# Patient Record
Sex: Female | Born: 1935 | Race: White | Hispanic: No | Smoking: Never smoker
Health system: Southern US, Community
[De-identification: ages and names within clinical notes are randomized; demographics above are authoritative.]

## PROBLEM LIST (undated history)

## (undated) DIAGNOSIS — I10 Essential (primary) hypertension: Secondary | ICD-10-CM

## (undated) DIAGNOSIS — K219 Gastro-esophageal reflux disease without esophagitis: Secondary | ICD-10-CM

## (undated) DIAGNOSIS — E8881 Metabolic syndrome: Secondary | ICD-10-CM

## (undated) DIAGNOSIS — H269 Unspecified cataract: Secondary | ICD-10-CM

## (undated) DIAGNOSIS — E039 Hypothyroidism, unspecified: Secondary | ICD-10-CM

## (undated) DIAGNOSIS — E785 Hyperlipidemia, unspecified: Secondary | ICD-10-CM

## (undated) DIAGNOSIS — N6019 Diffuse cystic mastopathy of unspecified breast: Secondary | ICD-10-CM

## (undated) DIAGNOSIS — K8020 Calculus of gallbladder without cholecystitis without obstruction: Secondary | ICD-10-CM

## (undated) DIAGNOSIS — M8580 Other specified disorders of bone density and structure, unspecified site: Secondary | ICD-10-CM

## (undated) DIAGNOSIS — K5790 Diverticulosis of intestine, part unspecified, without perforation or abscess without bleeding: Secondary | ICD-10-CM

## (undated) DIAGNOSIS — F411 Generalized anxiety disorder: Secondary | ICD-10-CM

## (undated) HISTORY — PX: CHOLECYSTECTOMY: SHX55

## (undated) HISTORY — PX: BREAST BIOPSY: SHX20

## (undated) HISTORY — DX: Gastro-esophageal reflux disease without esophagitis: K21.9

## (undated) HISTORY — DX: Calculus of gallbladder without cholecystitis without obstruction: K80.20

## (undated) HISTORY — DX: Metabolic syndrome: E88.810

## (undated) HISTORY — DX: Unspecified cataract: H26.9

## (undated) HISTORY — DX: Metabolic syndrome: E88.81

## (undated) HISTORY — DX: Hypothyroidism, unspecified: E03.9

## (undated) HISTORY — DX: Generalized anxiety disorder: F41.1

## (undated) HISTORY — DX: Other specified disorders of bone density and structure, unspecified site: M85.80

## (undated) HISTORY — PX: BREAST SURGERY: SHX581

## (undated) HISTORY — PX: OTHER SURGICAL HISTORY: SHX169

## (undated) HISTORY — DX: Diverticulosis of intestine, part unspecified, without perforation or abscess without bleeding: K57.90

## (undated) HISTORY — DX: Essential (primary) hypertension: I10

## (undated) HISTORY — DX: Hyperlipidemia, unspecified: E78.5

## (undated) HISTORY — DX: Diffuse cystic mastopathy of unspecified breast: N60.19

---

## 1997-11-26 ENCOUNTER — Encounter: Payer: Self-pay | Admitting: General Surgery

## 1997-11-27 ENCOUNTER — Ambulatory Visit (HOSPITAL_COMMUNITY): Admission: RE | Admit: 1997-11-27 | Discharge: 1997-11-27 | Payer: Self-pay | Admitting: General Surgery

## 1997-11-27 ENCOUNTER — Encounter: Payer: Self-pay | Admitting: General Surgery

## 1999-11-03 ENCOUNTER — Other Ambulatory Visit: Admission: RE | Admit: 1999-11-03 | Discharge: 1999-11-03 | Payer: Self-pay | Admitting: Family Medicine

## 2000-05-05 ENCOUNTER — Encounter: Admission: RE | Admit: 2000-05-05 | Discharge: 2000-05-05 | Payer: Self-pay | Admitting: Family Medicine

## 2000-05-05 ENCOUNTER — Encounter: Payer: Self-pay | Admitting: Family Medicine

## 2001-04-24 ENCOUNTER — Other Ambulatory Visit: Admission: RE | Admit: 2001-04-24 | Discharge: 2001-04-24 | Payer: Self-pay | Admitting: Family Medicine

## 2001-05-07 ENCOUNTER — Encounter: Admission: RE | Admit: 2001-05-07 | Discharge: 2001-05-07 | Payer: Self-pay | Admitting: Family Medicine

## 2001-05-07 ENCOUNTER — Encounter: Payer: Self-pay | Admitting: Family Medicine

## 2001-07-05 ENCOUNTER — Ambulatory Visit (HOSPITAL_COMMUNITY): Admission: RE | Admit: 2001-07-05 | Discharge: 2001-07-05 | Payer: Self-pay | Admitting: Family Medicine

## 2001-07-05 ENCOUNTER — Encounter: Payer: Self-pay | Admitting: Family Medicine

## 2001-08-10 ENCOUNTER — Encounter: Payer: Self-pay | Admitting: General Surgery

## 2001-08-16 ENCOUNTER — Encounter: Payer: Self-pay | Admitting: General Surgery

## 2001-08-16 ENCOUNTER — Observation Stay (HOSPITAL_COMMUNITY): Admission: RE | Admit: 2001-08-16 | Discharge: 2001-08-17 | Payer: Self-pay | Admitting: General Surgery

## 2001-08-16 ENCOUNTER — Encounter (INDEPENDENT_AMBULATORY_CARE_PROVIDER_SITE_OTHER): Payer: Self-pay | Admitting: Specialist

## 2002-06-25 ENCOUNTER — Other Ambulatory Visit: Admission: RE | Admit: 2002-06-25 | Discharge: 2002-06-25 | Payer: Self-pay | Admitting: Family Medicine

## 2002-07-22 ENCOUNTER — Encounter: Admission: RE | Admit: 2002-07-22 | Discharge: 2002-07-22 | Payer: Self-pay | Admitting: Family Medicine

## 2002-07-22 ENCOUNTER — Encounter: Payer: Self-pay | Admitting: Family Medicine

## 2002-08-23 ENCOUNTER — Encounter: Payer: Self-pay | Admitting: Internal Medicine

## 2002-08-23 ENCOUNTER — Ambulatory Visit (HOSPITAL_COMMUNITY): Admission: RE | Admit: 2002-08-23 | Discharge: 2002-08-23 | Payer: Self-pay | Admitting: Internal Medicine

## 2003-07-28 ENCOUNTER — Encounter: Admission: RE | Admit: 2003-07-28 | Discharge: 2003-07-28 | Payer: Self-pay | Admitting: Family Medicine

## 2003-08-25 ENCOUNTER — Other Ambulatory Visit: Admission: RE | Admit: 2003-08-25 | Discharge: 2003-08-25 | Payer: Self-pay | Admitting: Family Medicine

## 2004-03-19 ENCOUNTER — Ambulatory Visit (HOSPITAL_COMMUNITY): Admission: RE | Admit: 2004-03-19 | Discharge: 2004-03-19 | Payer: Self-pay | Admitting: Family Medicine

## 2004-08-30 ENCOUNTER — Other Ambulatory Visit: Admission: RE | Admit: 2004-08-30 | Discharge: 2004-08-30 | Payer: Self-pay | Admitting: Family Medicine

## 2004-10-12 ENCOUNTER — Encounter: Admission: RE | Admit: 2004-10-12 | Discharge: 2004-10-12 | Payer: Self-pay | Admitting: Family Medicine

## 2005-09-19 ENCOUNTER — Other Ambulatory Visit: Admission: RE | Admit: 2005-09-19 | Discharge: 2005-09-19 | Payer: Self-pay | Admitting: Family Medicine

## 2005-10-25 ENCOUNTER — Encounter: Admission: RE | Admit: 2005-10-25 | Discharge: 2005-10-25 | Payer: Self-pay | Admitting: Family Medicine

## 2006-11-01 ENCOUNTER — Encounter: Admission: RE | Admit: 2006-11-01 | Discharge: 2006-11-01 | Payer: Self-pay | Admitting: Family Medicine

## 2007-11-09 ENCOUNTER — Encounter: Admission: RE | Admit: 2007-11-09 | Discharge: 2007-11-09 | Payer: Self-pay | Admitting: Family Medicine

## 2007-11-14 ENCOUNTER — Encounter: Admission: RE | Admit: 2007-11-14 | Discharge: 2007-11-14 | Payer: Self-pay | Admitting: Family Medicine

## 2008-11-27 ENCOUNTER — Encounter: Admission: RE | Admit: 2008-11-27 | Discharge: 2008-11-27 | Payer: Self-pay | Admitting: Family Medicine

## 2009-11-30 ENCOUNTER — Encounter: Admission: RE | Admit: 2009-11-30 | Discharge: 2009-11-30 | Payer: Self-pay | Admitting: Family Medicine

## 2010-02-21 ENCOUNTER — Encounter: Payer: Self-pay | Admitting: Family Medicine

## 2010-06-18 NOTE — Op Note (Signed)
[facility]  Patient:    Janice Zamora, Janice Zamora Visit Number: [id number] MRN: [id number]          Service Type: SUR Location: 3W 0375 02 Attending Physician:  Arlis Porta Dictated by:   Adolph Pollack, M.D. Proc. Date: 08/16/01 Admit Date:  08/16/2001 Discharge Date: 08/17/2001   CC:         Janice Zamora, M.D.   Operative Report  PREOPERATIVE DIAGNOSIS:  Symptomatic cholelithiasis.  POSTOPERATIVE DIAGNOSIS:  Symptomatic cholelithiasis.  PROCEDURE:  Laparoscopic cholecystectomy with intraoperative cholangiogram.  SURGEON:  Adolph Pollack, M.D.  ASSISTANT:  Ollen Gross. Vernell Morgans, M.D.  ANESTHESIA:  General.  INDICATIONS FOR PROCEDURE:  Janice Zamora is a 75 year old female whose been having right upper quadrant pain with low grade fever and nausea. She was felt to have a urinary tract infection, was started on Cipro and also had an ultrasound which demonstrated multiple gallstones and a small renal and hepatic cyst. She still has intermittent pains in her right upper quadrant radiating to the back at times associated with nausea. She now presents for elective cholecystectomy. Liver function tests are normal preoperatively.  TECHNIQUE:  She was placed supine on the operating table and a general anesthetic was administered. Her abdomen was sterilely prepped and draped. Local anesthetic consisting of 0.5% plain Marcaine was infiltrated in the umbilical area and an incision made through the umbilicus and carried down through the subcutaneous tissue. The peritoneal cavity was entered sharply and under direct vision. A pursestring suture of #0 Vicryl was placed around the fascial edges. A Hasson trocar was introduced into the peritoneal cavity and pneumoperitoneum was created by insufflation of CO2 gas. Next the laparoscope was introduced. The patient was then placed in the appropriate position and under direct vision an 11 mm trocar was placed  through an epigastric incision and two 5 mm trocars were placed in the right mid lateral abdomen through small incisions. The fundus of the gallbladder was noted to be pale and it was grasped and retracted toward the right shoulder. Filmy adhesions the infundibulum were taken down. The infundibulum was then completely mobilized. I then isolated the gallbladder cystic duct junction. The cystic duct appeared to be quite short. I created a window around it. I then placed a clip up on the gallbladder proximal to the cystic duct gallbladder junction and made an incision at the cystic duct gallbladder junction. A cholangiocath was passed through the abdominal wall and a cholangiogram was performed.  Using real-time fluoroscopy, dilute contrast material was injected into the cystic duct. It promptly drained into the common bile duct which promptly drained into the duodenum. The common and left hepatic ducts were also noted. The short cystic duct was verified by way of the cholangiogram. The final report is pending the radiologists interpretation.  Next, I removed the cholangiocath, I placed two occlusive clips in the proximal cystic duct and divided it sharply. An anterior and posterior branch of the cystic artery were clipped and divided. The gallbladder was then dissected free from the liver bed using the cautery intact. The liver bed was irrigated, inspected and no bile leakage or bleeding was noted. Irrigation fluid is evacuated.  Under direct vision, the gallbladder was removed through the umbilical incision and the umbilical incision was closed by tightening up and tying down the pursestring suture under direct vision. Omental adhesions from a previous umbilical hernia repair were also noted inferior to this area.  Next, the perihepatic areas were reinspected and again  no bleeding or bile leakage was noted. The trocars were all removed and then the pneumoperitoneum was released. The  skin incisions were closed with 4-0 Monocryl subcuticular stitches. Steri-Strips and sterile dressings were applied.  The patient tolerated the procedure well without any apparent complications and was taken to the recovery room in satisfactory condition. Dictated by:   Adolph Pollack, M.D. Attending Physician:  Arlis Porta DD:  08/16/01 TD:  08/21/01 Job: [phone]

## 2010-11-03 ENCOUNTER — Other Ambulatory Visit: Payer: Self-pay | Admitting: Family Medicine

## 2010-11-03 DIAGNOSIS — Z1231 Encounter for screening mammogram for malignant neoplasm of breast: Secondary | ICD-10-CM

## 2010-12-07 ENCOUNTER — Ambulatory Visit
Admission: RE | Admit: 2010-12-07 | Discharge: 2010-12-07 | Disposition: A | Discharge status: Home / Self Care | Payer: Medicare Other | Source: Ambulatory Visit | Attending: Family Medicine | Admitting: Family Medicine

## 2010-12-07 DIAGNOSIS — Z1231 Encounter for screening mammogram for malignant neoplasm of breast: Secondary | ICD-10-CM

## 2011-11-15 ENCOUNTER — Other Ambulatory Visit: Payer: Self-pay | Admitting: Nurse Practitioner

## 2011-11-15 DIAGNOSIS — Z1231 Encounter for screening mammogram for malignant neoplasm of breast: Secondary | ICD-10-CM

## 2011-12-16 ENCOUNTER — Ambulatory Visit
Admission: RE | Admit: 2011-12-16 | Discharge: 2011-12-16 | Disposition: A | Discharge status: Home / Self Care | Payer: Medicare Other | Source: Ambulatory Visit | Attending: Nurse Practitioner | Admitting: Nurse Practitioner

## 2011-12-16 DIAGNOSIS — Z1231 Encounter for screening mammogram for malignant neoplasm of breast: Secondary | ICD-10-CM

## 2012-03-21 ENCOUNTER — Encounter: Payer: Self-pay | Admitting: Internal Medicine

## 2012-04-11 ENCOUNTER — Other Ambulatory Visit (INDEPENDENT_AMBULATORY_CARE_PROVIDER_SITE_OTHER): Payer: Medicare Other

## 2012-04-11 ENCOUNTER — Other Ambulatory Visit: Payer: Self-pay

## 2012-04-11 ENCOUNTER — Ambulatory Visit (INDEPENDENT_AMBULATORY_CARE_PROVIDER_SITE_OTHER): Payer: Medicare Other | Admitting: Internal Medicine

## 2012-04-11 ENCOUNTER — Encounter: Payer: Self-pay | Admitting: Internal Medicine

## 2012-04-11 VITALS — BP 124/60 | HR 76 | Ht 62.0 in | Wt 166.4 lb

## 2012-04-11 DIAGNOSIS — R195 Other fecal abnormalities: Secondary | ICD-10-CM

## 2012-04-11 DIAGNOSIS — K219 Gastro-esophageal reflux disease without esophagitis: Secondary | ICD-10-CM

## 2012-04-11 LAB — CBC WITH DIFFERENTIAL/PLATELET
Basophils Relative: 0.7 % (ref 0.0–3.0)
Lymphocytes Relative: 21.5 % (ref 12.0–46.0)
Lymphs Abs: 1.4 10*3/uL (ref 0.7–4.0)
MCV: 93.2 fl (ref 78.0–100.0)
Monocytes Absolute: 0.5 10*3/uL (ref 0.1–1.0)
Monocytes Relative: 8.2 % (ref 3.0–12.0)
Neutro Abs: 4.5 10*3/uL (ref 1.4–7.7)
Neutrophils Relative %: 68.7 % (ref 43.0–77.0)
RBC: 4.31 Mil/uL (ref 3.87–5.11)
WBC: 6.6 10*3/uL (ref 4.5–10.5)

## 2012-04-11 MED ORDER — MOVIPREP 100 G PO SOLR: 1.0000 | Freq: Once | ORAL | Status: DC

## 2012-04-11 MED ORDER — METOCLOPRAMIDE HCL 10 MG PO TABS: ORAL_TABLET | ORAL | Status: DC

## 2012-04-11 NOTE — Progress Notes (Signed)
HISTORY OF PRESENT ILLNESS:  Janice Zamora is a 77 y.o. female with the below listed medical history who presents today regarding Hemoccult-positive stool. She has a history of GERD for which she takes PPI. On PPI no symptoms. Off PPI GERD symptoms. She underwent routine evaluation with her primary care team. As part of that, Hemoccult study was admitted and returned positive 03/14/2012. Other laboratories were unremarkable except for lipids. No CBC obtained. The patient rarely uses NSAIDs. Occasional red blood with wiping and straining. GI review of systems is otherwise negative. She did undergo routine colonoscopy to evaluate hematochezia in July 2004. The examination was incomplete as the scope was unable to pass the sigmoid region. Internal hemorrhoids noted. Subsequent barium enema revealed sigmoid diverticulosis and tortuosity of the sigmoid and splenic flexure regions. The patient's chronic medical problems are stable. Outside studies and office notes have been reviewed. The patient is accompanied today by her daughter. Previous endoscopy and radiology reports also reviewed. She is status post cholecystectomy since her last visit  REVIEW OF SYSTEMS:  All non-GI ROS negative after extensive review  Past Medical History  Diagnosis Date  . Hypertension   . Hypothyroidism   . GERD (gastroesophageal reflux disease)   . Fibrocystic breast disease   . Hyperlipidemia   . Metabolic syndrome   . GAD (generalized anxiety disorder)   . Diverticulosis   . Cholelithiasis     Past Surgical History  Procedure Laterality Date  . Cholecystectomy      Social History Janice Zamora  reports that she has never smoked. She has never used smokeless tobacco. She reports that she does not drink alcohol or use illicit drugs.  family history includes Hypertension in her father and mother.  No Known Allergies     PHYSICAL EXAMINATION: Vital signs: BP 124/60  Pulse 76  Ht 5\' 2"  (1.575 m)  Wt 166  lb 6 oz (75.467 kg)  BMI 30.42 kg/m2  Constitutional: generally well-appearing, no acute distress Psychiatric: alert and oriented x3, cooperative Eyes: extraocular movements intact, anicteric, conjunctiva pink Mouth: oral pharynx moist, no lesions Neck: supple no lymphadenopathy Cardiovascular: heart regular rate and rhythm, no murmur Lungs: clear to auscultation bilaterally Abdomen: soft, nontender, nondistended, no obvious ascites, no peritoneal signs, normal bowel sounds, no organomegaly Rectal: Deferred until colonoscopy Extremities: no lower extremity edema bilaterally Skin: no lesions on visible extremities Neuro: No focal deficits.     ASSESSMENT:  #1. Hemoccult-positive stool #2. GERD. Symptoms controlled with PPI #3. General medical problems. Stable   PLAN:  #1. Colonoscopy. This to evaluate Hemoccult-positive stool. As well, 10 years since her last exam. Would use pediatric colonoscope given prior difficulties.The nature of the procedure, as well as the risks, benefits, and alternatives were carefully and thoroughly reviewed with the patient. Ample time for discussion and questions allowed. The patient understood, was satisfied, and agreed to proceed. #2. Movi prep prescribed. The patient instructed on its use #3. Obtain CBC today. If anemic, will likely need upper endoscopy. #4. Continue PPI and reflux precautions

## 2012-04-11 NOTE — Patient Instructions (Addendum)
You have been scheduled for a colonoscopy with propofol. Please follow written instructions given to you at your visit today.  Please pick up your prep kit at the pharmacy within the next 1-3 days. If you use inhalers (even only as needed), please bring them with you on the day of your procedure.   Your physician has requested that you go to the basement for the following lab work before leaving today:  cbc

## 2012-04-17 ENCOUNTER — Other Ambulatory Visit: Payer: Self-pay | Admitting: Internal Medicine

## 2012-04-17 ENCOUNTER — Ambulatory Visit (AMBULATORY_SURGERY_CENTER): Payer: Medicare Other | Admitting: Internal Medicine

## 2012-04-17 ENCOUNTER — Encounter: Payer: Self-pay | Admitting: Internal Medicine

## 2012-04-17 VITALS — BP 127/54 | HR 74 | Temp 97.8°F | Resp 14 | Ht 62.0 in | Wt 166.0 lb

## 2012-04-17 DIAGNOSIS — D128 Benign neoplasm of rectum: Secondary | ICD-10-CM

## 2012-04-17 DIAGNOSIS — D126 Benign neoplasm of colon, unspecified: Secondary | ICD-10-CM

## 2012-04-17 DIAGNOSIS — K5730 Diverticulosis of large intestine without perforation or abscess without bleeding: Secondary | ICD-10-CM

## 2012-04-17 DIAGNOSIS — R195 Other fecal abnormalities: Secondary | ICD-10-CM

## 2012-04-17 DIAGNOSIS — D129 Benign neoplasm of anus and anal canal: Secondary | ICD-10-CM

## 2012-04-17 DIAGNOSIS — K56609 Unspecified intestinal obstruction, unspecified as to partial versus complete obstruction: Secondary | ICD-10-CM

## 2012-04-17 MED ORDER — SODIUM CHLORIDE 0.9 % IV SOLN: 500.0000 mL | INTRAVENOUS | Status: DC

## 2012-04-17 NOTE — Op Note (Signed)
 [facility] [address] [place], [address]   COLONOSCOPY PROCEDURE REPORT  PATIENT: Janice, Zamora  MR#: [id number] BIRTHDATE: [birth year] , 77  yrs. old GENDER: Female ENDOSCOPIST: Roxy Cedar, MD REFERRED UU:VOZD Sheron Nightingale, N.P. PROCEDURE DATE:  04/17/2012 PROCEDURE:   Colonoscopy with snare polypectomy    x 1 ASA CLASS:   Class II INDICATIONS:heme-positive stool.   Incomplete exam followed by BE 2004 MEDICATIONS: MAC sedation, administered by CRNA and propofol (Diprivan) 300mg  IV  DESCRIPTION OF PROCEDURE:   After the risks benefits and alternatives of the procedure were thoroughly explained, informed consent was obtained.  A digital rectal exam revealed no abnormalities of the rectum.   The LB [id number]-[id number]  endoscope was introduced through the anus and advanced to the sigmoid colon. Limited by a stricture.   The quality of the prep was excellent, using MoviPrep  The instrument was then slowly withdrawn as the colon was fully examined.      COLON FINDINGS: A single polyp measuring 8 mm in size was found in the rectum.  A polypectomy was performed using snare cautery.  The resection was complete and the polyp tissue was completely retrieved.   Moderate diverticulosis was noted in the sigmoid colon. THERE WAS MARKED STENSOSIS OF THE RECTOSIGMOID JUNCTION THAT WOULD NOT ALLOW PASSAGE OF THE PEDIATRIC COLONOSCOPE OR THE UPPER ENDOSCOPE. Retroflexed views revealed no abnormalities. The time to cecum=  .  Withdrawal time=  .  The scope was withdrawn and the procedure completed. COMPLICATIONS: There were no complications.  ENDOSCOPIC IMPRESSION: 1.   Single polyp measuring 8 mm in size was found in the rectum; polypectomy was performed using snare cautery 2.   Moderate diverticulosis was noted in the sigmoid colon 3. INCOMPLETE EXAM SECONDARY TO RECTOSIGMOID STENOSIS  RECOMMENDATIONS: 1. Await pathology results 2. MY OFFICE WILL ARRANGE A  VIRTUAL COLONOSCOPY TO EVALUATE UNVISUALIZED COLON "HEME + STOOL, SIGMOID OBSTRUCTION"   eSigned:  Roxy Cedar, MD 04/17/2012 10:44 AM   cc: Bennie Pierini, NP and The Patient

## 2012-04-17 NOTE — Patient Instructions (Addendum)

## 2012-04-17 NOTE — Progress Notes (Signed)
Called to room to assist during endoscopic procedure.  Patient ID and intended procedure confirmed with present staff. Received instructions for my participation in the procedure from the performing physician.  

## 2012-04-17 NOTE — Progress Notes (Signed)
Patient did not experience any of the following events: a burn prior to discharge; a fall within the facility; wrong site/side/patient/procedure/implant event; or a hospital transfer or hospital admission upon discharge from the facility. (G8907) Patient did not have preoperative order for IV antibiotic SSI prophylaxis. (G8918)  

## 2012-04-18 ENCOUNTER — Telehealth: Payer: Self-pay

## 2012-04-18 NOTE — Telephone Encounter (Signed)
  Follow up Call-  Call back number 04/17/2012  Post procedure Call Back phone  # [phone]  Permission to leave phone message Yes     Patient questions:  Do you have a fever, pain , or abdominal swelling? no Pain Score  0 *  Have you tolerated food without any problems? yes  Have you been able to return to your normal activities? yes  Do you have any questions about your discharge instructions: Diet   no Medications  yes Follow up visit  no  Do you have questions or concerns about your Care? no  Actions: * If pain score is 4 or above: No action needed, pain <4.

## 2012-04-23 ENCOUNTER — Encounter: Payer: Self-pay | Admitting: Internal Medicine

## 2012-05-02 ENCOUNTER — Ambulatory Visit
Admission: RE | Admit: 2012-05-02 | Discharge: 2012-05-02 | Disposition: A | Discharge status: Home / Self Care | Payer: Medicare Other | Source: Ambulatory Visit | Attending: Internal Medicine | Admitting: Internal Medicine

## 2012-05-02 DIAGNOSIS — K56609 Unspecified intestinal obstruction, unspecified as to partial versus complete obstruction: Secondary | ICD-10-CM

## 2012-07-03 ENCOUNTER — Ambulatory Visit (INDEPENDENT_AMBULATORY_CARE_PROVIDER_SITE_OTHER): Payer: Medicare Other | Admitting: Nurse Practitioner

## 2012-07-03 ENCOUNTER — Encounter: Payer: Self-pay | Admitting: Nurse Practitioner

## 2012-07-03 VITALS — BP 134/69 | HR 59 | Temp 97.0°F | Ht 62.0 in | Wt 163.0 lb

## 2012-07-03 DIAGNOSIS — F411 Generalized anxiety disorder: Secondary | ICD-10-CM

## 2012-07-03 DIAGNOSIS — E039 Hypothyroidism, unspecified: Secondary | ICD-10-CM

## 2012-07-03 DIAGNOSIS — E785 Hyperlipidemia, unspecified: Secondary | ICD-10-CM

## 2012-07-03 DIAGNOSIS — K219 Gastro-esophageal reflux disease without esophagitis: Secondary | ICD-10-CM

## 2012-07-03 DIAGNOSIS — I10 Essential (primary) hypertension: Secondary | ICD-10-CM

## 2012-07-03 LAB — COMPLETE METABOLIC PANEL WITH GFR
ALT: 21 U/L (ref 0–35)
CO2: 27 mEq/L (ref 19–32)
Calcium: 9.9 mg/dL (ref 8.4–10.5)
Chloride: 100 mEq/L (ref 96–112)
GFR, Est African American: 67 mL/min
Potassium: 4.1 mEq/L (ref 3.5–5.3)
Sodium: 137 mEq/L (ref 135–145)
Total Protein: 6.9 g/dL (ref 6.0–8.3)

## 2012-07-03 LAB — THYROID PANEL WITH TSH
Free Thyroxine Index: 3 (ref 1.0–3.9)
T4, Total: 9.7 ug/dL (ref 5.0–12.5)
TSH: 1.701 u[IU]/mL (ref 0.350–4.500)

## 2012-07-03 MED ORDER — ALPRAZOLAM 0.5 MG PO TABS: 0.5000 mg | ORAL_TABLET | Freq: Two times a day (BID) | ORAL | Status: DC | PRN

## 2012-07-03 MED ORDER — ATENOLOL 50 MG PO TABS: 50.0000 mg | ORAL_TABLET | Freq: Every day | ORAL | Status: DC

## 2012-07-03 MED ORDER — ENALAPRIL-HYDROCHLOROTHIAZIDE 10-25 MG PO TABS: 1.0000 | ORAL_TABLET | Freq: Every day | ORAL | Status: DC

## 2012-07-03 MED ORDER — SIMVASTATIN 40 MG PO TABS: 40.0000 mg | ORAL_TABLET | Freq: Every evening | ORAL | Status: DC

## 2012-07-03 MED ORDER — OMEPRAZOLE 20 MG PO CPDR: 20.0000 mg | DELAYED_RELEASE_CAPSULE | Freq: Every day | ORAL | Status: DC

## 2012-07-03 NOTE — Patient Instructions (Signed)

## 2012-07-03 NOTE — Progress Notes (Signed)
Subjective:    Patient ID: Janice Zamora, female    DOB: [birth year], 77 y.o.   MRN: [id number]  Hypertension This is a chronic problem. The current episode started more than 1 year ago. The problem is controlled. Associated symptoms include anxiety. Pertinent negatives include no blurred vision, chest pain, headaches, malaise/fatigue, neck pain, palpitations, peripheral edema or shortness of breath. There are no associated agents to hypertension. Risk factors for coronary artery disease include dyslipidemia, obesity, post-menopausal state and sedentary lifestyle. Past treatments include ACE inhibitors, diuretics and beta blockers. The current treatment provides significant improvement. Compliance problems include diet and exercise.  Hypertensive end-organ damage includes a thyroid problem.  Hyperlipidemia This is a chronic problem. The current episode started more than 1 year ago. The problem is uncontrolled. Recent lipid tests were reviewed and are high. Exacerbating diseases include hypothyroidism and obesity. There are no known factors aggravating her hyperlipidemia. Pertinent negatives include no chest pain or shortness of breath. Current antihyperlipidemic treatment includes statins. The current treatment provides moderate improvement of lipids. Compliance problems include adherence to diet and adherence to exercise.  Risk factors for coronary artery disease include hypertension, obesity, post-menopausal and a sedentary lifestyle.  Thyroid Problem Presents for follow-up visit. Symptoms include anxiety and dry skin. Patient reports no constipation, depressed mood, fatigue, hair loss, palpitations, weight gain or weight loss. The symptoms have been stable. Her past medical history is significant for hyperlipidemia.  GAD Xanax takes prn-2-3X per week mainly at night when she can't sleep   Review of Systems  Constitutional: Negative for weight loss, weight gain, malaise/fatigue and fatigue.  HENT:  Negative for neck pain.   Eyes: Negative for blurred vision.  Respiratory: Negative for shortness of breath.   Cardiovascular: Negative for chest pain and palpitations.  Gastrointestinal: Negative for constipation.  Neurological: Negative for headaches.  All other systems reviewed and are negative.       Objective:   Physical Exam  Constitutional: She is oriented to person, place, and time. She appears well-developed and well-nourished.  HENT:  Nose: Nose normal.  Mouth/Throat: Oropharynx is clear and moist.  Eyes: EOM are normal.  Neck: Trachea normal, normal range of motion and full passive range of motion without pain. Neck supple. No JVD present. Carotid bruit is not present. No thyromegaly present.  Cardiovascular: Normal rate, regular rhythm, normal heart sounds and intact distal pulses.  Exam reveals no gallop and no friction rub.   No murmur heard. Pulmonary/Chest: Effort normal and breath sounds normal.  Abdominal: Soft. Bowel sounds are normal. She exhibits no distension and no mass. There is no tenderness.  Musculoskeletal: Normal range of motion.  Lymphadenopathy:    She has no cervical adenopathy.  Neurological: She is alert and oriented to person, place, and time. She has normal reflexes.  Skin: Skin is warm and dry.  Psychiatric: She has a normal mood and affect. Her behavior is normal. Judgment and thought content normal.    BP 134/69  Pulse 59  Temp(Src) 97 F (36.1 C) (Oral)  Ht 5\' 2"  (1.575 m)  Wt 163 lb (73.936 kg)  BMI 29.81 kg/m2       Assessment & Plan:  1. Hypertension Low NA+ diet - atenolol (TENORMIN) 50 MG tablet; Take 1 tablet (50 mg total) by mouth daily.  Dispense: 30 tablet; Refill: 5 - enalapril-hydrochlorothiazide (VASERETIC) 10-25 MG per tablet; Take 1 tablet by mouth daily.  Dispense: 30 tablet; Refill: 5 - COMPLETE METABOLIC PANEL WITH GFR  2. Hypothyroidism  -  Thyroid Panel With TSH  3. GERD (gastroesophageal reflux  disease) Avoid spicy and fatty foods - omeprazole (PRILOSEC) 20 MG capsule; Take 1 capsule (20 mg total) by mouth daily.  Dispense: 30 capsule; Refill: 5  4. Hyperlipidemia Low fat diet and exercise - simvastatin (ZOCOR) 40 MG tablet; Take 1 tablet (40 mg total) by mouth every evening.  Dispense: 30 tablet; Refill: 5 - NMR Lipoprofile with Lipids  5. GAD (generalized anxiety disorder) Stress management - ALPRAZolam (XANAX) 0.5 MG tablet; Take 1 tablet (0.5 mg total) by mouth 2 (two) times daily as needed.  Dispense: 60 tablet; Refill: 0   Mary-Margaret Daphine Deutscher, FNP

## 2012-07-04 ENCOUNTER — Other Ambulatory Visit: Payer: Self-pay | Admitting: Nurse Practitioner

## 2012-07-04 LAB — NMR LIPOPROFILE WITH LIPIDS
Cholesterol, Total: 158 mg/dL (ref <200)
HDL Particle Number: 34.6 umol/L (ref >=30.5)
LDL Size: 19.9 nm — ABNORMAL LOW (ref >20.5)
Large HDL-P: <1.3 umol/L — ABNORMAL LOW (ref >=4.8)
Small LDL Particle Number: 1060 nmol/L — ABNORMAL HIGH (ref <=527)
Triglycerides: 198 mg/dL — ABNORMAL HIGH (ref <150)
VLDL Size: 45.5 nm (ref <=46.6)

## 2012-07-04 MED ORDER — ATORVASTATIN CALCIUM 40 MG PO TABS: 40.0000 mg | ORAL_TABLET | Freq: Every day | ORAL | Status: DC

## 2012-09-26 ENCOUNTER — Encounter: Payer: Self-pay | Admitting: *Deleted

## 2012-09-28 ENCOUNTER — Telehealth: Payer: Self-pay | Admitting: Nurse Practitioner

## 2012-10-04 NOTE — Telephone Encounter (Signed)
Spoke with patient and appt has already been made

## 2012-10-10 ENCOUNTER — Ambulatory Visit: Payer: Medicare Other | Admitting: Nurse Practitioner

## 2012-10-16 ENCOUNTER — Ambulatory Visit: Payer: Medicare Other | Admitting: Nurse Practitioner

## 2012-11-02 ENCOUNTER — Ambulatory Visit (INDEPENDENT_AMBULATORY_CARE_PROVIDER_SITE_OTHER): Payer: Medicare Other | Admitting: Nurse Practitioner

## 2012-11-02 ENCOUNTER — Encounter: Payer: Self-pay | Admitting: Nurse Practitioner

## 2012-11-02 VITALS — BP 122/65 | HR 62 | Temp 97.7°F | Ht 62.0 in | Wt 166.0 lb

## 2012-11-02 DIAGNOSIS — I10 Essential (primary) hypertension: Secondary | ICD-10-CM

## 2012-11-02 DIAGNOSIS — E785 Hyperlipidemia, unspecified: Secondary | ICD-10-CM

## 2012-11-02 DIAGNOSIS — E039 Hypothyroidism, unspecified: Secondary | ICD-10-CM

## 2012-11-02 DIAGNOSIS — Z23 Encounter for immunization: Secondary | ICD-10-CM

## 2012-11-02 DIAGNOSIS — K219 Gastro-esophageal reflux disease without esophagitis: Secondary | ICD-10-CM

## 2012-11-02 DIAGNOSIS — F411 Generalized anxiety disorder: Secondary | ICD-10-CM

## 2012-11-02 MED ORDER — OMEPRAZOLE 20 MG PO CPDR: 20.0000 mg | DELAYED_RELEASE_CAPSULE | Freq: Every day | ORAL | Status: DC

## 2012-11-02 MED ORDER — LEVOTHYROXINE SODIUM 50 MCG PO TABS: 50.0000 ug | ORAL_TABLET | Freq: Every day | ORAL | Status: DC

## 2012-11-02 MED ORDER — ENALAPRIL-HYDROCHLOROTHIAZIDE 10-25 MG PO TABS: 1.0000 | ORAL_TABLET | Freq: Every day | ORAL | Status: DC

## 2012-11-02 MED ORDER — ATENOLOL 50 MG PO TABS: 50.0000 mg | ORAL_TABLET | Freq: Every day | ORAL | Status: DC

## 2012-11-02 MED ORDER — ATORVASTATIN CALCIUM 40 MG PO TABS: 40.0000 mg | ORAL_TABLET | Freq: Every day | ORAL | Status: DC

## 2012-11-02 NOTE — Patient Instructions (Addendum)

## 2012-11-02 NOTE — Progress Notes (Signed)
Subjective:    Patient ID: Janice Zamora, female    DOB: 1935-05-01, 77 y.o.   MRN: [id number]  Hypertension This is a chronic problem. The current episode started more than 1 year ago. The problem has been resolved since onset. The problem is controlled. Associated symptoms include anxiety. Pertinent negatives include no blurred vision, chest pain, headaches, malaise/fatigue, neck pain, palpitations, peripheral edema or shortness of breath. There are no associated agents to hypertension. Risk factors for coronary artery disease include dyslipidemia, obesity, post-menopausal state and sedentary lifestyle. Past treatments include ACE inhibitors, diuretics and beta blockers. The current treatment provides significant improvement. Compliance problems include diet and exercise.  Hypertensive end-organ damage includes a thyroid problem. There is no history of retinopathy. There is no history of sleep apnea.  Hyperlipidemia This is a chronic problem. The current episode started more than 1 year ago. The problem is uncontrolled. Recent lipid tests were reviewed and are high. Exacerbating diseases include hypothyroidism and obesity. There are no known factors aggravating her hyperlipidemia. Pertinent negatives include no chest pain or shortness of breath. Current antihyperlipidemic treatment includes statins (changed from zocor to lipitor at last visit.). The current treatment provides moderate improvement of lipids. Compliance problems include adherence to diet and adherence to exercise.  Risk factors for coronary artery disease include hypertension, obesity, post-menopausal, a sedentary lifestyle and dyslipidemia.  Thyroid Problem Presents for follow-up visit. Symptoms include anxiety, dry skin and fatigue. Patient reports no constipation, depressed mood, hair loss, palpitations, weight gain or weight loss. The symptoms have been stable. Past treatments include levothyroxine. The treatment provided moderate  relief. Her past medical history is significant for hyperlipidemia.  GAD Xanax takes prn-2-3X per week mainly at night when she can't sleep   Review of Systems  Constitutional: Positive for fatigue. Negative for weight loss, weight gain and malaise/fatigue.  HENT: Negative for neck pain.   Eyes: Negative for blurred vision.  Respiratory: Negative for shortness of breath.   Cardiovascular: Negative for chest pain and palpitations.  Gastrointestinal: Negative for constipation.  Neurological: Negative for headaches.  All other systems reviewed and are negative.       Objective:   Physical Exam  Vitals reviewed. Constitutional: She is oriented to person, place, and time. She appears well-developed and well-nourished.  HENT:  Nose: Nose normal.  Mouth/Throat: Oropharynx is clear and moist.  Eyes: EOM are normal.  Neck: Trachea normal, normal range of motion and full passive range of motion without pain. Neck supple. No JVD present. Carotid bruit is not present. No thyromegaly present.  Cardiovascular: Normal rate, regular rhythm, normal heart sounds and intact distal pulses.  Exam reveals no gallop and no friction rub.   No murmur heard. Pulmonary/Chest: Effort normal and breath sounds normal.  Abdominal: Soft. Bowel sounds are normal. She exhibits no distension and no mass. There is no tenderness.  Musculoskeletal: Normal range of motion.  Lymphadenopathy:    She has no cervical adenopathy.  Neurological: She is alert and oriented to person, place, and time. She has normal reflexes.  Skin: Skin is warm and dry.  Psychiatric: She has a normal mood and affect. Her behavior is normal. Judgment and thought content normal.    BP 122/65  Pulse 62  Temp(Src) 97.7 F (36.5 C) (Oral)  Ht 5\' 2"  (1.575 m)  Wt 166 lb (75.297 kg)  BMI 30.35 kg/m2       Assessment & Plan:   1. Hypertension   2. GERD (gastroesophageal reflux disease)   3.  Hypothyroidism   4. Hyperlipidemia   5.  GAD (generalized anxiety disorder)    Orders Placed This Encounter  Procedures  . CMP14+EGFR  . NMR, lipoprofile   Meds ordered this encounter  Medications  . atorvastatin (LIPITOR) 40 MG tablet    Sig: Take 1 tablet (40 mg total) by mouth daily.    Dispense:  30 tablet    Refill:  3    Order Specific Question:  Supervising Provider    Answer:  Ernestina Penna [[id number]]  . atenolol (TENORMIN) 50 MG tablet    Sig: Take 1 tablet (50 mg total) by mouth daily.    Dispense:  30 tablet    Refill:  5    Order Specific Question:  Supervising Provider    Answer:  Ernestina Penna [[id number]]  . levothyroxine (SYNTHROID) 50 MCG tablet    Sig: Take 1 tablet (50 mcg total) by mouth daily. Take 11/2 tablets daily    Dispense:  45 tablet    Refill:  5    Order Specific Question:  Supervising Provider    Answer:  Ernestina Penna [[id number]]  . omeprazole (PRILOSEC) 20 MG capsule    Sig: Take 1 capsule (20 mg total) by mouth daily.    Dispense:  30 capsule    Refill:  5    Order Specific Question:  Supervising Provider    Answer:  Ernestina Penna [[id number]]  . enalapril-hydrochlorothiazide (VASERETIC) 10-25 MG per tablet    Sig: Take 1 tablet by mouth daily.    Dispense:  30 tablet    Refill:  5    Order Specific Question:  Supervising Provider    Answer:  Deborra Medina    Continue all meds Labs pending Diet and exercise encouraged Health maintenance reviewed Follow up in 3 months Flu shot today- refuses all other immunizations  Mary-Margaret Daphine Deutscher, FNP

## 2012-11-03 LAB — CMP14+EGFR
ALT: 24 IU/L (ref 0–32)
Albumin: 4.7 g/dL (ref 3.5–4.8)
BUN: 18 mg/dL (ref 8–27)
CO2: 26 mmol/L (ref 18–29)
Calcium: 10.1 mg/dL (ref 8.6–10.2)
Chloride: 95 mmol/L — ABNORMAL LOW (ref 97–108)
GFR calc Af Amer: 61 mL/min/{1.73_m2} (ref >59)
Glucose: 127 mg/dL — ABNORMAL HIGH (ref 65–99)
Potassium: 4.3 mmol/L (ref 3.5–5.2)
Total Bilirubin: 0.5 mg/dL (ref 0.0–1.2)
Total Protein: 6.8 g/dL (ref 6.0–8.5)

## 2012-11-03 LAB — NMR, LIPOPROFILE
HDL Cholesterol by NMR: 39 mg/dL — ABNORMAL LOW (ref >=40)
LDL Particle Number: 1563 nmol/L — ABNORMAL HIGH (ref <1000)
LDL Size: 19.8 nm — ABNORMAL LOW (ref >20.5)
LDLC SERPL CALC-MCNC: 77 mg/dL (ref <100)
Small LDL Particle Number: 1185 nmol/L — ABNORMAL HIGH (ref <=527)
Triglycerides by NMR: 176 mg/dL — ABNORMAL HIGH (ref <150)

## 2012-11-05 ENCOUNTER — Other Ambulatory Visit: Payer: Self-pay | Admitting: Nurse Practitioner

## 2012-11-05 MED ORDER — EZETIMIBE 10 MG PO TABS: 10.0000 mg | ORAL_TABLET | Freq: Every day | ORAL | Status: DC

## 2012-11-07 ENCOUNTER — Other Ambulatory Visit: Payer: Self-pay | Admitting: Family Medicine

## 2012-11-14 ENCOUNTER — Telehealth: Payer: Self-pay | Admitting: Nurse Practitioner

## 2012-11-15 NOTE — Telephone Encounter (Signed)
LM on vm, atorvastatin sent in. Call if further questions.

## 2012-11-16 ENCOUNTER — Other Ambulatory Visit: Payer: Self-pay

## 2012-11-16 DIAGNOSIS — Z1231 Encounter for screening mammogram for malignant neoplasm of breast: Secondary | ICD-10-CM

## 2012-12-21 ENCOUNTER — Ambulatory Visit
Admission: RE | Admit: 2012-12-21 | Discharge: 2012-12-21 | Disposition: A | Discharge status: Home / Self Care | Payer: Medicare Other | Source: Ambulatory Visit

## 2012-12-21 DIAGNOSIS — Z1231 Encounter for screening mammogram for malignant neoplasm of breast: Secondary | ICD-10-CM

## 2013-01-04 ENCOUNTER — Other Ambulatory Visit: Payer: Self-pay | Admitting: Nurse Practitioner

## 2013-01-09 ENCOUNTER — Telehealth: Payer: Self-pay | Admitting: Nurse Practitioner

## 2013-01-09 DIAGNOSIS — F411 Generalized anxiety disorder: Secondary | ICD-10-CM

## 2013-01-09 MED ORDER — ALPRAZOLAM 0.5 MG PO TABS: 0.5000 mg | ORAL_TABLET | Freq: Two times a day (BID) | ORAL | Status: DC | PRN

## 2013-01-09 NOTE — Telephone Encounter (Signed)
Please call in xanax 0.5mg  1 po BID prn #[phone] refills

## 2013-01-09 NOTE — Telephone Encounter (Signed)
Refill request

## 2013-01-10 NOTE — Telephone Encounter (Signed)
LM,script called in.

## 2013-02-04 ENCOUNTER — Other Ambulatory Visit: Payer: Self-pay | Admitting: Nurse Practitioner

## 2013-02-19 ENCOUNTER — Encounter: Payer: Self-pay | Admitting: Nurse Practitioner

## 2013-02-19 ENCOUNTER — Ambulatory Visit (INDEPENDENT_AMBULATORY_CARE_PROVIDER_SITE_OTHER): Payer: Medicare Other | Admitting: Nurse Practitioner

## 2013-02-19 VITALS — BP 134/67 | HR 64 | Temp 96.8°F | Ht 62.0 in | Wt 168.0 lb

## 2013-02-19 DIAGNOSIS — E785 Hyperlipidemia, unspecified: Secondary | ICD-10-CM

## 2013-02-19 DIAGNOSIS — K219 Gastro-esophageal reflux disease without esophagitis: Secondary | ICD-10-CM

## 2013-02-19 DIAGNOSIS — R7309 Other abnormal glucose: Secondary | ICD-10-CM

## 2013-02-19 DIAGNOSIS — F411 Generalized anxiety disorder: Secondary | ICD-10-CM

## 2013-02-19 DIAGNOSIS — R739 Hyperglycemia, unspecified: Secondary | ICD-10-CM

## 2013-02-19 DIAGNOSIS — E039 Hypothyroidism, unspecified: Secondary | ICD-10-CM

## 2013-02-19 DIAGNOSIS — I10 Essential (primary) hypertension: Secondary | ICD-10-CM

## 2013-02-19 MED ORDER — ATORVASTATIN CALCIUM 40 MG PO TABS: 40.0000 mg | ORAL_TABLET | Freq: Every day | ORAL | Status: DC

## 2013-02-19 NOTE — Progress Notes (Addendum)
Subjective:    Patient ID: Janice Zamora, female    DOB: 01-Jan-1936, 78 y.o.   MRN: [id number]   Patient here today for follow up- she is doing well without complaints  Hypertension This is a chronic problem. The current episode started more than 1 year ago. The problem has been resolved since onset. The problem is controlled. Associated symptoms include anxiety. Pertinent negatives include no blurred vision, chest pain, headaches, malaise/fatigue, neck pain, palpitations, peripheral edema or shortness of breath. There are no associated agents to hypertension. Risk factors for coronary artery disease include dyslipidemia, obesity, post-menopausal state and sedentary lifestyle. Past treatments include ACE inhibitors, diuretics and beta blockers. The current treatment provides significant improvement. Compliance problems include diet and exercise.  Hypertensive end-organ damage includes a thyroid problem. There is no history of retinopathy. There is no history of sleep apnea.  Hyperlipidemia This is a chronic problem. The current episode started more than 1 year ago. The problem is uncontrolled. Recent lipid tests were reviewed and are high. Exacerbating diseases include hypothyroidism and obesity. There are no known factors aggravating her hyperlipidemia. Pertinent negatives include no chest pain or shortness of breath. Current antihyperlipidemic treatment includes statins (changed from zocor to lipitor at last visit.). The current treatment provides moderate improvement of lipids. Compliance problems include adherence to diet and adherence to exercise.  Risk factors for coronary artery disease include hypertension, obesity, post-menopausal, a sedentary lifestyle and dyslipidemia.  Thyroid Problem Presents for follow-up visit. Symptoms include anxiety, dry skin and fatigue. Patient reports no constipation, depressed mood, hair loss, palpitations, weight gain or weight loss. The symptoms have been stable.  Past treatments include levothyroxine. The treatment provided moderate relief. Her past medical history is significant for hyperlipidemia.  GAD Xanax takes prn-2-3X per week mainly at night when she can't sleep   Review of Systems  Constitutional: Positive for fatigue. Negative for weight loss, weight gain and malaise/fatigue.  Eyes: Negative for blurred vision.  Respiratory: Negative for shortness of breath.   Cardiovascular: Negative for chest pain and palpitations.  Gastrointestinal: Negative for constipation.  Musculoskeletal: Negative for neck pain.  Neurological: Negative for headaches.  All other systems reviewed and are negative.       Objective:   Physical Exam  Vitals reviewed. Constitutional: She is oriented to person, place, and time. She appears well-developed and well-nourished.  HENT:  Nose: Nose normal.  Mouth/Throat: Oropharynx is clear and moist.  Eyes: EOM are normal.  Neck: Trachea normal, normal range of motion and full passive range of motion without pain. Neck supple. No JVD present. Carotid bruit is not present. No thyromegaly present.  Cardiovascular: Normal rate, regular rhythm, normal heart sounds and intact distal pulses.  Exam reveals no gallop and no friction rub.   No murmur heard. Pulmonary/Chest: Effort normal and breath sounds normal.  Abdominal: Soft. Bowel sounds are normal. She exhibits no distension and no mass. There is no tenderness.  Musculoskeletal: Normal range of motion.  Lymphadenopathy:    She has no cervical adenopathy.  Neurological: She is alert and oriented to person, place, and time. She has normal reflexes.  Skin: Skin is warm and dry.  Psychiatric: She has a normal mood and affect. Her behavior is normal. Judgment and thought content normal.    BP 134/67  Pulse 64  Temp(Src) 96.8 F (36 C) (Oral)  Ht $R'5\' 2"'Ud$  (1.575 m)  Wt 168 lb (76.204 kg)  BMI 30.72 kg/m2       Assessment & Plan:  1. Hypothyroidism   2.  Hypertension   3. Hyperlipidemia   4. GERD (gastroesophageal reflux disease)   5. GAD (generalized anxiety disorder)   6. Hyperglycemia    Orders Placed This Encounter  Procedures  . CMP14+EGFR  . NMR, lipoprofile  . Thyroid Panel With TSH  . POCT glycosylated hemoglobin (Hb A1C)   Meds ordered this encounter  Medications  . atorvastatin (LIPITOR) 40 MG tablet    Sig: Take 1 tablet (40 mg total) by mouth daily.    Dispense:  30 tablet    Refill:  5    Order Specific Question:  Supervising Provider    Answer:  Chipper Herb [[id number]]    Labs pending Health maintenance reviewed Diet and exercise encouraged Continue all meds Follow up  In 3 months   [place], FNP

## 2013-02-19 NOTE — Patient Instructions (Signed)

## 2013-02-21 ENCOUNTER — Other Ambulatory Visit: Payer: Self-pay | Admitting: Nurse Practitioner

## 2013-02-21 LAB — CMP14+EGFR
ALT: 30 IU/L (ref 0–32)
AST: 21 IU/L (ref 0–40)
Albumin/Globulin Ratio: 2.1 (ref 1.1–2.5)
Albumin: 4.8 g/dL (ref 3.5–4.8)
Alkaline Phosphatase: 73 IU/L (ref 39–117)
BILIRUBIN TOTAL: 0.5 mg/dL (ref 0.0–1.2)
BUN/Creatinine Ratio: 20 (ref 11–26)
BUN: 20 mg/dL (ref 8–27)
CO2: 27 mmol/L (ref 18–29)
Calcium: 9.6 mg/dL (ref 8.7–10.3)
Chloride: 97 mmol/L (ref 97–108)
Creatinine, Ser: 0.98 mg/dL (ref 0.57–1.00)
GFR calc Af Amer: 65 mL/min/{1.73_m2} (ref >59)
GFR calc non Af Amer: 57 mL/min/{1.73_m2} — ABNORMAL LOW (ref >59)
GLUCOSE: 137 mg/dL — AB (ref 65–99)
Globulin, Total: 2.3 g/dL (ref 1.5–4.5)
POTASSIUM: 4.1 mmol/L (ref 3.5–5.2)
SODIUM: 140 mmol/L (ref 134–144)
Total Protein: 7.1 g/dL (ref 6.0–8.5)

## 2013-02-21 LAB — THYROID PANEL WITH TSH
Free Thyroxine Index: 3 (ref 1.2–4.9)
T3 UPTAKE RATIO: 26 % (ref 24–39)
T4, Total: 11.4 ug/dL (ref 4.5–12.0)
TSH: 5.35 u[IU]/mL — AB (ref 0.450–4.500)

## 2013-02-21 LAB — NMR, LIPOPROFILE
Cholesterol: 177 mg/dL (ref <200)
HDL Cholesterol by NMR: 38 mg/dL — ABNORMAL LOW (ref >=40)
HDL PARTICLE NUMBER: 30.7 umol/L (ref >=30.5)
LDL PARTICLE NUMBER: 1656 nmol/L — AB (ref <1000)
LDL Size: 20.2 nm — ABNORMAL LOW (ref >20.5)
LDLC SERPL CALC-MCNC: 107 mg/dL — AB (ref <100)
LP-IR SCORE: 70 — AB (ref <=45)
Small LDL Particle Number: 1084 nmol/L — ABNORMAL HIGH (ref <=527)
Triglycerides by NMR: 159 mg/dL — ABNORMAL HIGH (ref <150)

## 2013-02-21 MED ORDER — LEVOTHYROXINE SODIUM 75 MCG PO TABS: 75.0000 ug | ORAL_TABLET | Freq: Every day | ORAL | Status: DC

## 2013-02-21 NOTE — Addendum Note (Signed)
Addended by: Chevis Pretty on: 02/21/2013 08:04 AM   Modules accepted: Orders

## 2013-02-28 ENCOUNTER — Telehealth: Payer: Self-pay | Admitting: Nurse Practitioner

## 2013-02-28 NOTE — Telephone Encounter (Signed)
Discussed las with patient and verbalized understanding.

## 2013-06-19 ENCOUNTER — Encounter: Payer: Self-pay | Admitting: Nurse Practitioner

## 2013-06-19 ENCOUNTER — Ambulatory Visit (INDEPENDENT_AMBULATORY_CARE_PROVIDER_SITE_OTHER): Payer: Medicare Other | Admitting: Nurse Practitioner

## 2013-06-19 VITALS — BP 128/66 | HR 63 | Temp 98.7°F | Ht 62.5 in | Wt 164.0 lb

## 2013-06-19 DIAGNOSIS — Z6829 Body mass index (BMI) 29.0-29.9, adult: Secondary | ICD-10-CM

## 2013-06-19 DIAGNOSIS — I10 Essential (primary) hypertension: Secondary | ICD-10-CM

## 2013-06-19 DIAGNOSIS — K219 Gastro-esophageal reflux disease without esophagitis: Secondary | ICD-10-CM

## 2013-06-19 DIAGNOSIS — E8881 Metabolic syndrome: Secondary | ICD-10-CM

## 2013-06-19 DIAGNOSIS — E785 Hyperlipidemia, unspecified: Secondary | ICD-10-CM

## 2013-06-19 DIAGNOSIS — E039 Hypothyroidism, unspecified: Secondary | ICD-10-CM

## 2013-06-19 DIAGNOSIS — Z713 Dietary counseling and surveillance: Secondary | ICD-10-CM

## 2013-06-19 DIAGNOSIS — F411 Generalized anxiety disorder: Secondary | ICD-10-CM

## 2013-06-19 DIAGNOSIS — E663 Overweight: Secondary | ICD-10-CM | POA: Insufficient documentation

## 2013-06-19 LAB — POCT CBC
Granulocyte percent: 71.4 %G (ref 37–80)
HEMATOCRIT: 39.3 % (ref 37.7–47.9)
HEMOGLOBIN: 13.1 g/dL (ref 12.2–16.2)
Lymph, poc: 1.5 (ref 0.6–3.4)
MCH, POC: 30.7 pg (ref 27–31.2)
MCHC: 33.3 g/dL (ref 31.8–35.4)
MCV: 92.3 fL (ref 80–97)
MPV: 8.6 fL (ref 0–99.8)
POC GRANULOCYTE: 4.2 (ref 2–6.9)
POC LYMPH %: 25.3 % (ref 10–50)
Platelet Count, POC: 190 10*3/uL (ref 142–424)
RBC: 4.3 M/uL (ref 4.04–5.48)
RDW, POC: 12.6 %
WBC: 5.9 10*3/uL (ref 4.6–10.2)

## 2013-06-19 LAB — POCT GLYCOSYLATED HEMOGLOBIN (HGB A1C)

## 2013-06-19 MED ORDER — ATENOLOL 50 MG PO TABS: 50.0000 mg | ORAL_TABLET | Freq: Every day | ORAL | Status: DC

## 2013-06-19 MED ORDER — ENALAPRIL-HYDROCHLOROTHIAZIDE 10-25 MG PO TABS: 1.0000 | ORAL_TABLET | Freq: Every day | ORAL | Status: DC

## 2013-06-19 MED ORDER — OMEPRAZOLE 20 MG PO CPDR: 20.0000 mg | DELAYED_RELEASE_CAPSULE | Freq: Every day | ORAL | Status: DC

## 2013-06-19 MED ORDER — SIMVASTATIN 40 MG PO TABS: 40.0000 mg | ORAL_TABLET | Freq: Every day | ORAL | Status: DC

## 2013-06-19 MED ORDER — LORAZEPAM 0.5 MG PO TABS: 0.5000 mg | ORAL_TABLET | Freq: Two times a day (BID) | ORAL | Status: DC | PRN

## 2013-06-19 NOTE — Progress Notes (Signed)
Subjective:    Patient ID: Janice Zamora, female    DOB: 02/13/1935, 78 y.o.   MRN: [id number]   Patient here today for follow up- she is doing well without complaints  Hypertension This is a chronic problem. The current episode started more than 1 year ago. The problem has been resolved since onset. The problem is controlled. Associated symptoms include anxiety. Pertinent negatives include no blurred vision, chest pain, headaches, malaise/fatigue, neck pain, palpitations, peripheral edema or shortness of breath. There are no associated agents to hypertension. Risk factors for coronary artery disease include dyslipidemia, obesity, post-menopausal state and sedentary lifestyle. Past treatments include ACE inhibitors, diuretics and beta blockers. The current treatment provides significant improvement. Compliance problems include diet and exercise.  Hypertensive end-organ damage includes a thyroid problem. There is no history of retinopathy. There is no history of sleep apnea.  Hyperlipidemia This is a chronic problem. The current episode started more than 1 year ago. The problem is uncontrolled. Recent lipid tests were reviewed and are high. Exacerbating diseases include hypothyroidism and obesity. There are no known factors aggravating her hyperlipidemia. Pertinent negatives include no chest pain or shortness of breath. Current antihyperlipidemic treatment includes statins (changed from zocor to lipitor at last visit.). The current treatment provides moderate improvement of lipids. Compliance problems include adherence to diet and adherence to exercise.  Risk factors for coronary artery disease include hypertension, obesity, post-menopausal, a sedentary lifestyle and dyslipidemia.  Thyroid Problem Presents for follow-up visit. Symptoms include anxiety, dry skin and fatigue. Patient reports no constipation, depressed mood, hair loss, palpitations, weight gain or weight loss. The symptoms have been stable.  Past treatments include levothyroxine. The treatment provided moderate relief. Her past medical history is significant for hyperlipidemia.  GAD Xanax takes prn-2-3X per week mainly at night when she can't sleep. Insurance will no longer cover meds- needs to change.   Review of Systems  Constitutional: Positive for fatigue. Negative for weight loss, weight gain and malaise/fatigue.  Eyes: Negative for blurred vision.  Respiratory: Negative for shortness of breath.   Cardiovascular: Negative for chest pain and palpitations.  Gastrointestinal: Negative for constipation.  Musculoskeletal: Negative for neck pain.  Neurological: Negative for headaches.  All other systems reviewed and are negative.      Objective:   Physical Exam  Vitals reviewed. Constitutional: She is oriented to person, place, and time. She appears well-developed and well-nourished.  HENT:  Nose: Nose normal.  Mouth/Throat: Oropharynx is clear and moist.  Eyes: EOM are normal.  Neck: Trachea normal, normal range of motion and full passive range of motion without pain. Neck supple. No JVD present. Carotid bruit is not present. No thyromegaly present.  Cardiovascular: Normal rate, regular rhythm, normal heart sounds and intact distal pulses.  Exam reveals no gallop and no friction rub.   No murmur heard. Pulmonary/Chest: Effort normal and breath sounds normal.  Abdominal: Soft. Bowel sounds are normal. She exhibits no distension and no mass. There is no tenderness.  Musculoskeletal: Normal range of motion.  Lymphadenopathy:    She has no cervical adenopathy.  Neurological: She is alert and oriented to person, place, and time. She has normal reflexes.  Skin: Skin is warm and dry.  Psychiatric: She has a normal mood and affect. Her behavior is normal. Judgment and thought content normal.    BP 128/66  Pulse 63  Temp(Src) 98.7 F (37.1 C) (Oral)  Ht 5' 2.5" (1.588 m)  Wt 164 lb (74.39 kg)  BMI 29.50 kg/m2           Assessment & Plan:   1. GERD (gastroesophageal reflux disease)   2. Hyperlipidemia   3. Hypertension   4. Hypothyroidism   5. GAD (generalized anxiety disorder)   6. Metabolic syndrome   7. BMI 29.0-29.9,adult   8. Weight loss counseling, encounter for    Orders Placed This Encounter  Procedures  . CMP14+EGFR  . NMR, lipoprofile  . Thyroid Panel With TSH  . POCT CBC  . POCT glycosylated hemoglobin (Hb A1C)   Meds ordered this encounter  Medications  . omeprazole (PRILOSEC) 20 MG capsule    Sig: Take 1 capsule (20 mg total) by mouth daily.    Dispense:  30 capsule    Refill:  5    Order Specific Question:  Supervising Provider    Answer:  Chipper Herb [[id number]]  . enalapril-hydrochlorothiazide (VASERETIC) 10-25 MG per tablet    Sig: Take 1 tablet by mouth daily.    Dispense:  30 tablet    Refill:  5    Order Specific Question:  Supervising Provider    Answer:  Chipper Herb [[id number]]  . atenolol (TENORMIN) 50 MG tablet    Sig: Take 1 tablet (50 mg total) by mouth daily.    Dispense:  30 tablet    Refill:  5    Order Specific Question:  Supervising Provider    Answer:  Chipper Herb [[id number]]  . LORazepam (ATIVAN) 0.5 MG tablet    Sig: Take 1 tablet (0.5 mg total) by mouth 2 (two) times daily as needed for anxiety.    Dispense:  30 tablet    Refill:  1    Order Specific Question:  Supervising Provider    Answer:  Chipper Herb [[id number]]  . simvastatin (ZOCOR) 40 MG tablet    Sig: Take 1 tablet (40 mg total) by mouth at bedtime.    Dispense:  30 tablet    Refill:  3    Order Specific Question:  Supervising Provider    Answer:  Chipper Herb [[id number]]   Changed xanax to lorazepam Changed lipitor to zocor Labs pending Health maintenance reviewed Diet and exercise encouraged Continue all meds Follow up  In 3 months    [place], FNP

## 2013-06-19 NOTE — Patient Instructions (Signed)

## 2013-06-20 LAB — CMP14+EGFR
ALT: 17 IU/L (ref 0–32)
AST: 21 IU/L (ref 0–40)
Albumin/Globulin Ratio: 1.9 (ref 1.1–2.5)
Albumin: 4.4 g/dL (ref 3.5–4.8)
Alkaline Phosphatase: 82 IU/L (ref 39–117)
BUN/Creatinine Ratio: 21 (ref 11–26)
BUN: 21 mg/dL (ref 8–27)
CALCIUM: 9.8 mg/dL (ref 8.7–10.3)
CO2: 26 mmol/L (ref 18–29)
CREATININE: 0.99 mg/dL (ref 0.57–1.00)
Chloride: 98 mmol/L (ref 97–108)
GFR calc Af Amer: 64 mL/min/{1.73_m2} (ref >59)
GFR calc non Af Amer: 56 mL/min/{1.73_m2} — ABNORMAL LOW (ref >59)
GLOBULIN, TOTAL: 2.3 g/dL (ref 1.5–4.5)
Glucose: 133 mg/dL — ABNORMAL HIGH (ref 65–99)
Potassium: 3.8 mmol/L (ref 3.5–5.2)
Sodium: 141 mmol/L (ref 134–144)
TOTAL PROTEIN: 6.7 g/dL (ref 6.0–8.5)
Total Bilirubin: 0.5 mg/dL (ref 0.0–1.2)

## 2013-06-20 LAB — NMR, LIPOPROFILE
CHOLESTEROL: 178 mg/dL (ref 100–199)
HDL CHOLESTEROL BY NMR: 38 mg/dL — AB (ref >39)
HDL PARTICLE NUMBER: 32.4 umol/L (ref >=30.5)
LDL Particle Number: 1538 nmol/L — ABNORMAL HIGH (ref <1000)
LDL Size: 19.7 nm (ref >20.5)
LDLC SERPL CALC-MCNC: 104 mg/dL — AB (ref 0–99)
LP-IR Score: 76 — ABNORMAL HIGH (ref <=45)
Small LDL Particle Number: 1003 nmol/L — ABNORMAL HIGH (ref <=527)
TRIGLYCERIDES BY NMR: 179 mg/dL — AB (ref 0–149)

## 2013-06-20 LAB — THYROID PANEL WITH TSH
Free Thyroxine Index: 2.7 (ref 1.2–4.9)
T3 UPTAKE RATIO: 25 % (ref 24–39)
T4, Total: 10.9 ug/dL (ref 4.5–12.0)
TSH: 1.23 u[IU]/mL (ref 0.450–4.500)

## 2013-10-21 ENCOUNTER — Ambulatory Visit (INDEPENDENT_AMBULATORY_CARE_PROVIDER_SITE_OTHER): Payer: Medicare Other | Admitting: Nurse Practitioner

## 2013-10-21 ENCOUNTER — Encounter: Payer: Self-pay | Admitting: Nurse Practitioner

## 2013-10-21 VITALS — BP 133/66 | HR 65 | Temp 97.7°F | Ht 62.5 in | Wt 168.2 lb

## 2013-10-21 DIAGNOSIS — Z6829 Body mass index (BMI) 29.0-29.9, adult: Secondary | ICD-10-CM

## 2013-10-21 DIAGNOSIS — K219 Gastro-esophageal reflux disease without esophagitis: Secondary | ICD-10-CM

## 2013-10-21 DIAGNOSIS — F411 Generalized anxiety disorder: Secondary | ICD-10-CM

## 2013-10-21 DIAGNOSIS — E785 Hyperlipidemia, unspecified: Secondary | ICD-10-CM

## 2013-10-21 DIAGNOSIS — R739 Hyperglycemia, unspecified: Secondary | ICD-10-CM

## 2013-10-21 DIAGNOSIS — Z13820 Encounter for screening for osteoporosis: Secondary | ICD-10-CM

## 2013-10-21 DIAGNOSIS — I10 Essential (primary) hypertension: Secondary | ICD-10-CM

## 2013-10-21 DIAGNOSIS — E038 Other specified hypothyroidism: Secondary | ICD-10-CM

## 2013-10-21 DIAGNOSIS — E034 Atrophy of thyroid (acquired): Secondary | ICD-10-CM

## 2013-10-21 DIAGNOSIS — R7309 Other abnormal glucose: Secondary | ICD-10-CM

## 2013-10-21 DIAGNOSIS — E0789 Other specified disorders of thyroid: Secondary | ICD-10-CM

## 2013-10-21 MED ORDER — LEVOTHYROXINE SODIUM 75 MCG PO TABS: 75.0000 ug | ORAL_TABLET | Freq: Every day | ORAL | Status: DC

## 2013-10-21 MED ORDER — SIMVASTATIN 40 MG PO TABS: 40.0000 mg | ORAL_TABLET | Freq: Every day | ORAL | Status: DC

## 2013-10-21 NOTE — Patient Instructions (Addendum)
Exercise to Lose Weight Exercise and a healthy diet may help you lose weight. Your doctor may suggest specific exercises. EXERCISE IDEAS AND TIPS  Choose low-cost things you enjoy doing, such as walking, bicycling, or exercising to workout videos.  Take stairs instead of the elevator.  Walk during your lunch break.  Park your car further away from work or school.  Go to a gym or an exercise class.  Start with 5 to 10 minutes of exercise each day. Build up to 30 minutes of exercise 4 to 6 days a week.  Wear shoes with good support and comfortable clothes.  Stretch before and after working out.  Work out until you breathe harder and your heart beats faster.  Drink extra water when you exercise.  Do not do so much that you hurt yourself, feel dizzy, or get very short of breath. Exercises that burn about 150 calories:  Running 1  miles in 15 minutes.  Playing volleyball for 45 to 60 minutes.  Washing and waxing a car for 45 to 60 minutes.  Playing touch football for 45 minutes.  Walking 1  miles in 35 minutes.  Pushing a stroller 1  miles in 30 minutes.  Playing basketball for 30 minutes.  Raking leaves for 30 minutes.  Bicycling 5 miles in 30 minutes.  Walking 2 miles in 30 minutes.  Dancing for 30 minutes.  Shoveling snow for 15 minutes.  Swimming laps for 20 minutes.  Walking up stairs for 15 minutes.  Bicycling 4 miles in 15 minutes.  Gardening for 30 to 45 minutes.  Jumping rope for 15 minutes.  Washing windows or floors for 45 to 60 minutes. Document Released: 02/19/2010 Document Revised: 04/11/2011 Document Reviewed: 02/19/2010 [employer] Patient Information 2015 [employer]. This information is not intended to replace advice given to you by your health care provider. Make sure you discuss any questions you have with your health care provider.  

## 2013-10-21 NOTE — Progress Notes (Signed)
Subjective:    Patient ID: Janice Zamora, female    DOB: Nov 07, 1935, 78 y.o.   MRN: [id number]   Patient here today for follow up- she is doing well without complaints.  Hypertension This is a chronic problem. The current episode started more than 1 year ago. The problem has been resolved since onset. The problem is controlled. Associated symptoms include anxiety. Pertinent negatives include no blurred vision, chest pain, headaches, malaise/fatigue, neck pain, palpitations, peripheral edema or shortness of breath. There are no associated agents to hypertension. Risk factors for coronary artery disease include dyslipidemia, obesity, post-menopausal state and sedentary lifestyle. Past treatments include ACE inhibitors, diuretics and beta blockers. The current treatment provides significant improvement. Compliance problems include diet and exercise.  Hypertensive end-organ damage includes a thyroid problem. There is no history of retinopathy. There is no history of sleep apnea.  Hyperlipidemia This is a chronic problem. The current episode started more than 1 year ago. The problem is uncontrolled. Recent lipid tests were reviewed and are high. Exacerbating diseases include hypothyroidism and obesity. There are no known factors aggravating her hyperlipidemia. Pertinent negatives include no chest pain or shortness of breath. Current antihyperlipidemic treatment includes statins (changed from zocor to lipitor at last visit.). The current treatment provides moderate improvement of lipids. Compliance problems include adherence to diet and adherence to exercise.  Risk factors for coronary artery disease include hypertension, obesity, post-menopausal, a sedentary lifestyle and dyslipidemia.  Thyroid Problem Presents for follow-up visit. Symptoms include anxiety, dry skin and fatigue. Patient reports no constipation, depressed mood, hair loss, palpitations, weight gain or weight loss. The symptoms have been  stable. Past treatments include levothyroxine. The treatment provided moderate relief. Her past medical history is significant for hyperlipidemia.  GAD Xanax takes prn-2-3X per week mainly at night when she can't sleep. Insurance will no longer cover meds- needs to change.   Review of Systems  Constitutional: Positive for fatigue. Negative for weight loss, weight gain and malaise/fatigue.  Eyes: Negative for blurred vision.  Respiratory: Negative for shortness of breath.   Cardiovascular: Negative for chest pain and palpitations.  Gastrointestinal: Negative for constipation.  Musculoskeletal: Negative for neck pain.  Neurological: Negative for headaches.  All other systems reviewed and are negative.      Objective:   Physical Exam  Vitals reviewed. Constitutional: She is oriented to person, place, and time. She appears well-developed and well-nourished.  HENT:  Nose: Nose normal.  Mouth/Throat: Oropharynx is clear and moist.  Eyes: EOM are normal.  Neck: Trachea normal, normal range of motion and full passive range of motion without pain. Neck supple. No JVD present. Carotid bruit is not present. No thyromegaly present.  Cardiovascular: Normal rate, regular rhythm, normal heart sounds and intact distal pulses.  Exam reveals no gallop and no friction rub.   No murmur heard. Pulmonary/Chest: Effort normal and breath sounds normal.  Abdominal: Soft. Bowel sounds are normal. She exhibits no distension and no mass. There is no tenderness.  Musculoskeletal: Normal range of motion.  Lymphadenopathy:    She has no cervical adenopathy.  Neurological: She is alert and oriented to person, place, and time. She has normal reflexes.  Skin: Skin is warm and dry.  Psychiatric: She has a normal mood and affect. Her behavior is normal. Judgment and thought content normal.    BP 133/66  Pulse 65  Temp(Src) 97.7 F (36.5 C) (Oral)  Ht 5' 2.5" (1.588 m)  Wt 168 lb 3.2 oz (76.295 kg)  BMI 30.25  kg/m2        Assessment & Plan:   1. Hypothyroidism due to acquired atrophy of thyroid - Thyroid Panel With TSH - levothyroxine (SYNTHROID) 75 MCG tablet; Take 1 tablet (75 mcg total) by mouth daily before breakfast.  Dispense: 30 tablet; Refill: 5  2. Essential hypertension Low sodium diet  3. Hyperlipidemia Low fat diet - CMP14+EGFR - NMR, lipoprofile - simvastatin (ZOCOR) 40 MG tablet; Take 1 tablet (40 mg total) by mouth at bedtime.  Dispense: 30 tablet; Refill: 5  4. Gastroesophageal reflux disease without esophagitis Do not eat 2 hours prior to bedtime  5. GAD (generalized anxiety disorder) Stress management  6. BMI 29.0-29.9,adult Discussed diet and exercise for person with BMI >25 Will recheck weight in 3-6 months   7. Screening for osteoporosis  - DG Bone Density; Future    Labs pending Health maintenance reviewed Diet and exercise encouraged Continue all meds Follow up  In 3 months   [place], FNP

## 2013-10-22 LAB — THYROID PANEL WITH TSH
Free Thyroxine Index: 2.4 (ref 1.2–4.9)
T3 Uptake Ratio: 27 % (ref 24–39)
T4, Total: 9 ug/dL (ref 4.5–12.0)
TSH: 1.52 u[IU]/mL (ref 0.450–4.500)

## 2013-10-22 LAB — NMR, LIPOPROFILE
CHOLESTEROL: 153 mg/dL (ref 100–199)
HDL CHOLESTEROL BY NMR: 41 mg/dL (ref >39)
HDL PARTICLE NUMBER: 34.8 umol/L (ref >=30.5)
LDL Particle Number: 1067 nmol/L — ABNORMAL HIGH (ref <1000)
LDL Size: 20.4 nm (ref >20.5)
LDLC SERPL CALC-MCNC: 84 mg/dL (ref 0–99)
LP-IR Score: 66 — ABNORMAL HIGH (ref <=45)
SMALL LDL PARTICLE NUMBER: 494 nmol/L (ref <=527)
Triglycerides by NMR: 142 mg/dL (ref 0–149)

## 2013-10-22 LAB — CMP14+EGFR
A/G RATIO: 2 (ref 1.1–2.5)
ALBUMIN: 4.3 g/dL (ref 3.5–4.8)
ALT: 19 IU/L (ref 0–32)
AST: 16 IU/L (ref 0–40)
Alkaline Phosphatase: 71 IU/L (ref 39–117)
BUN/Creatinine Ratio: 20 (ref 11–26)
BUN: 18 mg/dL (ref 8–27)
CALCIUM: 9.5 mg/dL (ref 8.7–10.3)
CO2: 25 mmol/L (ref 18–29)
Chloride: 97 mmol/L (ref 97–108)
Creatinine, Ser: 0.89 mg/dL (ref 0.57–1.00)
GFR calc Af Amer: 73 mL/min/{1.73_m2} (ref >59)
GFR, EST NON AFRICAN AMERICAN: 63 mL/min/{1.73_m2} (ref >59)
GLOBULIN, TOTAL: 2.2 g/dL (ref 1.5–4.5)
Glucose: 125 mg/dL — ABNORMAL HIGH (ref 65–99)
Potassium: 4 mmol/L (ref 3.5–5.2)
Sodium: 138 mmol/L (ref 134–144)
Total Bilirubin: 0.4 mg/dL (ref 0.0–1.2)
Total Protein: 6.5 g/dL (ref 6.0–8.5)

## 2013-10-29 ENCOUNTER — Ambulatory Visit (INDEPENDENT_AMBULATORY_CARE_PROVIDER_SITE_OTHER): Payer: Medicare Other

## 2013-10-29 DIAGNOSIS — Z23 Encounter for immunization: Secondary | ICD-10-CM

## 2013-10-29 LAB — POCT GLYCOSYLATED HEMOGLOBIN (HGB A1C): HEMOGLOBIN A1C: 6

## 2013-10-29 NOTE — Addendum Note (Signed)
Addended by: Earlene Plater on: 10/29/2013 10:22 AM   Modules accepted: Orders

## 2013-11-13 ENCOUNTER — Other Ambulatory Visit: Payer: Self-pay | Admitting: Nurse Practitioner

## 2013-11-13 DIAGNOSIS — Z1231 Encounter for screening mammogram for malignant neoplasm of breast: Secondary | ICD-10-CM

## 2013-12-25 ENCOUNTER — Ambulatory Visit
Admission: RE | Admit: 2013-12-25 | Discharge: 2013-12-25 | Disposition: A | Discharge status: Home / Self Care | Payer: Medicare Other | Source: Ambulatory Visit | Attending: Nurse Practitioner | Admitting: Nurse Practitioner

## 2013-12-25 DIAGNOSIS — Z1231 Encounter for screening mammogram for malignant neoplasm of breast: Secondary | ICD-10-CM

## 2014-01-02 ENCOUNTER — Other Ambulatory Visit: Payer: Self-pay | Admitting: Family Medicine

## 2014-01-08 ENCOUNTER — Ambulatory Visit (INDEPENDENT_AMBULATORY_CARE_PROVIDER_SITE_OTHER): Payer: Medicare Other | Admitting: Pharmacist

## 2014-01-08 ENCOUNTER — Encounter: Payer: Self-pay | Admitting: Pharmacist

## 2014-01-08 ENCOUNTER — Ambulatory Visit (INDEPENDENT_AMBULATORY_CARE_PROVIDER_SITE_OTHER): Payer: Medicare Other

## 2014-01-08 VITALS — Ht 63.0 in | Wt 170.0 lb

## 2014-01-08 DIAGNOSIS — Z13820 Encounter for screening for osteoporosis: Secondary | ICD-10-CM

## 2014-01-08 DIAGNOSIS — M8580 Other specified disorders of bone density and structure, unspecified site: Secondary | ICD-10-CM

## 2014-01-08 LAB — HM DEXA SCAN

## 2014-01-08 MED ORDER — ADULT MULTIVITAMIN W/MINERALS CH: 1.0000 | ORAL_TABLET | Freq: Every day | ORAL | Status: AC

## 2014-01-08 NOTE — Progress Notes (Signed)
Patient ID: Janice Zamora, female   DOB: 13-Mar-1935, 78 y.o.   MRN: [id number]  [facility] Current Height: Height: 5\' 3"  (160 cm)      Max Lifetime Height:  5\' 3"  Current Weight: Weight: 170 lb (77.111 kg)       Ethnicity:Caucasian    HPI: Does pt already have a diagnosis of:  Osteopenia?  Yes Osteoporosis?  No Patient's DEXA of L Spine showed osteopenia in 2004 and 2006 but was normal when checked in 2009 and  2012  Back Pain?  No       Kyphosis?  No Prior fracture?  No Med(s) for Osteoporosis/Osteopenia:  none Med(s) previously tried for Osteoporosis/Osteopenia:  none                                                             PMH: Age at menopause:  78 yo Hysterectomy?  No Oophorectomy?  No HRT? Yes - Former.  Type/duration: patient unsure Steroid Use?  No Thyroid med?  Yes History of cancer?  No History of digestive disorders (ie Crohn's)?  Yes - on chronic PPI for GERD Current or previous eating disorders?  No Last Vitamin D Result:  43 (2013) Last GFR Result:  63 (10/21/2013)   FH/SH: Family history of osteoporosis?  No Parent with history of hip fracture?  No Family history of breast cancer?  No Exercise?  Yes yard work and walks 3 days per week Smoking?  No Alcohol?  No    Calcium Assessment Calcium Intake  # of servings/day  Calcium mg  Milk (8 oz) 0.5  x  300  = 150mg   Yogurt (4 oz) 0 x  200 = 0  Cheese (1 oz) 1 x  200 = 200mg   Other Calcium sources   250mg   Ca supplement 0 = 0   Estimated calcium intake per day 600mg     DEXA Results Date of Test T-Score for AP Spine L1-L4 T-Score for Total Left Hip T-Score for Total Right Hip  01/08/2014 -0.3 0.3 0.3  12/15/2010 -0.6 0.1 0.0  03/28/2007 -0.9 -0.1 -0.1  12/14/2004 -1.2 -0.2 -0.3  07/08/2002 -1.2 -0.4 --   Assessment: Osteopenia with improved results which are now in normal BMD range  Recommendations: 1.  Discussed BMD results and fracture risk 2.  recommend calcium 1200mg  daily  through supplementation or diet. Handout given with sources for calcium from food. 3.  continue weight bearing exercise - 30 minutes at least 4 days per week.   4.  Counseled and educated about fall risk and prevention.  Recheck DEXA:  3 years  Time spent counseling patient:  15 minutes   Cherre Robins, PharmD, CPP

## 2014-01-08 NOTE — Patient Instructions (Signed)
Fall Prevention and Home Safety Falls cause injuries and can affect all age groups. It is possible to use preventive measures to significantly decrease the likelihood of falls. There are many simple measures which can make your home safer and prevent falls. OUTDOORS  Repair cracks and edges of walkways and driveways.  Remove high doorway thresholds.  Trim shrubbery on the main path into your home.  Have good outside lighting.  Clear walkways of tools, rocks, debris, and clutter.  Check that handrails are not broken and are securely fastened. Both sides of steps should have handrails.  Have leaves, snow, and ice cleared regularly.  Be careful when parking in gravel lots or under trees that have acorns - try to avoid if possible  Use sand or salt on walkways during winter months.  In the garage, clean up grease or oil spills.  BATHROOM  Install night lights.  Install grab bars by the toilet and in the tub and shower.  Use non-skid mats or decals in the tub or shower.  Place a plastic non-slip stool in the shower to sit on, if needed.  Keep floors dry and clean up all water on the floor immediately.  Remove soap buildup in the tub or shower on a regular basis.  Secure bath mats with non-slip, double-sided rug tape.  Remove throw rugs and tripping hazards from the floors.  BEDROOMS  Install night lights.  Make sure a bedside light is easy to reach.  Do not use oversized bedding.  Keep a telephone by your bedside.  Have a firm chair with side arms to use for getting dressed.  Remove throw rugs and tripping hazards from the floor.  KITCHEN  Keep handles on pots and pans turned toward the center of the stove. Use back burners when possible.  Clean up spills quickly and allow time for drying.  Avoid walking on wet floors.  Avoid hot utensils and knives.  Position shelves so they are not too high or low.  Place commonly used objects within easy reach.  If  necessary, use a sturdy step stool with a grab bar when reaching.  Keep electrical cables out of the way.  Do not use floor polish or wax that makes floors slippery. If you must use wax, use non-skid floor wax.  Remove throw rugs and tripping hazards from the floor.  STAIRWAYS  Never leave objects on stairs.  Place handrails on both sides of stairways and use them. Fix any loose handrails. Make sure handrails on both sides of the stairways are as long as the stairs.  Check carpeting to make sure it is firmly attached along stairs. Make repairs to worn or loose carpet promptly.  Avoid placing throw rugs at the top or bottom of stairways, or properly secure the rug with carpet tape to prevent slippage. Get rid of throw rugs, if possible.  Have an electrician put in a light switch at the top and bottom of the stairs  OTHER FALL PREVENTION TIPS  Wear low-heel or rubber-soled shoes that are supportive and fit well. Wear closed toe shoes.  When using a stepladder, make sure it is fully opened and both spreaders are firmly locked. Do not climb a closed stepladder.  Add color or contrast paint or tape to grab bars and handrails in your home. Place contrasting color strips on first and last steps.  Learn and use mobility aids as needed. Install an electrical emergency response system.  Turn on lights to avoid dark areas.  Replace light bulbs that burn out immediately. Get light switches that glow.  Arrange furniture to create clear pathways. Keep furniture in the same place.  Firmly attach carpet with non-skid or double-sided tape.  Eliminate uneven floor surfaces.  Select a carpet pattern that does not visually hide the edge of steps.  Be aware of all pets. OTHER HOME SAFETY TIPS  Set the water temperature for 120 F (48.8 C).  Keep emergency numbers on or near the telephone.  Keep smoke detectors on every level of the home and near sleeping areas. Document Released:  01/07/2002 Document Revised: 07/19/2011 Document Reviewed: 04/08/2011 [facility] Patient Information 2015 [place], [place]. This information is not intended to replace advice given to you by your health care provider. Make sure you discuss any questions you have with your health care provider.                Exercise for Strong Bones  Exercise is important to build and maintain strong bones / bone density.  There are 2 types of exercises that are important to building and maintaining strong bones:  Weight- bearing and muscle-stregthening.  Weight-bearing Exercises  These exercises include activities that make you move against gravity while staying upright. Weight-bearing exercises can be high-impact or low-impact.  High-impact weight-bearing exercises help build bones and keep them strong. If you have broken a bone due to osteoporosis or are at risk of breaking a bone, you may need to avoid high-impact exercises. If you're not sure, you should check with your healthcare provider.  Examples of high-impact weight-bearing exercises are: Dancing  Doing high-impact aerobics  Hiking  Jogging/running  Jumping Rope  Stair climbing  Tennis  Low-impact weight-bearing exercises can also help keep bones strong and are a safe alternative if you cannot do high-impact exercises.   Examples of low-impact weight-bearing exercises are: Using elliptical training machines  Doing low-impact aerobics  Using stair-step machines  Fast walking on a treadmill or outside   Muscle-Strengthening Exercises These exercises include activities where you move your body, a weight or some other resistance against gravity. They are also known as resistance exercises and include: Lifting weights  Using elastic exercise bands  Using weight machines  Lifting your own body weight  Functional movements, such as standing and rising up on your toes  Yoga and Pilates can also improve strength, balance and flexibility.  However, certain positions may not be safe for people with osteoporosis or those at increased risk of broken bones. For example, exercises that have you bend forward may increase the chance of breaking a bone in the spine.   Non-Impact Exercises There are other types of exercises that can help prevent falls.  Non-impact exercises can help you to improve balance, posture and how well you move in everyday activities. Some of these exercises include: Balance exercises that strengthen your legs and test your balance, such as Tai Chi, can decrease your risk of falls.  Posture exercises that improve your posture and reduce rounded or "sloping" shoulders can help you decrease the chance of breaking a bone, especially in the spine.  Functional exercises that improve how well you move can help you with everyday activities and decrease your chance of falling and breaking a bone. For example, if you have trouble getting up from a chair or climbing stairs, you should do these activities as exercises.   **A physical therapist can teach you balance, posture and functional exercises. He/she can also help you learn which exercises are safe  and appropriate for you.  [facility] has a physical therapy office in [place] in front of our office and referrals can be made for assessments and treatment as needed and strength and balance training.  If you would like to have an assessment with [facility] and our physical therapy team please let a nurse or provider know.

## 2014-02-24 ENCOUNTER — Ambulatory Visit: Payer: Medicare Other | Admitting: Nurse Practitioner

## 2014-02-25 ENCOUNTER — Ambulatory Visit (INDEPENDENT_AMBULATORY_CARE_PROVIDER_SITE_OTHER): Payer: Medicare Other

## 2014-02-25 ENCOUNTER — Ambulatory Visit (INDEPENDENT_AMBULATORY_CARE_PROVIDER_SITE_OTHER): Payer: Medicare Other | Admitting: Nurse Practitioner

## 2014-02-25 ENCOUNTER — Encounter: Payer: Self-pay | Admitting: Nurse Practitioner

## 2014-02-25 VITALS — BP 139/66 | HR 73 | Temp 97.2°F | Ht 63.0 in | Wt 167.0 lb

## 2014-02-25 DIAGNOSIS — I10 Essential (primary) hypertension: Secondary | ICD-10-CM

## 2014-02-25 DIAGNOSIS — E038 Other specified hypothyroidism: Secondary | ICD-10-CM

## 2014-02-25 DIAGNOSIS — Z6829 Body mass index (BMI) 29.0-29.9, adult: Secondary | ICD-10-CM

## 2014-02-25 DIAGNOSIS — F411 Generalized anxiety disorder: Secondary | ICD-10-CM

## 2014-02-25 DIAGNOSIS — E034 Atrophy of thyroid (acquired): Secondary | ICD-10-CM

## 2014-02-25 DIAGNOSIS — K219 Gastro-esophageal reflux disease without esophagitis: Secondary | ICD-10-CM

## 2014-02-25 DIAGNOSIS — E785 Hyperlipidemia, unspecified: Secondary | ICD-10-CM

## 2014-02-25 MED ORDER — LORAZEPAM 0.5 MG PO TABS
0.5000 mg | ORAL_TABLET | Freq: Two times a day (BID) | ORAL | Status: DC | PRN
Start: 2014-02-25 — End: 2014-09-03

## 2014-02-25 NOTE — Progress Notes (Addendum)
Subjective:    Patient ID: Janice Zamora, female    DOB: 12/23/1935, 79 y.o.   MRN: [id number]   Patient here today for follow up- she is doing well without complaints.  Hypertension This is a chronic problem. The current episode started more than 1 year ago. The problem is unchanged. The problem is controlled. Pertinent negatives include no chest pain, headaches, neck pain, palpitations or shortness of breath. Risk factors for coronary artery disease include dyslipidemia and post-menopausal state. Past treatments include ACE inhibitors, diuretics and beta blockers. The current treatment provides moderate improvement. Compliance problems include exercise.  Hypertensive end-organ damage includes a thyroid problem.  Hyperlipidemia This is a chronic problem. The current episode started more than 1 year ago. The problem is controlled. Recent lipid tests were reviewed and are normal. Exacerbating diseases include hypothyroidism. She has no history of diabetes or obesity. Factors aggravating her hyperlipidemia include thiazides. Pertinent negatives include no chest pain or shortness of breath. Current antihyperlipidemic treatment includes statins. The current treatment provides moderate improvement of lipids. Compliance problems include adherence to diet and adherence to exercise.  Risk factors for coronary artery disease include dyslipidemia, hypertension and post-menopausal.  Thyroid Problem Presents for follow-up (hypothyroidism) visit. Symptoms include fatigue. Patient reports no constipation or palpitations. Her past medical history is significant for hyperlipidemia. There is no history of diabetes.  GAD Xanax takes prn-2-3X per week mainly at night when she can't sleep. Insurance will no longer cover meds- needs to change. GERD On omeprazole 4m daily- keeps symptoms under control     Review of Systems  Constitutional: Positive for fatigue.  Respiratory: Negative for shortness of breath.    Cardiovascular: Negative for chest pain and palpitations.  Gastrointestinal: Negative for constipation.  Genitourinary: Negative.   Musculoskeletal: Negative for neck pain.  Neurological: Negative for headaches.  All other systems reviewed and are negative.      Objective:   Physical Exam  Constitutional: She is oriented to person, place, and time. She appears well-developed and well-nourished.  HENT:  Nose: Nose normal.  Mouth/Throat: Oropharynx is clear and moist.  Eyes: EOM are normal.  Neck: Trachea normal, normal range of motion and full passive range of motion without pain. Neck supple. No JVD present. Carotid bruit is not present. No thyromegaly present.  Cardiovascular: Normal rate, regular rhythm, normal heart sounds and intact distal pulses.  Exam reveals no gallop and no friction rub.   No murmur heard. Pulmonary/Chest: Effort normal and breath sounds normal.  Abdominal: Soft. Bowel sounds are normal. She exhibits no distension and no mass. There is no tenderness.  Musculoskeletal: Normal range of motion.  Lymphadenopathy:    She has no cervical adenopathy.  Neurological: She is alert and oriented to person, place, and time. She has normal reflexes.  Skin: Skin is warm and dry.  Psychiatric: She has a normal mood and affect. Her behavior is normal. Judgment and thought content normal.  Vitals reviewed.   BP 139/66 mmHg  Pulse 73  Temp(Src) 97.2 F (36.2 C) (Oral)  Ht _0  (1.6 m)  Wt 167 lb (75.751 kg)  BMI 29.59 kg/m2  Chest x ray- unchanged from previous- no acute findings-Preliminary reading by MRonnald Collum FNP  [facility]- NKerry Hough FNP       Assessment & Plan:  1. Essential hypertension Do not add salt to diet - CMP14+EGFR - DG Chest 2 View; Future - EKG 12-Lead  2. Gastroesophageal reflux disease without esophagitis Watch spicy foods in diet  Do not eat 2 hours prior to bedtime  3. Hypothyroidism due to acquired atrophy of  thyroid - Thyroid Panel With TSH  4. Hyperlipidemia Watch fats in diet - NMR, lipoprofile  5. GAD (generalized anxiety disorder) Stress management - LORazepam (ATIVAN) 0.5 MG tablet; Take 1 tablet (0.5 mg total) by mouth 2 (two) times daily as needed for anxiety.  Dispense: 30 tablet; Refill: 1  6. BMI 29.0-29.9,adult Discussed diet and exercise for person with BMI >25 Will recheck weight in 3-6 months    hemoccult cards given to patient with directions Labs pending Health maintenance reviewed Diet and exercise encouraged Continue all meds Follow up  In 3 months   [place], FNP

## 2014-02-26 LAB — NMR, LIPOPROFILE
Cholesterol: 174 mg/dL (ref 100–199)
HDL Cholesterol by NMR: 43 mg/dL (ref >39)
HDL Particle Number: 34.8 umol/L (ref >=30.5)
LDL Particle Number: 1424 nmol/L — ABNORMAL HIGH (ref <1000)
LDL Size: 20 nm (ref >20.5)
LDL-C: 98 mg/dL (ref 0–99)
LP-IR Score: 67 — ABNORMAL HIGH (ref <=45)
Small LDL Particle Number: 882 nmol/L — ABNORMAL HIGH (ref <=527)
Triglycerides by NMR: 166 mg/dL — ABNORMAL HIGH (ref 0–149)

## 2014-02-26 LAB — CMP14+EGFR
A/G RATIO: 2 (ref 1.1–2.5)
ALT: 23 IU/L (ref 0–32)
AST: 21 IU/L (ref 0–40)
Albumin: 4.5 g/dL (ref 3.5–4.8)
Alkaline Phosphatase: 80 IU/L (ref 39–117)
BILIRUBIN TOTAL: 0.5 mg/dL (ref 0.0–1.2)
BUN / CREAT RATIO: 14 (ref 11–26)
BUN: 13 mg/dL (ref 8–27)
CHLORIDE: 96 mmol/L — AB (ref 97–108)
CO2: 25 mmol/L (ref 18–29)
Calcium: 10 mg/dL (ref 8.7–10.3)
Creatinine, Ser: 0.92 mg/dL (ref 0.57–1.00)
GFR calc non Af Amer: 61 mL/min/{1.73_m2} (ref >59)
GFR, EST AFRICAN AMERICAN: 70 mL/min/{1.73_m2} (ref >59)
GLUCOSE: 134 mg/dL — AB (ref 65–99)
Globulin, Total: 2.3 g/dL (ref 1.5–4.5)
Potassium: 4.1 mmol/L (ref 3.5–5.2)
Sodium: 140 mmol/L (ref 134–144)
Total Protein: 6.8 g/dL (ref 6.0–8.5)

## 2014-02-26 LAB — THYROID PANEL WITH TSH
Free Thyroxine Index: 3.3 (ref 1.2–4.9)
T3 Uptake Ratio: 26 % (ref 24–39)
T4 TOTAL: 12.8 ug/dL — AB (ref 4.5–12.0)
TSH: 1.63 u[IU]/mL (ref 0.450–4.500)

## 2014-04-07 ENCOUNTER — Other Ambulatory Visit: Payer: Self-pay | Admitting: Nurse Practitioner

## 2014-05-07 ENCOUNTER — Encounter: Payer: Self-pay | Admitting: Nurse Practitioner

## 2014-05-07 ENCOUNTER — Ambulatory Visit (INDEPENDENT_AMBULATORY_CARE_PROVIDER_SITE_OTHER): Payer: Medicare Other

## 2014-05-07 ENCOUNTER — Ambulatory Visit (INDEPENDENT_AMBULATORY_CARE_PROVIDER_SITE_OTHER): Payer: Medicare Other | Admitting: Nurse Practitioner

## 2014-05-07 VITALS — BP 122/64 | HR 84 | Temp 96.8°F | Ht 63.0 in | Wt 163.0 lb

## 2014-05-07 DIAGNOSIS — E034 Atrophy of thyroid (acquired): Secondary | ICD-10-CM

## 2014-05-07 DIAGNOSIS — K5732 Diverticulitis of large intestine without perforation or abscess without bleeding: Secondary | ICD-10-CM

## 2014-05-07 DIAGNOSIS — E785 Hyperlipidemia, unspecified: Secondary | ICD-10-CM | POA: Diagnosis not present

## 2014-05-07 DIAGNOSIS — E038 Other specified hypothyroidism: Secondary | ICD-10-CM

## 2014-05-07 DIAGNOSIS — R1030 Lower abdominal pain, unspecified: Secondary | ICD-10-CM

## 2014-05-07 LAB — POCT URINALYSIS DIPSTICK
BILIRUBIN UA: NEGATIVE
GLUCOSE UA: NEGATIVE
KETONES UA: NEGATIVE
Nitrite, UA: POSITIVE
SPEC GRAV UA: 1.02
UROBILINOGEN UA: NEGATIVE
pH, UA: 6

## 2014-05-07 LAB — POCT CBC
GRANULOCYTE PERCENT: 85.7 % — AB (ref 37–80)
HCT, POC: 38.6 % (ref 37.7–47.9)
Hemoglobin: 12.1 g/dL — AB (ref 12.2–16.2)
Lymph, poc: 1.7 (ref 0.6–3.4)
MCH, POC: 28.9 pg (ref 27–31.2)
MCHC: 31.2 g/dL — AB (ref 31.8–35.4)
MCV: 92.3 fL (ref 80–97)
MPV: 7.7 fL (ref 0–99.8)
PLATELET COUNT, POC: 221 10*3/uL (ref 142–424)
POC Granulocyte: 12.6 — AB (ref 2–6.9)
POC LYMPH PERCENT: 11.6 %L (ref 10–50)
RBC: 4.18 M/uL (ref 4.04–5.48)
RDW, POC: 13.3 %
WBC: 14.7 10*3/uL — AB (ref 4.6–10.2)

## 2014-05-07 LAB — POCT UA - MICROSCOPIC ONLY
CASTS, UR, LPF, POC: NEGATIVE
CRYSTALS, UR, HPF, POC: NEGATIVE
YEAST UA: NEGATIVE

## 2014-05-07 MED ORDER — LEVOTHYROXINE SODIUM 75 MCG PO TABS: 75.0000 ug | ORAL_TABLET | Freq: Every day | ORAL | Status: DC

## 2014-05-07 MED ORDER — METRONIDAZOLE 500 MG PO TABS: 500.0000 mg | ORAL_TABLET | Freq: Two times a day (BID) | ORAL | Status: DC

## 2014-05-07 MED ORDER — CIPROFLOXACIN HCL 500 MG PO TABS: 500.0000 mg | ORAL_TABLET | Freq: Two times a day (BID) | ORAL | Status: DC

## 2014-05-07 MED ORDER — SIMVASTATIN 40 MG PO TABS: 40.0000 mg | ORAL_TABLET | Freq: Every day | ORAL | Status: DC

## 2014-05-07 NOTE — Patient Instructions (Signed)

## 2014-05-07 NOTE — Progress Notes (Signed)
Subjective:    Patient ID: Janice Zamora, female    DOB: 14-Feb-1935, 79 y.o.   MRN: [id number]  HPI The patient presents today c/o lower abdominal pain-started 4 days ago which awoke her from her sleep. She reports a sharp pain 8/10 across the lower abdominal quadrants that comes and goes approximately every 30 minutes. Does not radiate. She reports she had nausea and had one episode of emesis after taking a medication on an empty stomach. Denies changes in bowel habits or quality of stool. She reports she had frequency over this past weekend, denies burning, urgency, changes in urine appearance or odor. She states she has been running temp of 99.5-99.8 over the past two days. She has been taking aleve to help with the pain without any relief. She reports a history of diverticulitis and states that this is similar.     Review of Systems  Constitutional: Positive for fever, chills and appetite change.  Respiratory: Negative for shortness of breath.   Cardiovascular: Negative for chest pain and palpitations.  Gastrointestinal: Positive for abdominal pain. Negative for nausea, vomiting, diarrhea, constipation, blood in stool and abdominal distention.  Genitourinary: Negative for urgency, flank pain and difficulty urinating. Frequency: Not since this past weekend.   All other systems reviewed and are negative.      Objective:   Physical Exam  Constitutional: She is oriented to person, place, and time. She appears well-developed and well-nourished. No distress.  Cardiovascular: Normal rate, regular rhythm and normal heart sounds.   Pulmonary/Chest: Effort normal and breath sounds normal. No respiratory distress.  Abdominal: Soft. Bowel sounds are normal. She exhibits no mass. There is tenderness (Mild diffuse tenderness to lower quadrants. ). There is no rebound and no guarding.  Lymphadenopathy:    She has no cervical adenopathy.  Neurological: She is alert and oriented to person, place,  and time.  Skin: Skin is warm and dry. She is not diaphoretic.  Psychiatric: She has a normal mood and affect. Her behavior is normal. Judgment and thought content normal.  Vitals reviewed.   BP 122/64 mmHg  Pulse 84  Temp(Src) 96.8 F (36 C) (Oral)  Ht 5\' 3"  (1.6 m)  Wt 163 lb (73.936 kg)  BMI 28.88 kg/m2  KUB- normal findings-Preliminary reading by Ronnald Collum, FNP  [facility]  Results for orders placed or performed in visit on 05/07/14  POCT CBC  Result Value Ref Range   WBC 14.7 (A) 4.6 - 10.2 K/uL   Lymph, poc 1.7 0.6 - 3.4   POC LYMPH PERCENT 11.6 10 - 50 %L   MID (cbc)  0 - 0.9   POC MID %  0 - 12 %M   POC Granulocyte 12.6 (A) 2 - 6.9   Granulocyte percent 85.7 (A) 37 - 80 %G   RBC 4.18 4.04 - 5.48 M/uL   Hemoglobin 12.1 (A) 12.2 - 16.2 g/dL   HCT, POC 38.6 37.7 - 47.9 %   MCV 92.3 80 - 97 fL   MCH, POC 28.9 27 - 31.2 pg   MCHC 31.2 (A) 31.8 - 35.4 g/dL   RDW, POC 13.3 %   Platelet Count, POC 221 142 - 424 K/uL   MPV 7.7 0 - 99.8 fL  POCT UA - Microscopic Only  Result Value Ref Range   WBC, Ur, HPF, POC 5-10    RBC, urine, microscopic 10-15    Bacteria, U Microscopic occ    Mucus, UA occ    Epithelial cells, urine per  micros rare    Crystals, Ur, HPF, POC neg    Casts, Ur, LPF, POC neg    Yeast, UA neg   POCT urinalysis dipstick  Result Value Ref Range   Color, UA straw    Clarity, UA cloudy    Glucose, UA neg    Bilirubin, UA neg    Ketones, UA neg    Spec Grav, UA 1.020    Blood, UA large    pH, UA 6.0    Protein, UA 3+++    Urobilinogen, UA negative    Nitrite, UA pos    Leukocytes, UA moderate (2+)          Assessment & Plan:  1. Hypothyroidism due to acquired atrophy of thyroid - levothyroxine (SYNTHROID) 75 MCG tablet; Take 1 tablet (75 mcg total) by mouth daily before breakfast.  Dispense: 30 tablet; Refill: 5  2. Hyperlipidemia - simvastatin (ZOCOR) 40 MG tablet; Take 1 tablet (40 mg total) by mouth at bedtime.  Dispense: 30 tablet;  Refill: 5  3. Lower abdominal pain - DG Abd 1 View; Future - POCT CBC - POCT UA - Microscopic Only - POCT urinalysis dipstick  4. Diverticulitis of colon without hemorrhage Watch diet- nothing with skin or seeds - metroNIDAZOLE (FLAGYL) 500 MG tablet; Take 1 tablet (500 mg total) by mouth 2 (two) times daily.  Dispense: 14 tablet; Refill: 0 - ciprofloxacin (CIPRO) 500 MG tablet; Take 1 tablet (500 mg total) by mouth 2 (two) times daily.  Dispense: 20 tablet; Refill: 0  5. UTI cipro should cover   Mary-Margaret Hassell Done, FNP

## 2014-05-21 ENCOUNTER — Telehealth: Payer: Self-pay | Admitting: Nurse Practitioner

## 2014-05-21 DIAGNOSIS — K5732 Diverticulitis of large intestine without perforation or abscess without bleeding: Secondary | ICD-10-CM

## 2014-05-21 MED ORDER — CIPROFLOXACIN HCL 500 MG PO TABS: 500.0000 mg | ORAL_TABLET | Freq: Two times a day (BID) | ORAL | Status: DC

## 2014-05-21 MED ORDER — METRONIDAZOLE 500 MG PO TABS: 500.0000 mg | ORAL_TABLET | Freq: Two times a day (BID) | ORAL | Status: DC

## 2014-05-21 NOTE — Telephone Encounter (Signed)
Flagyl and cipro sent to pharmacy- do  Not take if does not need

## 2014-05-21 NOTE — Telephone Encounter (Signed)
lmtcb

## 2014-05-23 NOTE — Telephone Encounter (Signed)
Left message on voicemail that Rx's were called to pharmacy and to not take unless need to

## 2014-05-28 ENCOUNTER — Ambulatory Visit (INDEPENDENT_AMBULATORY_CARE_PROVIDER_SITE_OTHER): Payer: Medicare Other | Admitting: Nurse Practitioner

## 2014-05-28 ENCOUNTER — Encounter: Payer: Self-pay | Admitting: Nurse Practitioner

## 2014-05-28 VITALS — BP 132/70 | HR 75 | Temp 96.8°F | Ht 63.0 in | Wt 158.0 lb

## 2014-05-28 DIAGNOSIS — E038 Other specified hypothyroidism: Secondary | ICD-10-CM | POA: Diagnosis not present

## 2014-05-28 DIAGNOSIS — E034 Atrophy of thyroid (acquired): Secondary | ICD-10-CM

## 2014-05-28 DIAGNOSIS — I10 Essential (primary) hypertension: Secondary | ICD-10-CM

## 2014-05-28 DIAGNOSIS — K219 Gastro-esophageal reflux disease without esophagitis: Secondary | ICD-10-CM

## 2014-05-28 DIAGNOSIS — Z6829 Body mass index (BMI) 29.0-29.9, adult: Secondary | ICD-10-CM | POA: Diagnosis not present

## 2014-05-28 DIAGNOSIS — F411 Generalized anxiety disorder: Secondary | ICD-10-CM

## 2014-05-28 DIAGNOSIS — E785 Hyperlipidemia, unspecified: Secondary | ICD-10-CM | POA: Diagnosis not present

## 2014-05-28 MED ORDER — ATENOLOL 50 MG PO TABS: 50.0000 mg | ORAL_TABLET | Freq: Every day | ORAL | Status: DC

## 2014-05-28 MED ORDER — OMEPRAZOLE 20 MG PO CPDR: 20.0000 mg | DELAYED_RELEASE_CAPSULE | Freq: Every day | ORAL | Status: DC

## 2014-05-28 MED ORDER — ENALAPRIL-HYDROCHLOROTHIAZIDE 10-25 MG PO TABS: 1.0000 | ORAL_TABLET | Freq: Every day | ORAL | Status: DC

## 2014-05-28 NOTE — Patient Instructions (Signed)
Bone Health Our bones do many things. They provide structure, protect organs, anchor muscles, and store calcium. Adequate calcium in your diet and weight-bearing physical activity help build strong bones, improve bone amounts, and may reduce the risk of weakening of bones (osteoporosis) later in life. PEAK BONE MASS By age 77, the average woman has acquired most of her skeletal bone mass. A large decline occurs in older adults which increases the risk of osteoporosis. In women this occurs around the time of menopause. It is important for young girls to reach their peak bone mass in order to maintain bone health throughout life. A person with high bone mass as a young adult will be more likely to have a higher bone mass later in life. Not enough calcium consumption and physical activity early on could result in a failure to achieve optimum bone mass in adulthood. OSTEOPOROSIS Osteoporosis is a disease of the bones. It is defined as low bone mass with deterioration of bone structure. Osteoporosis leads to an increase risk of fractures with falls. These fractures commonly happen in the wrist, hip, and spine. While men and women of all ages and background can develop osteoporosis, some of the risk factors for osteoporosis are:  Female.  White.  Postmenopausal.  Older adults.  Small in body size.  Eating a diet low in calcium.  Physically inactive.  Smoking.  Use of some medications.  Family history. CALCIUM Calcium is a mineral needed by the body for healthy bones, teeth, and proper function of the heart, muscles, and nerves. The body cannot produce calcium so it must be absorbed through food. Good sources of calcium include:  Dairy products (low fat or nonfat milk, cheese, and yogurt).  Dark green leafy vegetables (bok choy and broccoli).  Calcium fortified foods (orange juice, cereal, bread, soy beverages, and tofu products).  Nuts (almonds). Recommended amounts of calcium vary  for individuals. RECOMMENDED CALCIUM INTAKES Age and Amount in mg per day  Children 1 to 3 years / 700 mg  Children 4 to 8 years / 1,000 mg  Children 9 to 13 years / 1,300 mg  Teens 14 to 18 years / 1,300 mg  Adults 19 to 50 years / 1,000 mg  Adult women 51 to 70 years / 1,200 mg  Adults 71 years and older / 1,200 mg  Pregnant and breastfeeding teens / 1,300 mg  Pregnant and breastfeeding adults / 1,000 mg Vitamin D also plays an important role in healthy bone development. Vitamin D helps in the absorption of calcium. WEIGHT-BEARING PHYSICAL ACTIVITY Regular physical activity has many positive health benefits. Benefits include strong bones. Weight-bearing physical activity early in life is important in reaching peak bone mass. Weight-bearing physical activities cause muscles and bones to work against gravity. Some examples of weight bearing physical activities include:  Walking, jogging, or running.  Field Hockey.  Jumping rope.  Dancing.  Soccer.  Tennis or Racquetball.  Stair climbing.  Basketball.  Hiking.  Weight lifting.  Aerobic fitness classes. Including weight-bearing physical activity into an exercise plan is a great way to keep bones healthy. Adults: Engage in at least 30 minutes of moderate physical activity on most, preferably all, days of the week. Children: Engage in at least 60 minutes of moderate physical activity on most, preferably all, days of the week. FOR MORE INFORMATION [place] Department of Agriculture, Center for Nutrition Policy and Promotion: www.cnpp.usda.gov National Osteoporosis Foundation: www.nof.org Document Released: 04/09/2003 Document Revised: 05/14/2012 Document Reviewed: 07/09/2008 [employer] Patient Information   2015 [employer]. This information is not intended to replace advice given to you by your health care provider. Make sure you discuss any questions you have with your health care provider.  

## 2014-05-28 NOTE — Progress Notes (Signed)
Subjective:    Patient ID: Janice Zamora, female    DOB: 1935-09-06, 79 y.o.   MRN: [id number]   Patient here today for follow up- she is doing well without complaints. She is feeling much better since her last visit when she had diverticulitis, but had a decreased appetite while taking antibiotics and is still feeling mildly fatigued.   She states she takes her blood pressure at home every 2-3 days -automated brachial device-110/60s. She states she took her blood pressure medication late this morning about 30 minutes prior to arriving.   Hypertension This is a chronic problem. The current episode started more than 1 year ago. The problem is unchanged. The problem is controlled. Pertinent negatives include no chest pain, headaches, neck pain, palpitations or shortness of breath. Risk factors for coronary artery disease include dyslipidemia and post-menopausal state. Past treatments include ACE inhibitors, diuretics and beta blockers. The current treatment provides moderate improvement. Compliance problems include exercise.  Hypertensive end-organ damage includes a thyroid problem.  Hyperlipidemia This is a chronic problem. The current episode started more than 1 year ago. The problem is controlled. Recent lipid tests were reviewed and are normal. Exacerbating diseases include hypothyroidism. She has no history of diabetes or obesity. Factors aggravating her hyperlipidemia include thiazides. Pertinent negatives include no chest pain, myalgias or shortness of breath. Current antihyperlipidemic treatment includes statins. The current treatment provides moderate improvement of lipids. Compliance problems include adherence to diet and adherence to exercise.  Risk factors for coronary artery disease include dyslipidemia, hypertension and post-menopausal.  Thyroid Problem Presents for follow-up (hypothyroidism) visit. Symptoms include fatigue (Mild fatigue after having diverticulitis and a viral stomach  illness with diarrhea. ). Patient reports no constipation, diarrhea or palpitations. Her past medical history is significant for hyperlipidemia. There is no history of diabetes.  GAD She takes 1-2 0.5 mg lorazepam as needed per week. She states she becomes nervous and her mind starts racing unpredictably. The lorazepam works well.  GERD On omeprazole 4m daily- keeps symptoms under control. States her symptoms come back if she forgets to take this for a day or two.      Review of Systems  Constitutional: Positive for appetite change (Decreased appetite. ) and fatigue (Mild fatigue after having diverticulitis and a viral stomach illness with diarrhea. ). Negative for activity change.  HENT: Negative.   Respiratory: Negative for shortness of breath.   Cardiovascular: Negative for chest pain, palpitations and leg swelling.  Gastrointestinal: Negative for abdominal pain, diarrhea, constipation, blood in stool and abdominal distention.  Genitourinary: Negative.   Musculoskeletal: Negative for myalgias, arthralgias and neck pain.  Neurological: Negative for dizziness, light-headedness and headaches.  All other systems reviewed and are negative.      Objective:   Physical Exam  Constitutional: She is oriented to person, place, and time. She appears well-developed and well-nourished.  HENT:  Nose: Nose normal.  Mouth/Throat: Oropharynx is clear and moist.  Eyes: EOM are normal.  Neck: Trachea normal, normal range of motion and full passive range of motion without pain. Neck supple. No JVD present. Carotid bruit is not present. No thyromegaly present.  Cardiovascular: Normal rate, regular rhythm, normal heart sounds and intact distal pulses.  Exam reveals no gallop and no friction rub.   No murmur heard. Pulmonary/Chest: Effort normal and breath sounds normal.  Abdominal: Soft. Bowel sounds are normal. She exhibits no distension and no mass. There is no tenderness.  Musculoskeletal: Normal  range of motion.  Lymphadenopathy:  She has no cervical adenopathy.  Neurological: She is alert and oriented to person, place, and time. She has normal reflexes.  Skin: Skin is warm and dry.  Psychiatric: She has a normal mood and affect. Her behavior is normal. Judgment and thought content normal.  Vitals reviewed.  BP 132/70 mmHg  Pulse 75  Temp(Src) 96.8 F (36 C) (Oral)  Ht 5' 3" (1.6 m)  Wt 158 lb (71.668 kg)  BMI 28.00 kg/m2     Assessment & Plan:   1. Essential hypertension Do not add salt to diet - CMP14+EGFR - enalapril-hydrochlorothiazide (VASERETIC) 10-25 MG per tablet; Take 1 tablet by mouth daily.  Dispense: 30 tablet; Refill: 5 - atenolol (TENORMIN) 50 MG tablet; Take 1 tablet (50 mg total) by mouth daily.  Dispense: 30 tablet; Refill: 5  2. Gastroesophageal reflux disease without esophagitis Avoid spicy foods Do not eat 2 hours prior to bedtime - omeprazole (PRILOSEC) 20 MG capsule; Take 1 capsule (20 mg total) by mouth daily.  Dispense: 30 capsule; Refill: 5  3. Hypothyroidism due to acquired atrophy of thyroid  4. Hyperlipidemia Low fat diet - NMR, lipoprofile  5. GAD (generalized anxiety disorder) Stress management  6. BMI 29.0-29.9,adult Discussed diet and exercise for person with BMI >25 Will recheck weight in 3-6 months    Will think about prevnar Labs pending Health maintenance reviewed Diet and exercise encouraged Continue all meds Follow up  In 3 month   [place], FNP

## 2014-05-29 LAB — NMR, LIPOPROFILE
CHOLESTEROL: 172 mg/dL (ref 100–199)
HDL Cholesterol by NMR: 41 mg/dL (ref >39)
HDL Particle Number: 30.1 umol/L — ABNORMAL LOW (ref >=30.5)
LDL Particle Number: 1429 nmol/L — ABNORMAL HIGH (ref <1000)
LDL Size: 20.4 nm (ref >20.5)
LDL-C: 103 mg/dL — ABNORMAL HIGH (ref 0–99)
LP-IR Score: 63 — ABNORMAL HIGH (ref <=45)
SMALL LDL PARTICLE NUMBER: 847 nmol/L — AB (ref <=527)
TRIGLYCERIDES BY NMR: 138 mg/dL (ref 0–149)

## 2014-05-29 LAB — CMP14+EGFR
ALT: 17 IU/L (ref 0–32)
AST: 20 IU/L (ref 0–40)
Albumin/Globulin Ratio: 1.7 (ref 1.1–2.5)
Albumin: 4.3 g/dL (ref 3.5–4.8)
Alkaline Phosphatase: 73 IU/L (ref 39–117)
BUN/Creatinine Ratio: 21 (ref 11–26)
BUN: 20 mg/dL (ref 8–27)
Bilirubin Total: 0.3 mg/dL (ref 0.0–1.2)
CALCIUM: 10.1 mg/dL (ref 8.7–10.3)
CHLORIDE: 97 mmol/L (ref 97–108)
CO2: 26 mmol/L (ref 18–29)
Creatinine, Ser: 0.94 mg/dL (ref 0.57–1.00)
GFR calc Af Amer: 68 mL/min/{1.73_m2} (ref >59)
GFR calc non Af Amer: 59 mL/min/{1.73_m2} — ABNORMAL LOW (ref >59)
Globulin, Total: 2.6 g/dL (ref 1.5–4.5)
Glucose: 124 mg/dL — ABNORMAL HIGH (ref 65–99)
POTASSIUM: 4 mmol/L (ref 3.5–5.2)
SODIUM: 140 mmol/L (ref 134–144)
Total Protein: 6.9 g/dL (ref 6.0–8.5)

## 2014-09-03 ENCOUNTER — Other Ambulatory Visit: Payer: Self-pay | Admitting: Nurse Practitioner

## 2014-09-03 NOTE — Telephone Encounter (Signed)
Last seen 05/28/14  MMM If approved route to nurse to call into [facility]

## 2014-09-04 NOTE — Telephone Encounter (Signed)
Please call in ativan with 0 refills no more refills without being seen

## 2014-09-08 ENCOUNTER — Ambulatory Visit (INDEPENDENT_AMBULATORY_CARE_PROVIDER_SITE_OTHER): Payer: Medicare Other | Admitting: Nurse Practitioner

## 2014-09-08 ENCOUNTER — Encounter: Payer: Self-pay | Admitting: Nurse Practitioner

## 2014-09-08 VITALS — BP 124/57 | HR 64 | Temp 96.5°F | Ht 63.0 in | Wt 158.0 lb

## 2014-09-08 DIAGNOSIS — E034 Atrophy of thyroid (acquired): Secondary | ICD-10-CM | POA: Diagnosis not present

## 2014-09-08 DIAGNOSIS — Z23 Encounter for immunization: Secondary | ICD-10-CM

## 2014-09-08 DIAGNOSIS — E785 Hyperlipidemia, unspecified: Secondary | ICD-10-CM | POA: Diagnosis not present

## 2014-09-08 DIAGNOSIS — Z6827 Body mass index (BMI) 27.0-27.9, adult: Secondary | ICD-10-CM

## 2014-09-08 DIAGNOSIS — E038 Other specified hypothyroidism: Secondary | ICD-10-CM

## 2014-09-08 DIAGNOSIS — K219 Gastro-esophageal reflux disease without esophagitis: Secondary | ICD-10-CM | POA: Diagnosis not present

## 2014-09-08 DIAGNOSIS — F411 Generalized anxiety disorder: Secondary | ICD-10-CM | POA: Diagnosis not present

## 2014-09-08 DIAGNOSIS — M8580 Other specified disorders of bone density and structure, unspecified site: Secondary | ICD-10-CM | POA: Diagnosis not present

## 2014-09-08 DIAGNOSIS — I10 Essential (primary) hypertension: Secondary | ICD-10-CM | POA: Diagnosis not present

## 2014-09-08 MED ORDER — ATENOLOL 50 MG PO TABS: 50.0000 mg | ORAL_TABLET | Freq: Every day | ORAL | Status: DC

## 2014-09-08 MED ORDER — OMEPRAZOLE 20 MG PO CPDR: 20.0000 mg | DELAYED_RELEASE_CAPSULE | Freq: Every day | ORAL | Status: DC

## 2014-09-08 MED ORDER — SIMVASTATIN 40 MG PO TABS: 40.0000 mg | ORAL_TABLET | Freq: Every day | ORAL | Status: DC

## 2014-09-08 MED ORDER — LEVOTHYROXINE SODIUM 75 MCG PO TABS: 75.0000 ug | ORAL_TABLET | Freq: Every day | ORAL | Status: DC

## 2014-09-08 NOTE — Patient Instructions (Signed)
Fat and Cholesterol Control Diet Fat and cholesterol levels in your blood and organs are influenced by your diet. High levels of fat and cholesterol may lead to diseases of the heart, small and large blood vessels, gallbladder, liver, and pancreas. CONTROLLING FAT AND CHOLESTEROL WITH DIET Although exercise and lifestyle factors are important, your diet is key. That is because certain foods are known to raise cholesterol and others to lower it. The goal is to balance foods for their effect on cholesterol and more importantly, to replace saturated and trans fat with other types of fat, such as monounsaturated fat, polyunsaturated fat, and omega-3 fatty acids. On average, a person should consume no more than 15 to 17 g of saturated fat daily. Saturated and trans fats are considered "bad" fats, and they will raise LDL cholesterol. Saturated fats are primarily found in animal products such as meats, butter, and cream. However, that does not mean you need to give up all your favorite foods. Today, there are good tasting, low-fat, low-cholesterol substitutes for most of the things you like to eat. Choose low-fat or nonfat alternatives. Choose round or loin cuts of red meat. These types of cuts are lowest in fat and cholesterol. Chicken (without the skin), fish, veal, and ground turkey breast are great choices. Eliminate fatty meats, such as hot dogs and salami. Even shellfish have little or no saturated fat. Have a 3 oz (85 g) portion when you eat lean meat, poultry, or fish. Trans fats are also called "partially hydrogenated oils." They are oils that have been scientifically manipulated so that they are solid at room temperature resulting in a longer shelf life and improved taste and texture of foods in which they are added. Trans fats are found in stick margarine, some tub margarines, cookies, crackers, and baked goods.  When baking and cooking, oils are a great substitute for butter. The monounsaturated oils are  especially beneficial since it is believed they lower LDL and raise HDL. The oils you should avoid entirely are saturated tropical oils, such as coconut and palm.  Remember to eat a lot from food groups that are naturally free of saturated and trans fat, including fish, fruit, vegetables, beans, grains (barley, rice, couscous, bulgur wheat), and pasta (without cream sauces).  IDENTIFYING FOODS THAT LOWER FAT AND CHOLESTEROL  Soluble fiber may lower your cholesterol. This type of fiber is found in fruits such as apples, vegetables such as broccoli, potatoes, and carrots, legumes such as beans, peas, and lentils, and grains such as barley. Foods fortified with plant sterols (phytosterol) may also lower cholesterol. You should eat at least 2 g per day of these foods for a cholesterol lowering effect.  Read package labels to identify low-saturated fats, trans fat free, and low-fat foods at the supermarket. Select cheeses that have only 2 to 3 g saturated fat per ounce. Use a heart-healthy tub margarine that is free of trans fats or partially hydrogenated oil. When buying baked goods (cookies, crackers), avoid partially hydrogenated oils. Breads and muffins should be made from whole grains (whole-wheat or whole oat flour, instead of "flour" or "enriched flour"). Buy non-creamy canned soups with reduced salt and no added fats.  FOOD PREPARATION TECHNIQUES  Never deep-fry. If you must fry, either stir-fry, which uses very little fat, or use non-stick cooking sprays. When possible, broil, bake, or roast meats, and steam vegetables. Instead of putting butter or margarine on vegetables, use lemon and herbs, applesauce, and cinnamon (for squash and sweet potatoes). Use nonfat   yogurt, salsa, and low-fat dressings for salads.  LOW-SATURATED FAT / LOW-FAT FOOD SUBSTITUTES Meats / Saturated Fat (g)  Avoid: Steak, marbled (3 oz/85 g) / 11 g  Choose: Steak, lean (3 oz/85 g) / 4 g  Avoid: Hamburger (3 oz/85 g) / 7  g  Choose: Hamburger, lean (3 oz/85 g) / 5 g  Avoid: Ham (3 oz/85 g) / 6 g  Choose: Ham, lean cut (3 oz/85 g) / 2.4 g  Avoid: Chicken, with skin, dark meat (3 oz/85 g) / 4 g  Choose: Chicken, skin removed, dark meat (3 oz/85 g) / 2 g  Avoid: Chicken, with skin, light meat (3 oz/85 g) / 2.5 g  Choose: Chicken, skin removed, light meat (3 oz/85 g) / 1 g Dairy / Saturated Fat (g)  Avoid: Whole milk (1 cup) / 5 g  Choose: Low-fat milk, 2% (1 cup) / 3 g  Choose: Low-fat milk, 1% (1 cup) / 1.5 g  Choose: Skim milk (1 cup) / 0.3 g  Avoid: Hard cheese (1 oz/28 g) / 6 g  Choose: Skim milk cheese (1 oz/28 g) / 2 to 3 g  Avoid: Cottage cheese, 4% fat (1 cup) / 6.5 g  Choose: Low-fat cottage cheese, 1% fat (1 cup) / 1.5 g  Avoid: Ice cream (1 cup) / 9 g  Choose: Sherbet (1 cup) / 2.5 g  Choose: Nonfat frozen yogurt (1 cup) / 0.3 g  Choose: Frozen fruit bar / trace  Avoid: Whipped cream (1 tbs) / 3.5 g  Choose: Nondairy whipped topping (1 tbs) / 1 g Condiments / Saturated Fat (g)  Avoid: Mayonnaise (1 tbs) / 2 g  Choose: Low-fat mayonnaise (1 tbs) / 1 g  Avoid: Butter (1 tbs) / 7 g  Choose: Extra light margarine (1 tbs) / 1 g  Avoid: Coconut oil (1 tbs) / 11.8 g  Choose: Olive oil (1 tbs) / 1.8 g  Choose: Corn oil (1 tbs) / 1.7 g  Choose: Safflower oil (1 tbs) / 1.2 g  Choose: Sunflower oil (1 tbs) / 1.4 g  Choose: Soybean oil (1 tbs) / 2.4 g  Choose: Canola oil (1 tbs) / 1 g Document Released: 01/17/2005 Document Revised: 05/14/2012 Document Reviewed: 04/17/2013 [employer] Patient Information 2015 [employer]. This information is not intended to replace advice given to you by your health care provider. Make sure you discuss any questions you have with your health care provider.  

## 2014-09-08 NOTE — Addendum Note (Signed)
Addended by: Chevis Pretty on: 09/08/2014 10:37 AM   Modules accepted: Level of Service

## 2014-09-08 NOTE — Addendum Note (Signed)
Addended by: Rolena Infante on: 09/08/2014 02:47 PM   Modules accepted: Orders

## 2014-09-08 NOTE — Progress Notes (Signed)
Subjective:    Patient ID: Janice Zamora, female    DOB: 1935-08-17, 79 y.o.   MRN: [id number]   Patient here today for follow up- she is doing well without complaints.   Hypertension This is a chronic problem. The current episode started more than 1 year ago. The problem is unchanged. The problem is controlled. Pertinent negatives include no chest pain, headaches, neck pain, palpitations or shortness of breath. Risk factors for coronary artery disease include dyslipidemia and post-menopausal state. Past treatments include ACE inhibitors, diuretics and beta blockers. The current treatment provides moderate improvement. Compliance problems include exercise.  Hypertensive end-organ damage includes a thyroid problem.  Hyperlipidemia This is a chronic problem. The current episode started more than 1 year ago. The problem is controlled. Recent lipid tests were reviewed and are normal. Exacerbating diseases include hypothyroidism. She has no history of diabetes or obesity. Factors aggravating her hyperlipidemia include thiazides. Pertinent negatives include no chest pain, myalgias or shortness of breath. Current antihyperlipidemic treatment includes statins. The current treatment provides moderate improvement of lipids. Compliance problems include adherence to diet and adherence to exercise.  Risk factors for coronary artery disease include dyslipidemia, hypertension and post-menopausal.  Thyroid Problem Presents for follow-up (hypothyroidism) visit. Symptoms include fatigue (Mild fatigue after having diverticulitis and a viral stomach illness with diarrhea. ). Patient reports no constipation, diarrhea or palpitations. Her past medical history is significant for hyperlipidemia. There is no history of diabetes.  GAD She takes 1-2 0.5 mg lorazepam as needed per week. She states she becomes nervous and her mind starts racing unpredictably. The lorazepam works well.  GERD On omeprazole $RemoveBefor'20mg'NeNApbLZmgHj$  daily- keeps  symptoms under control. States her symptoms come back if she forgets to take this for a day or two.      Review of Systems  Constitutional: Positive for appetite change (Decreased appetite. ) and fatigue (Mild fatigue after having diverticulitis and a viral stomach illness with diarrhea. ). Negative for activity change.  HENT: Negative.   Respiratory: Negative for shortness of breath.   Cardiovascular: Negative for chest pain, palpitations and leg swelling.  Gastrointestinal: Negative for abdominal pain, diarrhea, constipation, blood in stool and abdominal distention.  Genitourinary: Negative.   Musculoskeletal: Negative for myalgias, arthralgias and neck pain.  Neurological: Negative for dizziness, light-headedness and headaches.  All other systems reviewed and are negative.      Objective:   Physical Exam  Constitutional: She is oriented to person, place, and time. She appears well-developed and well-nourished.  HENT:  Nose: Nose normal.  Mouth/Throat: Oropharynx is clear and moist.  Eyes: EOM are normal.  Neck: Trachea normal, normal range of motion and full passive range of motion without pain. Neck supple. No JVD present. Carotid bruit is not present. No thyromegaly present.  Cardiovascular: Normal rate, regular rhythm, normal heart sounds and intact distal pulses.  Exam reveals no gallop and no friction rub.   No murmur heard. Pulmonary/Chest: Effort normal and breath sounds normal.  Abdominal: Soft. Bowel sounds are normal. She exhibits no distension and no mass. There is no tenderness.  Musculoskeletal: Normal range of motion.  Lymphadenopathy:    She has no cervical adenopathy.  Neurological: She is alert and oriented to person, place, and time. She has normal reflexes.  Skin: Skin is warm and dry.  Psychiatric: She has a normal mood and affect. Her behavior is normal. Judgment and thought content normal.  Vitals reviewed.  BP 124/57 mmHg  Pulse 64  Temp(Src) 96.5 F  (35.8  C) (Oral)  Ht $R'5\' 3"'Jc$  (1.6 m)  Wt 158 lb (71.668 kg)  BMI 28.00 kg/m2     Assessment & Plan:   1. Essential hypertension Do ont add salt to diet - atenolol (TENORMIN) 50 MG tablet; Take 1 tablet (50 mg total) by mouth daily.  Dispense: 30 tablet; Refill: 5 - CMP14+EGFR  2. Gastroesophageal reflux disease without esophagitis Avoid spicy foods Do not eat 2 hours prior to bedtime - omeprazole (PRILOSEC) 20 MG capsule; Take 1 capsule (20 mg total) by mouth daily.  Dispense: 30 capsule; Refill: 5  3. Hypothyroidism due to acquired atrophy of thyroid - levothyroxine (SYNTHROID) 75 MCG tablet; Take 1 tablet (75 mcg total) by mouth daily before breakfast.  Dispense: 30 tablet; Refill: 5  4. Osteopenia Weight bearing exercises  5. Hyperlipidemia Low fat diet - simvastatin (ZOCOR) 40 MG tablet; Take 1 tablet (40 mg total) by mouth at bedtime.  Dispense: 30 tablet; Refill: 5 - Lipid panel  6. GAD (generalized anxiety disorder) Stress management   7. BMI 27.0-27.9,adult Discussed diet and exercise for person with BMI >25 Will recheck weight in 3-6 months     Labs pending Health maintenance reviewed Diet and exercise encouraged Continue all meds Follow up  In 3 months   [place], FNP

## 2014-09-09 LAB — LIPID PANEL
Chol/HDL Ratio: 3.4 ratio units (ref 0.0–4.4)
Cholesterol, Total: 148 mg/dL (ref 100–199)
HDL: 44 mg/dL (ref >39)
LDL Calculated: 76 mg/dL (ref 0–99)
Triglycerides: 141 mg/dL (ref 0–149)
VLDL Cholesterol Cal: 28 mg/dL (ref 5–40)

## 2014-09-09 LAB — CMP14+EGFR
ALT: 13 IU/L (ref 0–32)
AST: 15 IU/L (ref 0–40)
Albumin/Globulin Ratio: 1.5 (ref 1.1–2.5)
Albumin: 4.1 g/dL (ref 3.5–4.8)
Alkaline Phosphatase: 70 IU/L (ref 39–117)
BUN/Creatinine Ratio: 18 (ref 11–26)
BUN: 17 mg/dL (ref 8–27)
Bilirubin Total: 0.4 mg/dL (ref 0.0–1.2)
CO2: 24 mmol/L (ref 18–29)
CREATININE: 0.92 mg/dL (ref 0.57–1.00)
Calcium: 9.9 mg/dL (ref 8.7–10.3)
Chloride: 96 mmol/L — ABNORMAL LOW (ref 97–108)
GFR calc Af Amer: 69 mL/min/{1.73_m2} (ref >59)
GFR calc non Af Amer: 60 mL/min/{1.73_m2} (ref >59)
GLUCOSE: 130 mg/dL — AB (ref 65–99)
Globulin, Total: 2.7 g/dL (ref 1.5–4.5)
POTASSIUM: 3.9 mmol/L (ref 3.5–5.2)
Sodium: 138 mmol/L (ref 134–144)
TOTAL PROTEIN: 6.8 g/dL (ref 6.0–8.5)

## 2014-10-20 ENCOUNTER — Ambulatory Visit (INDEPENDENT_AMBULATORY_CARE_PROVIDER_SITE_OTHER): Payer: Medicare Other | Admitting: Pharmacist

## 2014-10-20 ENCOUNTER — Encounter: Payer: Self-pay | Admitting: Pharmacist

## 2014-10-20 VITALS — BP 122/60 | HR 68 | Ht 62.0 in | Wt 156.8 lb

## 2014-10-20 DIAGNOSIS — Z0000 Encounter for general adult medical examination without abnormal findings: Secondary | ICD-10-CM

## 2014-10-20 DIAGNOSIS — E8881 Metabolic syndrome: Secondary | ICD-10-CM | POA: Diagnosis not present

## 2014-10-20 DIAGNOSIS — R7303 Prediabetes: Secondary | ICD-10-CM

## 2014-10-20 DIAGNOSIS — R7301 Impaired fasting glucose: Secondary | ICD-10-CM | POA: Diagnosis not present

## 2014-10-20 DIAGNOSIS — R7309 Other abnormal glucose: Secondary | ICD-10-CM | POA: Diagnosis not present

## 2014-10-20 LAB — POCT GLYCOSYLATED HEMOGLOBIN (HGB A1C): HEMOGLOBIN A1C: 6.3

## 2014-10-20 MED ORDER — LORAZEPAM 0.5 MG PO TABS: 0.5000 mg | ORAL_TABLET | Freq: Two times a day (BID) | ORAL | Status: DC | PRN

## 2014-10-20 NOTE — Patient Instructions (Signed)
Janice Zamora , Thank you for taking time to come for your Medicare Wellness Visit. I appreciate your ongoing commitment to your health goals. Please review the following plan we discussed and let me know if I can assist you in the future.   These are the goals we discussed:  Continue to stay active and to limit calories  Recommend exercise - try 10 to 20 minutes daily to start and work up to 30 minutes daily if you can.  Increase non-starchy vegetables - carrots, green bean, squash, zucchini, tomatoes, onions, peppers, spinach and other green leafy vegetables, cabbage, lettuce, cucumbers, asparagus, okra (not fried), eggplant Limit potatoes and bread limit sugar and processed foods (cakes, cookies, ice cream, crackers and chips) Increase fresh fruit but limit serving sizes 1/2 cup or about the size of tennis or baseball limit red meat to no more than 1-2 times per week (serving size about the size of your palm) Choose whole grains / lean proteins - whole wheat bread, quinoa, whole grain rice (1/2 cup), fish, chicken, [place]    This is a list of the screening recommended for you and due dates:  Health Maintenance  Topic Date Due  . Flu Shot  Appt made today for October 11th 2016 at 10am  . Shingles Vaccine  Now - Checked cost and is $95   . Tetanus Vaccine  Checked cost and is $59  . Mammogram  12/26/2014  . Pneumonia vaccines (2 of 2 - PPSV23) 09/08/2015  . Colon Cancer Screening  05/03/2022   Eye Exam Due now  . DEXA scan (bone density measurement)  Completed  *Topic was postponed. The date shown is not the original due date.    Prediabetes Many people have heard about type 2 diabetes, but its common precursor, prediabetes, doesn't get as much attention. Prediabetes is estimated by CDC to affect 86 million Americans (this includes 51% of people 65 years and older), and an estimated 90% of people with prediabetes don't even know it. According to the CDC, 15-30% of these individuals  will develop type 2 diabetes within five years. In other words, as many as 26 million people that currently have prediabetes could develop type 2 diabetes by 2020, effectively doubling the number of people with type 2 diabetes in the [place].  What is prediabetes? Prediabetes is a condition where blood sugar levels are higher than normal, but not high enough to be diagnosed as type 2 diabetes. This occurs when the body has problems in processing glucose properly, and sugar starts to build up in the bloodstream instead of fueling cells in muscles and tissues. Insulin is the hormone that tells cells to take up glucose, and in prediabetes, people typically initially develop insulin resistance (where the body's cells can't respond to insulin as well), and over time (if no actions are taken to reverse the situation) the ability to produce sufficient insulin is reduced. People with prediabetes also commonly have high blood pressure as well as abnormal blood lipids (e.g. cholesterol). These often occur prior to the rise of blood glucose levels.  What are the symptoms of prediabetes? People typically do not have symptoms of prediabetes, which is partially why up to 90% of people don't know they have it. The ADA reports that some people with prediabetes may develop symptoms of type 2 diabetes, though even many people diagnosed with type 2 diabetes show little or no symptoms initially at diagnosis.  How is prediabetes diagnosed? According to the American Diabetes Association, prediabetes can  be diagnosed through one of the following tests: 1. A glycated hemoglobin test, also known as HbA1c or simply A1c, gives an idea of the body's average blood sugar levels from the past two or three months. It is usually done with a small drop of blood from a fingerstick or as part of having blood taken in a doctor's office, hospital, or laboratory.  Your A1c was 6.3% today   A1c Level Diagnosis  Less than 5.7% Normal  5.7% to 6.4%  Prediabetes  6.5% and higher Diabetes    2. A fasting plasma glucose (FPG) test measures a person's blood glucose level after fasting (not eating) for eight hours - this is typically done in the morning. If a test shows positive for prediabetes, a second test should be taken on a different day to confirm the diagnosis. FPG Level Diagnosis  Less than 100 mg/dl Normal  100 mg/dl to 125 mg/dl Prediabetes  126 mg/dl and higher Diabetes   Who is at risk of developing prediabetes? A well-known paper published in the Lancet in 2010 recommends screening for type 2 diabetes (which would also screen for prediabetes) every 3-5 years in all adults over the age of 79, regardless of other risk factors. Overweight and obese adults (a BMI >25 kg/m2) are also at significantly greater risk for developing prediabetes, as well as people with a family history of type 2 diabetes. According to the CDC, several other factors can have moderate influences on prediabetes risk in addition to age, weight, and family history: People with an [place] American, Hispanic/Latino, American [place] American, or [place] racial or ethnic background. The 2015 ADA Standards of Medical Care recommendations suggest Asian Americans with a BMI of 23 or above be screened for type 2 diabetes.  Women with a history of diabetes during pregnancy ("gestational diabetes") or have given birth to a baby weighing nine pounds or more. People who are physically active fewer than three times a week. The CDC offers a fast, online screening test for evaluating the risk for prediabetes. The ADA also offers a screening test to assess type 2 diabetes risk. Of course, these tests do not themselves confirm a prediabetes diagnosis, but just if someone may be at higher risk of developing it.  Why do people develop prediabetes? Prediabetes develops through a combination of factors that are still being investigated. For sure, lifestyle factors (food,  exercise, stress, sleep) play a role, but family history and genetics certainly do as well. It is easy to assume that prediabetes is the result of being overweight, but the relationship is not that simple. While obesity is one underlying cause of insulin resistance, many overweight individuals may never develop prediabetes or type 2 diabetes, and a minority of people with prediabetes have never been overweight. To make matters worse, it can be increasingly difficult to make healthy choices in today's toxic food environment that steers all of [place] to make the wrong food choices, and there are many factors that can contribute to weight gain in addition to diet.  Is a prediabetes diagnosis serious? There has been significant debate around the term 'prediabetes,' and whether it should be considered cause for alarm. On the one hand, it serves as a risk factor for type 2 diabetes and a host of other complications, including heart disease, and ultimately prediabetes implies that a degree of metabolic problems have started to occur in the body. On the other hand, it places a diagnosis on many people who may never develop  type 2 diabetes. Again, according to the CDC, 15-30% of those with prediabetes will develop type 2 diabetes within five years. However, a 2012 Lancet article cites 5-10% of those with prediabetes each year will also revert back to healthy blood sugars. What's critical is not necessarily the cutoff itself, but where someone falls within the ranges listed above. The level of risk of developing type 2 diabetes is closely related to A1c or FPG at diagnosis. Those in the higher ranges (A1c closer to 6.4%, FPG closer to 125 mg/dl) are much more likely to progress to type 2 diabetes, whereas those at lower ranges (A1c closer to 5.7%, FPG closer to 100 mg/dl) are relatively more likely to revert back to normal glucose levels or stay within the prediabetes range. Age of diagnosis and the level of insulin production  still occurring at diagnosis also impact the chances of reverting to normoglycemia (normal blood sugar levels).  What can people with prediabetes do to avoid the progression from prediabetes to type 2 diabetes? The most important action people diagnosed with prediabetes can take is to focus on living a healthy lifestyle. This includes making healthy food choices, controlling portions, and increasing physical activity. Regarding weight control, research shows losing 5-7% (often about 10-20 lbs.) from your initial body weight and keeping off as much of that weight over time as possible is critical to lowering the risk of type 2 diabetes. This task is of course easier said than done, but sustained weight loss over time can be key to improving health and delaying or preventing the onset of type 2 diabetes. Several prediabetes interventions exist based on evidence from the landmark Diabetes Prevention Program (DPP) study. The DPP study reported that moderate weight loss (5-7% of body weight, or ~10-15 lbs. for someone weighing 200 lbs.), counseling, and education on healthy eating and behavior reduced the risk of developing type 2 diabetes by 58%. Data presented at the [place] 2014 conference showed that after 15 years of follow-up of the DPP study groups, the results were still encouraging: 27% of those in the original lifestyle group had a significant reduction in type 2 diabetes progression compared to the control group. If you or someone you know has been told they have prediabetes, here are a few helpful resources: In-person diabetes prevention programs: The CDC offers a one year long lifestyle change program through its National Diabetes Prevention Program (NDPP) at various locations throughout the [place] to help participants adopt healthy habits and prevent or delay progression to type 2 diabetes. This program is a major undertaking by the CDC to translate the findings from the DPP study into a real world setting, a  significant effort indeed! Online diabetes prevention programs: The CDC has now given pending recognition status to three digital prevention programs: [place], and [facility]. These offer the same one year long educational curriculum as the DPP study, but in an online format. Some insurance companies and employers cover these programs, and you can find more information at the links above. These digital versions are excellent options for those who live far away from NDPP locations or who prefer the anonymity and convenience of doing the program online. Metformin: The DPP study found that metformin, the safest first-line therapy for type 2 diabetes, may help delay the onset of type 2 diabetes in people with prediabetes. Participants who took the low-cost generic drug had a 31% reduced risk of developing type 2 diabetes compared to the control group (those not on  metformin or intensive lifestyle intervention). Again, 15-year follow up data showed that 17% of those on metformin continued to have a significant reduction in type 2 progression. At this time, metformin (or any other medication, for that matter) is not currently FDA approved for prediabetes, and it is sometimes prescribed "off-label" by a healthcare provider. Your healthcare provider can give you more information and determine whether metformin is a good option for you.  Can prediabetes be "cured"? In the early stages of prediabetes (and type 2 diabetes), diligent attention to food choices and activity, and most importantly weight loss, can improve blood sugar numbers, effectively "reversing" the disease and reducing the odds of developing type 2 diabetes. However, some people may have underlying factors (such as family history and genetics) that put them at a greater risk of type 2 diabetes, meaning they will always require careful attention to blood sugar levels and lifestyle choices. Returning to old habits will likely put someone  back on the road to prediabetes, and eventually, type 2 diabetes    Health Maintenance Adopting a healthy lifestyle and getting preventive care can go a long way to promote health and wellness. Talk with your health care provider about what schedule of regular examinations is right for you. This is a good chance for you to check in with your provider about disease prevention and staying healthy. In between checkups, there are plenty of things you can do on your own. Experts have done a lot of research about which lifestyle changes and preventive measures are most likely to keep you healthy. Ask your health care provider for more information. WEIGHT AND DIET  Eat a healthy diet  Be sure to include plenty of vegetables, fruits, low-fat dairy products, and lean protein.  Do not eat a lot of foods high in solid fats, added sugars, or salt.  Get regular exercise. This is one of the most important things you can do for your health.  Most adults should exercise for at least 150 minutes each week. The exercise should increase your heart rate and make you sweat (moderate-intensity exercise).  Most adults should also do strengthening exercises at least twice a week. This is in addition to the moderate-intensity exercise.  Maintain a healthy weight  Body mass index (BMI) is a measurement that can be used to identify possible weight problems. It estimates body fat based on height and weight. Your health care provider can help determine your BMI and help you achieve or maintain a healthy weight.  For females 59 years of age and older:   A BMI below 18.5 is considered underweight.  A BMI of 18.5 to 24.9 is normal.  A BMI of 25 to 29.9 is considered overweight.  A BMI of 30 and above is considered obese.  Watch levels of cholesterol and blood lipids  You should start having your blood tested for lipids and cholesterol at 77 years of age, then have this test every 5 years.  You may need to  have your cholesterol levels checked more often if:  Your lipid or cholesterol levels are high.  You are older than 77 years of age.  You are at high risk for heart disease.  CANCER SCREENING   Lung Cancer  Lung cancer screening is recommended for adults 70-65 years old who are at high risk for lung cancer because of a history of smoking.  A yearly low-dose CT scan of the lungs is recommended for people who:  Currently smoke.  Have quit  within the past 15 years.  Have at least a 30-pack-year history of smoking. A pack year is smoking an average of one pack of cigarettes a day for 1 year.  Yearly screening should continue until it has been 15 years since you quit.  Yearly screening should stop if you develop a health problem that would prevent you from having lung cancer treatment.  Breast Cancer  Practice breast self-awareness. This means understanding how your breasts normally appear and feel.  It also means doing regular breast self-exams. Let your health care provider know about any changes, no matter how small.  If you are in your 20s or 30s, you should have a clinical breast exam (CBE) by a health care provider every 1-3 years as part of a regular health exam.  If you are 52 or older, have a CBE every year. Also consider having a breast X-ray (mammogram) every year.  If you have a family history of breast cancer, talk to your health care provider about genetic screening.  If you are at high risk for breast cancer, talk to your health care provider about having an MRI and a mammogram every year.  Breast cancer gene (BRCA) assessment is recommended for women who have family members with BRCA-related cancers. BRCA-related cancers include:  Breast.  Ovarian.  Tubal.  Peritoneal cancers.  Results of the assessment will determine the need for genetic counseling and BRCA1 and BRCA2 testing. Cervical Cancer Routine pelvic examinations to screen for cervical cancer  are no longer recommended for nonpregnant women who are considered low risk for cancer of the pelvic organs (ovaries, uterus, and vagina) and who do not have symptoms. A pelvic examination may be necessary if you have symptoms including those associated with pelvic infections. Ask your health care provider if a screening pelvic exam is right for you.   The Pap test is the screening test for cervical cancer for women who are considered at risk.  If you had a hysterectomy for a problem that was not cancer or a condition that could lead to cancer, then you no longer need Pap tests.  If you are older than 65 years, and you have had normal Pap tests for the past 10 years, you no longer need to have Pap tests.  If you have had past treatment for cervical cancer or a condition that could lead to cancer, you need Pap tests and screening for cancer for at least 20 years after your treatment.  If you no longer get a Pap test, assess your risk factors if they change (such as having a new sexual partner). This can affect whether you should start being screened again.  Some women have medical problems that increase their chance of getting cervical cancer. If this is the case for you, your health care provider may recommend more frequent screening and Pap tests.  The human papillomavirus (HPV) test is another test that may be used for cervical cancer screening. The HPV test looks for the virus that can cause cell changes in the cervix. The cells collected during the Pap test can be tested for HPV.  The HPV test can be used to screen women 60 years of age and older. Getting tested for HPV can extend the interval between normal Pap tests from three to five years.  An HPV test also should be used to screen women of any age who have unclear Pap test results.  After 77 years of age, women should have HPV testing as often  as Pap tests.  Colorectal Cancer  This type of cancer can be detected and often  prevented.  Routine colorectal cancer screening usually begins at 77 years of age and continues through 77 years of age.  Your health care provider may recommend screening at an earlier age if you have risk factors for colon cancer.  Your health care provider may also recommend using home test kits to check for hidden blood in the stool.  A small camera at the end of a tube can be used to examine your colon directly (sigmoidoscopy or colonoscopy). This is done to check for the earliest forms of colorectal cancer.  Routine screening usually begins at age 62.  Direct examination of the colon should be repeated every 5-10 years through 77 years of age. However, you may need to be screened more often if early forms of precancerous polyps or small growths are found. Skin Cancer  Check your skin from head to toe regularly.  Tell your health care provider about any new moles or changes in moles, especially if there is a change in a mole's shape or color.  Also tell your health care provider if you have a mole that is larger than the size of a pencil eraser.  Always use sunscreen. Apply sunscreen liberally and repeatedly throughout the day.  Protect yourself by wearing long sleeves, pants, a wide-brimmed hat, and sunglasses whenever you are outside. HEART DISEASE, DIABETES, AND HIGH BLOOD PRESSURE   Have your blood pressure checked at least every 1-2 years. High blood pressure causes heart disease and increases the risk of stroke.  If you are between 70 years and 54 years old, ask your health care provider if you should take aspirin to prevent strokes.  Have regular diabetes screenings. This involves taking a blood sample to check your fasting blood sugar level.  If you are at a normal weight and have a low risk for diabetes, have this test once every three years after 77 years of age.  If you are overweight and have a high risk for diabetes, consider being tested at a younger age or more  often. PREVENTING INFECTION  Hepatitis B  If you have a higher risk for hepatitis B, you should be screened for this virus. You are considered at high risk for hepatitis B if:  You were born in a country where hepatitis B is common. Ask your health care provider which countries are considered high risk.  Your parents were born in a high-risk country, and you have not been immunized against hepatitis B (hepatitis B vaccine).  You have HIV or AIDS.  You use needles to inject street drugs.  You live with someone who has hepatitis B.  You have had sex with someone who has hepatitis B.  You get hemodialysis treatment.  You take certain medicines for conditions, including cancer, organ transplantation, and autoimmune conditions. Hepatitis C  Blood testing is recommended for:  Everyone born from [birth year] through 1963.  Anyone with known risk factors for hepatitis C. Sexually transmitted infections (STIs)  You should be screened for sexually transmitted infections (STIs) including gonorrhea and chlamydia if:  You are sexually active and are younger than 77 years of age.  You are older than 77 years of age and your health care provider tells you that you are at risk for this type of infection.  Your sexual activity has changed since you were last screened and you are at an increased risk for chlamydia or gonorrhea. Ask  your health care provider if you are at risk.  If you do not have HIV, but are at risk, it may be recommended that you take a prescription medicine daily to prevent HIV infection. This is called pre-exposure prophylaxis (PrEP). You are considered at risk if:  You are sexually active and do not regularly use condoms or know the HIV status of your partner(s).  You take drugs by injection.  You are sexually active with a partner who has HIV. Talk with your health care provider about whether you are at high risk of being infected with HIV. If you choose to begin PrEP, you  should first be tested for HIV. You should then be tested every 3 months for as long as you are taking PrEP.  PREGNANCY   If you are premenopausal and you may become pregnant, ask your health care provider about preconception counseling.  If you may become pregnant, take 400 to 800 micrograms (mcg) of folic acid every day.  If you want to prevent pregnancy, talk to your health care provider about birth control (contraception). OSTEOPOROSIS AND MENOPAUSE   Osteoporosis is a disease in which the bones lose minerals and strength with aging. This can result in serious bone fractures. Your risk for osteoporosis can be identified using a bone density scan.  If you are 79 years of age or older, or if you are at risk for osteoporosis and fractures, ask your health care provider if you should be screened.  Ask your health care provider whether you should take a calcium or vitamin D supplement to lower your risk for osteoporosis.  Menopause may have certain physical symptoms and risks.  Hormone replacement therapy may reduce some of these symptoms and risks. Talk to your health care provider about whether hormone replacement therapy is right for you.  HOME CARE INSTRUCTIONS   Schedule regular health, dental, and eye exams.  Stay current with your immunizations.   Do not use any tobacco products including cigarettes, chewing tobacco, or electronic cigarettes.  If you are pregnant, do not drink alcohol.  If you are breastfeeding, limit how much and how often you drink alcohol.  Limit alcohol intake to no more than 1 drink per day for nonpregnant women. One drink equals 12 ounces of beer, 5 ounces of wine, or 1 ounces of hard liquor.  Do not use street drugs.  Do not share needles.  Ask your health care provider for help if you need support or information about quitting drugs.  Tell your health care provider if you often feel depressed.  Tell your health care provider if you have ever  been abused or do not feel safe at home. Document Released: 08/02/2010 Document Revised: 06/03/2013 Document Reviewed: 12/19/2012 [facility] Patient Information 2015 [place], [place]. This information is not intended to replace advice given to you by your health care provider. Make sure you discuss any questions you have with your health care provider.

## 2014-10-20 NOTE — Patient Instructions (Signed)
Janice Zamora , Thank you for taking time to come for your Medicare Wellness Visit. I appreciate your ongoing commitment to your health goals. Please review the following plan we discussed and let me know if I can assist you in the future.   These are the goals we discussed:  Continue to stay active and to limit calories  Recommend exercise - try 10 to 20 minutes daily to start and work up to 30 minutes daily if you can.  Increase non-starchy vegetables - carrots, green bean, squash, zucchini, tomatoes, onions, peppers, spinach and other green leafy vegetables, cabbage, lettuce, cucumbers, asparagus, okra (not fried), eggplant Limit potatoes and bread limit sugar and processed foods (cakes, cookies, ice cream, crackers and chips) Increase fresh fruit but limit serving sizes 1/2 cup or about the size of tennis or baseball limit red meat to no more than 1-2 times per week (serving size about the size of your palm) Choose whole grains / lean proteins - whole wheat bread, quinoa, whole grain rice (1/2 cup), fish, chicken, turkey    This is a list of the screening recommended for you and due dates:  Health Maintenance  Topic Date Due  . Flu Shot  Appt made today for October 11th 2016 at 10am  . Shingles Vaccine  Now - Checked cost and is $95   . Tetanus Vaccine  Checked cost and is $59  . Mammogram  12/26/2014  . Pneumonia vaccines (2 of 2 - PPSV23) 09/08/2015  . Colon Cancer Screening  05/03/2022   Eye Exam Due now  . DEXA scan (bone density measurement)  Completed  *Topic was postponed. The date shown is not the original due date.    Prediabetes Many people have heard about type 2 diabetes, but its common precursor, prediabetes, doesn't get as much attention. Prediabetes is estimated by CDC to affect 86 million Americans (this includes 51% of people 65 years and older), and an estimated 90% of people with prediabetes don't even know it. According to the CDC, 15-30% of these individuals  will develop type 2 diabetes within five years. In other words, as many as 26 million people that currently have prediabetes could develop type 2 diabetes by 2020, effectively doubling the number of people with type 2 diabetes in the [place].  What is prediabetes? Prediabetes is a condition where blood sugar levels are higher than normal, but not high enough to be diagnosed as type 2 diabetes. This occurs when the body has problems in processing glucose properly, and sugar starts to build up in the bloodstream instead of fueling cells in muscles and tissues. Insulin is the hormone that tells cells to take up glucose, and in prediabetes, people typically initially develop insulin resistance (where the body's cells can't respond to insulin as well), and over time (if no actions are taken to reverse the situation) the ability to produce sufficient insulin is reduced. People with prediabetes also commonly have high blood pressure as well as abnormal blood lipids (e.g. cholesterol). These often occur prior to the rise of blood glucose levels.  What are the symptoms of prediabetes? People typically do not have symptoms of prediabetes, which is partially why up to 90% of people don't know they have it. The ADA reports that some people with prediabetes may develop symptoms of type 2 diabetes, though even many people diagnosed with type 2 diabetes show little or no symptoms initially at diagnosis.  How is prediabetes diagnosed? According to the American Diabetes Association, prediabetes can   be diagnosed through one of the following tests: 1. A glycated hemoglobin test, also known as HbA1c or simply A1c, gives an idea of the body's average blood sugar levels from the past two or three months. It is usually done with a small drop of blood from a fingerstick or as part of having blood taken in a doctor's office, hospital, or laboratory.  Your A1c was 6.3% today   A1c Level Diagnosis  Less than 5.7% Normal  5.7% to 6.4%  Prediabetes  6.5% and higher Diabetes    2. A fasting plasma glucose (FPG) test measures a person's blood glucose level after fasting (not eating) for eight hours - this is typically done in the morning. If a test shows positive for prediabetes, a second test should be taken on a different day to confirm the diagnosis. FPG Level Diagnosis  Less than 100 mg/dl Normal  100 mg/dl to 125 mg/dl Prediabetes  126 mg/dl and higher Diabetes   Who is at risk of developing prediabetes? A well-known paper published in the Lancet in 2010 recommends screening for type 2 diabetes (which would also screen for prediabetes) every 3-5 years in all adults over the age of 45, regardless of other risk factors. Overweight and obese adults (a BMI >25 kg/m2) are also at significantly greater risk for developing prediabetes, as well as people with a family history of type 2 diabetes. According to the CDC, several other factors can have moderate influences on prediabetes risk in addition to age, weight, and family history: People with an African American, Hispanic/Latino, American Indian, Asian American, or Pacific Islander racial or ethnic background. The 2015 ADA Standards of Medical Care recommendations suggest Asian Americans with a BMI of 23 or above be screened for type 2 diabetes.  Women with a history of diabetes during pregnancy ("gestational diabetes") or have given birth to a baby weighing nine pounds or more. People who are physically active fewer than three times a week. The CDC offers a fast, online screening test for evaluating the risk for prediabetes. The ADA also offers a screening test to assess type 2 diabetes risk. Of course, these tests do not themselves confirm a prediabetes diagnosis, but just if someone may be at higher risk of developing it.  Why do people develop prediabetes? Prediabetes develops through a combination of factors that are still being investigated. For sure, lifestyle factors (food,  exercise, stress, sleep) play a role, but family history and genetics certainly do as well. It is easy to assume that prediabetes is the result of being overweight, but the relationship is not that simple. While obesity is one underlying cause of insulin resistance, many overweight individuals may never develop prediabetes or type 2 diabetes, and a minority of people with prediabetes have never been overweight. To make matters worse, it can be increasingly difficult to make healthy choices in today's toxic food environment that steers all of us to make the wrong food choices, and there are many factors that can contribute to weight gain in addition to diet.  Is a prediabetes diagnosis serious? There has been significant debate around the term 'prediabetes,' and whether it should be considered cause for alarm. On the one hand, it serves as a risk factor for type 2 diabetes and a host of other complications, including heart disease, and ultimately prediabetes implies that a degree of metabolic problems have started to occur in the body. On the other hand, it places a diagnosis on many people who may never develop   type 2 diabetes. Again, according to the CDC, 15-30% of those with prediabetes will develop type 2 diabetes within five years. However, a 2012 Lancet article cites 5-10% of those with prediabetes each year will also revert back to healthy blood sugars. What's critical is not necessarily the cutoff itself, but where someone falls within the ranges listed above. The level of risk of developing type 2 diabetes is closely related to A1c or FPG at diagnosis. Those in the higher ranges (A1c closer to 6.4%, FPG closer to 125 mg/dl) are much more likely to progress to type 2 diabetes, whereas those at lower ranges (A1c closer to 5.7%, FPG closer to 100 mg/dl) are relatively more likely to revert back to normal glucose levels or stay within the prediabetes range. Age of diagnosis and the level of insulin production  still occurring at diagnosis also impact the chances of reverting to normoglycemia (normal blood sugar levels).  What can people with prediabetes do to avoid the progression from prediabetes to type 2 diabetes? The most important action people diagnosed with prediabetes can take is to focus on living a healthy lifestyle. This includes making healthy food choices, controlling portions, and increasing physical activity. Regarding weight control, research shows losing 5-7% (often about 10-20 lbs.) from your initial body weight and keeping off as much of that weight over time as possible is critical to lowering the risk of type 2 diabetes. This task is of course easier said than done, but sustained weight loss over time can be key to improving health and delaying or preventing the onset of type 2 diabetes. Several prediabetes interventions exist based on evidence from the landmark Diabetes Prevention Program (DPP) study. The DPP study reported that moderate weight loss (5-7% of body weight, or ~10-15 lbs. for someone weighing 200 lbs.), counseling, and education on healthy eating and behavior reduced the risk of developing type 2 diabetes by 58%. Data presented at the [place] 2014 conference showed that after 15 years of follow-up of the DPP study groups, the results were still encouraging: 27% of those in the original lifestyle group had a significant reduction in type 2 diabetes progression compared to the control group. If you or someone you know has been told they have prediabetes, here are a few helpful resources: In-person diabetes prevention programs: The CDC offers a one year long lifestyle change program through its National Diabetes Prevention Program (NDPP) at various locations throughout the [place] to help participants adopt healthy habits and prevent or delay progression to type 2 diabetes. This program is a major undertaking by the CDC to translate the findings from the DPP study into a real world setting, a  significant effort indeed! Online diabetes prevention programs: The CDC has now given pending recognition status to three digital prevention programs: [employer], and [facility]. These offer the same one year long educational curriculum as the DPP study, but in an online format. Some insurance companies and employers cover these programs, and you can find more information at the links above. These digital versions are excellent options for those who live far away from NDPP locations or who prefer the anonymity and convenience of doing the program online. Metformin: The DPP study found that metformin, the safest first-line therapy for type 2 diabetes, may help delay the onset of type 2 diabetes in people with prediabetes. Participants who took the low-cost generic drug had a 31% reduced risk of developing type 2 diabetes compared to the control group (those not on  metformin or intensive lifestyle intervention). Again, 15-year follow up data showed that 17% of those on metformin continued to have a significant reduction in type 2 progression. At this time, metformin (or any other medication, for that matter) is not currently FDA approved for prediabetes, and it is sometimes prescribed "off-label" by a healthcare provider. Your healthcare provider can give you more information and determine whether metformin is a good option for you.  Can prediabetes be "cured"? In the early stages of prediabetes (and type 2 diabetes), diligent attention to food choices and activity, and most importantly weight loss, can improve blood sugar numbers, effectively "reversing" the disease and reducing the odds of developing type 2 diabetes. However, some people may have underlying factors (such as family history and genetics) that put them at a greater risk of type 2 diabetes, meaning they will always require careful attention to blood sugar levels and lifestyle choices. Returning to old habits will likely put someone  back on the road to prediabetes, and eventually, type 2 diabetes    Health Maintenance Adopting a healthy lifestyle and getting preventive care can go a long way to promote health and wellness. Talk with your health care provider about what schedule of regular examinations is right for you. This is a good chance for you to check in with your provider about disease prevention and staying healthy. In between checkups, there are plenty of things you can do on your own. Experts have done a lot of research about which lifestyle changes and preventive measures are most likely to keep you healthy. Ask your health care provider for more information. WEIGHT AND DIET  Eat a healthy diet  Be sure to include plenty of vegetables, fruits, low-fat dairy products, and lean protein.  Do not eat a lot of foods high in solid fats, added sugars, or salt.  Get regular exercise. This is one of the most important things you can do for your health.  Most adults should exercise for at least 150 minutes each week. The exercise should increase your heart rate and make you sweat (moderate-intensity exercise).  Most adults should also do strengthening exercises at least twice a week. This is in addition to the moderate-intensity exercise.  Maintain a healthy weight  Body mass index (BMI) is a measurement that can be used to identify possible weight problems. It estimates body fat based on height and weight. Your health care provider can help determine your BMI and help you achieve or maintain a healthy weight.  For females 20 years of age and older:   A BMI below 18.5 is considered underweight.  A BMI of 18.5 to 24.9 is normal.  A BMI of 25 to 29.9 is considered overweight.  A BMI of 30 and above is considered obese.  Watch levels of cholesterol and blood lipids  You should start having your blood tested for lipids and cholesterol at 77 years of age, then have this test every 5 years.  You may need to  have your cholesterol levels checked more often if:  Your lipid or cholesterol levels are high.  You are older than 77 years of age.  You are at high risk for heart disease.  CANCER SCREENING   Lung Cancer  Lung cancer screening is recommended for adults 55-80 years old who are at high risk for lung cancer because of a history of smoking.  A yearly low-dose CT scan of the lungs is recommended for people who:  Currently smoke.  Have quit   within the past 15 years.  Have at least a 30-pack-year history of smoking. A pack year is smoking an average of one pack of cigarettes a day for 1 year.  Yearly screening should continue until it has been 15 years since you quit.  Yearly screening should stop if you develop a health problem that would prevent you from having lung cancer treatment.  Breast Cancer  Practice breast self-awareness. This means understanding how your breasts normally appear and feel.  It also means doing regular breast self-exams. Let your health care provider know about any changes, no matter how small.  If you are in your 20s or 30s, you should have a clinical breast exam (CBE) by a health care provider every 1-3 years as part of a regular health exam.  If you are 70 or older, have a CBE every year. Also consider having a breast X-ray (mammogram) every year.  If you have a family history of breast cancer, talk to your health care provider about genetic screening.  If you are at high risk for breast cancer, talk to your health care provider about having an MRI and a mammogram every year.  Breast cancer gene (BRCA) assessment is recommended for women who have family members with BRCA-related cancers. BRCA-related cancers include:  Breast.  Ovarian.  Tubal.  Peritoneal cancers.  Results of the assessment will determine the need for genetic counseling and BRCA1 and BRCA2 testing. Cervical Cancer Routine pelvic examinations to screen for cervical cancer  are no longer recommended for nonpregnant women who are considered low risk for cancer of the pelvic organs (ovaries, uterus, and vagina) and who do not have symptoms. A pelvic examination may be necessary if you have symptoms including those associated with pelvic infections. Ask your health care provider if a screening pelvic exam is right for you.   The Pap test is the screening test for cervical cancer for women who are considered at risk.  If you had a hysterectomy for a problem that was not cancer or a condition that could lead to cancer, then you no longer need Pap tests.  If you are older than 65 years, and you have had normal Pap tests for the past 10 years, you no longer need to have Pap tests.  If you have had past treatment for cervical cancer or a condition that could lead to cancer, you need Pap tests and screening for cancer for at least 20 years after your treatment.  If you no longer get a Pap test, assess your risk factors if they change (such as having a new sexual partner). This can affect whether you should start being screened again.  Some women have medical problems that increase their chance of getting cervical cancer. If this is the case for you, your health care provider may recommend more frequent screening and Pap tests.  The human papillomavirus (HPV) test is another test that may be used for cervical cancer screening. The HPV test looks for the virus that can cause cell changes in the cervix. The cells collected during the Pap test can be tested for HPV.  The HPV test can be used to screen women 31 years of age and older. Getting tested for HPV can extend the interval between normal Pap tests from three to five years.  An HPV test also should be used to screen women of any age who have unclear Pap test results.  After 77 years of age, women should have HPV testing as often  as Pap tests.  Colorectal Cancer  This type of cancer can be detected and often  prevented.  Routine colorectal cancer screening usually begins at 77 years of age and continues through 77 years of age.  Your health care provider may recommend screening at an earlier age if you have risk factors for colon cancer.  Your health care provider may also recommend using home test kits to check for hidden blood in the stool.  A small camera at the end of a tube can be used to examine your colon directly (sigmoidoscopy or colonoscopy). This is done to check for the earliest forms of colorectal cancer.  Routine screening usually begins at age 50.  Direct examination of the colon should be repeated every 5-10 years through 77 years of age. However, you may need to be screened more often if early forms of precancerous polyps or small growths are found. Skin Cancer  Check your skin from head to toe regularly.  Tell your health care provider about any new moles or changes in moles, especially if there is a change in a mole's shape or color.  Also tell your health care provider if you have a mole that is larger than the size of a pencil eraser.  Always use sunscreen. Apply sunscreen liberally and repeatedly throughout the day.  Protect yourself by wearing long sleeves, pants, a wide-brimmed hat, and sunglasses whenever you are outside. HEART DISEASE, DIABETES, AND HIGH BLOOD PRESSURE   Have your blood pressure checked at least every 1-2 years. High blood pressure causes heart disease and increases the risk of stroke.  If you are between 55 years and 79 years old, ask your health care provider if you should take aspirin to prevent strokes.  Have regular diabetes screenings. This involves taking a blood sample to check your fasting blood sugar level.  If you are at a normal weight and have a low risk for diabetes, have this test once every three years after 77 years of age.  If you are overweight and have a high risk for diabetes, consider being tested at a younger age or more  often. PREVENTING INFECTION  Hepatitis B  If you have a higher risk for hepatitis B, you should be screened for this virus. You are considered at high risk for hepatitis B if:  You were born in a country where hepatitis B is common. Ask your health care provider which countries are considered high risk.  Your parents were born in a high-risk country, and you have not been immunized against hepatitis B (hepatitis B vaccine).  You have HIV or AIDS.  You use needles to inject street drugs.  You live with someone who has hepatitis B.  You have had sex with someone who has hepatitis B.  You get hemodialysis treatment.  You take certain medicines for conditions, including cancer, organ transplantation, and autoimmune conditions. Hepatitis C  Blood testing is recommended for:  Everyone born from 1945 through 1965.  Anyone with known risk factors for hepatitis C. Sexually transmitted infections (STIs)  You should be screened for sexually transmitted infections (STIs) including gonorrhea and chlamydia if:  You are sexually active and are younger than 77 years of age.  You are older than 77 years of age and your health care provider tells you that you are at risk for this type of infection.  Your sexual activity has changed since you were last screened and you are at an increased risk for chlamydia or gonorrhea. Ask   your health care provider if you are at risk.  If you do not have HIV, but are at risk, it may be recommended that you take a prescription medicine daily to prevent HIV infection. This is called pre-exposure prophylaxis (PrEP). You are considered at risk if:  You are sexually active and do not regularly use condoms or know the HIV status of your partner(s).  You take drugs by injection.  You are sexually active with a partner who has HIV. Talk with your health care provider about whether you are at high risk of being infected with HIV. If you choose to begin PrEP, you  should first be tested for HIV. You should then be tested every 3 months for as long as you are taking PrEP.  PREGNANCY   If you are premenopausal and you may become pregnant, ask your health care provider about preconception counseling.  If you may become pregnant, take 400 to 800 micrograms (mcg) of folic acid every day.  If you want to prevent pregnancy, talk to your health care provider about birth control (contraception). OSTEOPOROSIS AND MENOPAUSE   Osteoporosis is a disease in which the bones lose minerals and strength with aging. This can result in serious bone fractures. Your risk for osteoporosis can be identified using a bone density scan.  If you are 65 years of age or older, or if you are at risk for osteoporosis and fractures, ask your health care provider if you should be screened.  Ask your health care provider whether you should take a calcium or vitamin D supplement to lower your risk for osteoporosis.  Menopause may have certain physical symptoms and risks.  Hormone replacement therapy may reduce some of these symptoms and risks. Talk to your health care provider about whether hormone replacement therapy is right for you.  HOME CARE INSTRUCTIONS   Schedule regular health, dental, and eye exams.  Stay current with your immunizations.   Do not use any tobacco products including cigarettes, chewing tobacco, or electronic cigarettes.  If you are pregnant, do not drink alcohol.  If you are breastfeeding, limit how much and how often you drink alcohol.  Limit alcohol intake to no more than 1 drink per day for nonpregnant women. One drink equals 12 ounces of beer, 5 ounces of wine, or 1 ounces of hard liquor.  Do not use street drugs.  Do not share needles.  Ask your health care provider for help if you need support or information about quitting drugs.  Tell your health care provider if you often feel depressed.  Tell your health care provider if you have ever  been abused or do not feel safe at home. Document Released: 08/02/2010 Document Revised: 06/03/2013 Document Reviewed: 12/19/2012 [employer] Patient Information 2015 [employer]. This information is not intended to replace advice given to you by your health care provider. Make sure you discuss any questions you have with your health care provider.   

## 2014-10-20 NOTE — Progress Notes (Signed)
Patient ID: Janice Zamora, female   DOB: Feb 24, 1935, 79 y.o.   MRN: [id number]    Subjective:   Janice Zamora is a 79 y.o. female who presents for an Initial Medicare Annual Wellness Visit.  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  She is widowed for 9 years.  She lives alone.    Current Medications (verified) Outpatient Encounter Prescriptions as of 10/20/2014  Medication Sig  . atenolol (TENORMIN) 50 MG tablet Take 1 tablet (50 mg total) by mouth daily.  . enalapril-hydrochlorothiazide (VASERETIC) 10-25 MG per tablet Take 1 tablet by mouth daily.  Marland Kitchen levothyroxine (SYNTHROID) 75 MCG tablet Take 1 tablet (75 mcg total) by mouth daily before breakfast.  . LORazepam (ATIVAN) 0.5 MG tablet TAKE ONE TABLET BY MOUTH TWICE DAILY AS NEEDED FOR ANXIETY  . omeprazole (PRILOSEC) 20 MG capsule Take 1 capsule (20 mg total) by mouth daily.  . simvastatin (ZOCOR) 40 MG tablet Take 1 tablet (40 mg total) by mouth at bedtime.  . Multiple Vitamin (MULTIVITAMIN WITH MINERALS) TABS tablet Take 1 tablet by mouth daily. (Patient not taking: Reported on 10/20/2014)   No facility-administered encounter medications on file as of 10/20/2014.    Allergies (verified) Review of patient's allergies indicates no known allergies.   History: Past Medical History  Diagnosis Date  . Hypertension   . Hypothyroidism   . GERD (gastroesophageal reflux disease)   . Fibrocystic breast disease   . Hyperlipidemia   . Metabolic syndrome   . GAD (generalized anxiety disorder)   . Diverticulosis   . Cholelithiasis   . Cataract   . Osteopenia    Past Surgical History  Procedure Laterality Date  . Cholecystectomy    . Breast surgery      fibriotic cyst removed   Family History  Problem Relation Age of Onset  . Hypertension Mother   . Hypertension Father   . Hyperlipidemia Sister   . Hyperlipidemia Brother   . Hyperlipidemia Sister   . Hyperlipidemia Sister    Social History   Occupational History  . homemaker    Social  History Main Topics  . Smoking status: Never Smoker   . Smokeless tobacco: Never Used  . Alcohol Use: No  . Drug Use: No  . Sexual Activity: No    Do you feel safe at home?  Yes  Dietary issues and exercise activities: Current Exercise Habits:: The patient has a physically strenous job, but has no regular exercise apart from work.  Current Dietary habits:  Patient is not following any specific dietary recommendations   Objective:    Today's Vitals   10/20/14 1012  BP: 122/60  Pulse: 68  Height: 5\' 2"  (1.575 m)  Weight: 156 lb 12 oz (71.101 kg)  PainSc: 0-No pain   Body mass index is 28.66 kg/(m^2).  Activities of Daily Living In your present state of health, do you have any difficulty performing the following activities: 10/20/2014 10/21/2013  Hearing? N N  Vision? N N  Difficulty concentrating or making decisions? N N  Walking or climbing stairs? N N  Dressing or bathing? N N  Doing errands, shopping? N N  Preparing Food and eating ? N -  Using the Toilet? N -  In the past six months, have you accidently leaked urine? N -  Do you have problems with loss of bowel control? N -  Managing your Medications? N -  Managing your Finances? N -  Housekeeping or managing your Housekeeping? N -    Are  there smokers in your home (other than you)? No   Cardiac Risk Factors include: advanced age (>75men, >67 women);dyslipidemia;hypertension;sedentary lifestyle  Depression Screen PHQ 2/9 Scores 10/20/2014 09/08/2014 05/28/2014 05/07/2014  PHQ - 2 Score 0 0 0 0    Fall Risk Fall Risk  10/20/2014 09/08/2014 05/28/2014 05/07/2014 02/25/2014  Falls in the past year? No No No No No    Cognitive Function: MMSE - Mini Mental State Exam 10/20/2014  Orientation to time 5  Orientation to Place 5  Registration 3  Recall 3  Language- name 2 objects 2  Language- repeat 1  Language- follow 3 step command 3  Language- read & follow direction 1  Write a sentence 1  Copy design 1     Immunizations and Health Maintenance Immunization History  Administered Date(s) Administered  . Influenza,inj,Quad PF,36+ Mos 11/02/2012, 10/29/2013  . Pneumococcal Conjugate-13 09/08/2014   Health Maintenance Due  Topic Date Due  . INFLUENZA VACCINE  09/01/2014    Patient Care Team: Chevis Pretty, [place] as PCP - General (Nurse Practitioner) Irene Shipper, MD as Consulting Physician (Gastroenterology) Truc Manus Gunning, OD as Consulting Physician (Optometry)  Indicate any recent Medical Services you may have received from other than [facility] providers in the past year (date may be approximate).    Assessment:    Annual Wellness Visit  Pre Diabetes   Screening Tests Health Maintenance  Topic Date Due  . INFLUENZA VACCINE  09/01/2014  . ZOSTAVAX  11/27/2014 (Originally 02/27/1997)  . TETANUS/TDAP  12/09/2014 (Originally 05/31/2012)  . MAMMOGRAM  12/26/2014  . PNA vac Low Risk Adult (2 of 2 - PPSV23) 09/08/2015  . DEXA SCAN  01/09/2016  . COLONOSCOPY  05/03/2022        Plan:   During the course of the visit Alexsa was educated and counseled about the following appropriate screening and preventive services:   Vaccines to include Pneumoccal, Influenza, Hepatitis B, Td, Zostavax - patient is UTD on vaccines except Zostavax and Tdap - checked price and patient declined due to cost.  Appt for influenza vaccines made.  Colorectal cancer screening - UTD  Cardiovascular disease screening - BP at goal today;  Lipids at goal.  Diabetes screening - lat FBG was elevated - checked A1c today which indicated pre diabetes.   Discussed limiting serving sizes of high CHO foods.   Encouraged increase physcial activity. Increase by 10 minutes until goal of 30 minutes at least 4 days per week.   Bone Denisty / Osteoporosis Screening - UTD  Mammogram - reminded that this is due at end of November   Glaucoma screening / Diabetic Eye Exam - patient has appt  Advanced Directives -  Information given in office today  Recommended trial of omeprazole 20mg  QOD instaead of QD.   Patient Instructions (the written plan) were given to the patient.   Cherre Robins, PHARMD, CPP< CDE  10/20/2014

## 2014-11-11 ENCOUNTER — Ambulatory Visit (INDEPENDENT_AMBULATORY_CARE_PROVIDER_SITE_OTHER): Payer: Medicare Other

## 2014-11-11 DIAGNOSIS — Z23 Encounter for immunization: Secondary | ICD-10-CM

## 2014-11-12 ENCOUNTER — Encounter: Payer: Self-pay | Admitting: *Deleted

## 2014-12-10 ENCOUNTER — Ambulatory Visit (INDEPENDENT_AMBULATORY_CARE_PROVIDER_SITE_OTHER): Payer: Medicare Other | Admitting: Nurse Practitioner

## 2014-12-10 ENCOUNTER — Encounter: Payer: Self-pay | Admitting: Nurse Practitioner

## 2014-12-10 VITALS — BP 124/58 | HR 62 | Temp 96.5°F | Ht 62.0 in | Wt 155.0 lb

## 2014-12-10 DIAGNOSIS — E038 Other specified hypothyroidism: Secondary | ICD-10-CM

## 2014-12-10 DIAGNOSIS — I10 Essential (primary) hypertension: Secondary | ICD-10-CM | POA: Diagnosis not present

## 2014-12-10 DIAGNOSIS — K219 Gastro-esophageal reflux disease without esophagitis: Secondary | ICD-10-CM

## 2014-12-10 DIAGNOSIS — E034 Atrophy of thyroid (acquired): Secondary | ICD-10-CM | POA: Diagnosis not present

## 2014-12-10 DIAGNOSIS — M8580 Other specified disorders of bone density and structure, unspecified site: Secondary | ICD-10-CM

## 2014-12-10 DIAGNOSIS — F411 Generalized anxiety disorder: Secondary | ICD-10-CM

## 2014-12-10 DIAGNOSIS — E663 Overweight: Secondary | ICD-10-CM

## 2014-12-10 DIAGNOSIS — E785 Hyperlipidemia, unspecified: Secondary | ICD-10-CM | POA: Diagnosis not present

## 2014-12-10 MED ORDER — LORAZEPAM 0.5 MG PO TABS: 0.5000 mg | ORAL_TABLET | Freq: Two times a day (BID) | ORAL | Status: DC | PRN

## 2014-12-10 MED ORDER — LORAZEPAM 0.5 MG PO TABS
0.5000 mg | ORAL_TABLET | Freq: Two times a day (BID) | ORAL | Status: DC | PRN
Start: 2014-12-10 — End: 2014-12-10

## 2014-12-10 MED ORDER — ENALAPRIL-HYDROCHLOROTHIAZIDE 10-25 MG PO TABS: 1.0000 | ORAL_TABLET | Freq: Every day | ORAL | Status: DC

## 2014-12-10 NOTE — Patient Instructions (Signed)

## 2014-12-10 NOTE — Progress Notes (Signed)
Subjective:    Patient ID: Janice Zamora, female    DOB: [birth year], 79 y.o.   MRN: [id number]   Patient here today for follow up- she is doing well without complaints.   Hypertension This is a chronic problem. The current episode started more than 1 year ago. The problem is unchanged. The problem is controlled. Pertinent negatives include no chest pain, headaches, neck pain, palpitations or shortness of breath. Risk factors for coronary artery disease include dyslipidemia and post-menopausal state. Past treatments include ACE inhibitors, diuretics and beta blockers. The current treatment provides moderate improvement. Compliance problems include exercise.  Hypertensive end-organ damage includes a thyroid problem.  Hyperlipidemia This is a chronic problem. The current episode started more than 1 year ago. The problem is controlled. Recent lipid tests were reviewed and are normal. Exacerbating diseases include hypothyroidism. She has no history of diabetes or obesity. Factors aggravating her hyperlipidemia include thiazides. Pertinent negatives include no chest pain, myalgias or shortness of breath. Current antihyperlipidemic treatment includes statins. The current treatment provides moderate improvement of lipids. Compliance problems include adherence to diet and adherence to exercise.  Risk factors for coronary artery disease include dyslipidemia, hypertension and post-menopausal.  Thyroid Problem Presents for follow-up (hypothyroidism) visit. Patient reports no constipation, diarrhea or palpitations. Fatigue: Mild fatigue after having diverticulitis and a viral stomach illness with diarrhea.  Her past medical history is significant for hyperlipidemia. There is no history of diabetes.  GAD She takes 1-2 0.5 mg lorazepam as needed per week. She states she becomes nervous and her mind starts racing unpredictably. The lorazepam works well.  GERD On omeprazole $RemoveBefor'20mg'[id number]$  daily- keeps symptoms under control.  States her symptoms come back if she forgets to take this for a day or two.      Review of Systems  Constitutional: Negative for activity change and appetite change (Decreased appetite. ). Fatigue: Mild fatigue after having diverticulitis and a viral stomach illness with diarrhea.   HENT: Negative.   Respiratory: Negative for shortness of breath.   Cardiovascular: Negative for chest pain, palpitations and leg swelling.  Gastrointestinal: Negative for abdominal pain, diarrhea, constipation, blood in stool and abdominal distention.  Genitourinary: Negative.   Musculoskeletal: Negative for myalgias, arthralgias and neck pain.  Neurological: Negative for dizziness, light-headedness and headaches.  All other systems reviewed and are negative.      Objective:   Physical Exam  Constitutional: She is oriented to person, place, and time. She appears well-developed and well-nourished.  HENT:  Nose: Nose normal.  Mouth/Throat: Oropharynx is clear and moist.  Eyes: EOM are normal.  Neck: Trachea normal, normal range of motion and full passive range of motion without pain. Neck supple. No JVD present. Carotid bruit is not present. No thyromegaly present.  Cardiovascular: Normal rate, regular rhythm, normal heart sounds and intact distal pulses.  Exam reveals no gallop and no friction rub.   No murmur heard. Pulmonary/Chest: Effort normal and breath sounds normal.  Abdominal: Soft. Bowel sounds are normal. She exhibits no distension and no mass. There is no tenderness.  Musculoskeletal: Normal range of motion.  Lymphadenopathy:    She has no cervical adenopathy.  Neurological: She is alert and oriented to person, place, and time. She has normal reflexes.  Skin: Skin is warm and dry.  Psychiatric: She has a normal mood and affect. Her behavior is normal. Judgment and thought content normal.  Vitals reviewed.  BP 124/58 mmHg  Pulse 62  Temp(Src) 96.5 F (35.8 C) (Oral)  Ht 5'  2" (1.575 m)   Wt 155 lb (70.308 kg)  BMI 28.34 kg/m2     Assessment & Plan:  1. Essential hypertension No added salt to diet - enalapril-hydrochlorothiazide (VASERETIC) 10-25 MG tablet; Take 1 tablet by mouth daily.  Dispense: 30 tablet; Refill: 5  2. Gastroesophageal reflux disease without esophagitis Avoid spicy foods, and eating 2 hours prior to bedtime  3. Hypothyroidism due to acquired atrophy of thyroid Continue with current medications - Thyroid Panel With TSH  4. Osteopenia Weight Bearing exercise  5. Hyperlipidemia Low fat diet - Lipid panel - CMP14+EGFR  6. GAD (generalized anxiety disorder) Stress management - LORazepam (ATIVAN) 0.5 MG tablet; Take 1 tablet (0.5 mg total) by mouth 2 (two) times daily as needed. for anxiety  Dispense: 30 tablet; Refill: 0  7. bmi 25- 29  Discussed diet and exercise for person with BMI >25 Will recheck weight in 3-6 months  Continue all meds Labs pending Health Maintenance reviewed Diet and exercise encouraged RTO 3 months  Mary-Margaret Hassell Done, FNP

## 2014-12-11 ENCOUNTER — Ambulatory Visit: Payer: Medicare Other | Admitting: Nurse Practitioner

## 2014-12-11 LAB — CMP14+EGFR
A/G RATIO: 1.6 (ref 1.1–2.5)
ALT: 17 IU/L (ref 0–32)
AST: 17 IU/L (ref 0–40)
Albumin: 4.2 g/dL (ref 3.5–4.8)
Alkaline Phosphatase: 67 IU/L (ref 39–117)
BUN/Creatinine Ratio: 16 (ref 11–26)
BUN: 14 mg/dL (ref 8–27)
Bilirubin Total: 0.4 mg/dL (ref 0.0–1.2)
CALCIUM: 9.8 mg/dL (ref 8.7–10.3)
CO2: 26 mmol/L (ref 18–29)
Chloride: 99 mmol/L (ref 97–106)
Creatinine, Ser: 0.88 mg/dL (ref 0.57–1.00)
GFR calc Af Amer: 73 mL/min/{1.73_m2} (ref >59)
GFR calc non Af Amer: 64 mL/min/{1.73_m2} (ref >59)
GLOBULIN, TOTAL: 2.6 g/dL (ref 1.5–4.5)
Glucose: 119 mg/dL — ABNORMAL HIGH (ref 65–99)
POTASSIUM: 4 mmol/L (ref 3.5–5.2)
Sodium: 141 mmol/L (ref 136–144)
Total Protein: 6.8 g/dL (ref 6.0–8.5)

## 2014-12-11 LAB — THYROID PANEL WITH TSH
Free Thyroxine Index: 2.8 (ref 1.2–4.9)
T3 Uptake Ratio: 25 % (ref 24–39)
T4 TOTAL: 11 ug/dL (ref 4.5–12.0)
TSH: 3.88 u[IU]/mL (ref 0.450–4.500)

## 2014-12-11 LAB — LIPID PANEL
CHOLESTEROL TOTAL: 117 mg/dL (ref 100–199)
Chol/HDL Ratio: 3.8 ratio units (ref 0.0–4.4)
HDL: 31 mg/dL — ABNORMAL LOW (ref >39)
LDL CALC: 56 mg/dL (ref 0–99)
TRIGLYCERIDES: 149 mg/dL (ref 0–149)
VLDL CHOLESTEROL CAL: 30 mg/dL (ref 5–40)

## 2014-12-15 ENCOUNTER — Other Ambulatory Visit: Payer: Self-pay

## 2014-12-15 DIAGNOSIS — Z1231 Encounter for screening mammogram for malignant neoplasm of breast: Secondary | ICD-10-CM

## 2015-01-22 ENCOUNTER — Ambulatory Visit
Admission: RE | Admit: 2015-01-22 | Discharge: 2015-01-22 | Disposition: A | Discharge status: Home / Self Care | Payer: Medicare Other | Source: Ambulatory Visit

## 2015-01-22 DIAGNOSIS — Z1231 Encounter for screening mammogram for malignant neoplasm of breast: Secondary | ICD-10-CM

## 2015-03-12 ENCOUNTER — Encounter: Payer: Self-pay | Admitting: Nurse Practitioner

## 2015-03-12 ENCOUNTER — Ambulatory Visit (INDEPENDENT_AMBULATORY_CARE_PROVIDER_SITE_OTHER): Payer: Medicare Other | Admitting: Nurse Practitioner

## 2015-03-12 VITALS — BP 119/61 | HR 59 | Temp 97.0°F | Ht 62.0 in | Wt 159.0 lb

## 2015-03-12 DIAGNOSIS — K219 Gastro-esophageal reflux disease without esophagitis: Secondary | ICD-10-CM | POA: Diagnosis not present

## 2015-03-12 DIAGNOSIS — E8881 Metabolic syndrome: Secondary | ICD-10-CM | POA: Diagnosis not present

## 2015-03-12 DIAGNOSIS — E785 Hyperlipidemia, unspecified: Secondary | ICD-10-CM | POA: Diagnosis not present

## 2015-03-12 DIAGNOSIS — I10 Essential (primary) hypertension: Secondary | ICD-10-CM | POA: Diagnosis not present

## 2015-03-12 DIAGNOSIS — E038 Other specified hypothyroidism: Secondary | ICD-10-CM

## 2015-03-12 DIAGNOSIS — Z1212 Encounter for screening for malignant neoplasm of rectum: Secondary | ICD-10-CM | POA: Diagnosis not present

## 2015-03-12 DIAGNOSIS — E663 Overweight: Secondary | ICD-10-CM

## 2015-03-12 DIAGNOSIS — M8580 Other specified disorders of bone density and structure, unspecified site: Secondary | ICD-10-CM

## 2015-03-12 DIAGNOSIS — F411 Generalized anxiety disorder: Secondary | ICD-10-CM

## 2015-03-12 DIAGNOSIS — E034 Atrophy of thyroid (acquired): Secondary | ICD-10-CM | POA: Diagnosis not present

## 2015-03-12 MED ORDER — ATENOLOL 50 MG PO TABS: 50.0000 mg | ORAL_TABLET | Freq: Every day | ORAL | Status: DC

## 2015-03-12 MED ORDER — OMEPRAZOLE 20 MG PO CPDR: 20.0000 mg | DELAYED_RELEASE_CAPSULE | Freq: Every day | ORAL | Status: DC

## 2015-03-12 MED ORDER — SIMVASTATIN 40 MG PO TABS: 40.0000 mg | ORAL_TABLET | Freq: Every day | ORAL | Status: DC

## 2015-03-12 MED ORDER — LEVOTHYROXINE SODIUM 75 MCG PO TABS: 75.0000 ug | ORAL_TABLET | Freq: Every day | ORAL | Status: DC

## 2015-03-12 MED ORDER — LORAZEPAM 0.5 MG PO TABS: 0.5000 mg | ORAL_TABLET | Freq: Two times a day (BID) | ORAL | Status: DC | PRN

## 2015-03-12 NOTE — Patient Instructions (Signed)
Bone Health Bones protect organs, store calcium, and anchor muscles. Good health habits, such as eating nutritious foods and exercising regularly, are important for maintaining healthy bones. They can also help to prevent a condition that causes bones to lose density and become weak and brittle (osteoporosis). WHY IS BONE MASS IMPORTANT? Bone mass refers to the amount of bone tissue that you have. The higher your bone mass, the stronger your bones. An important step toward having healthy bones throughout life is to have strong and dense bones during childhood. A young adult who has a high bone mass is more likely to have a high bone mass later in life. Bone mass at its greatest it is called peak bone mass. A large decline in bone mass occurs in older adults. In women, it occurs about the time of menopause. During this time, it is important to practice good health habits, because if more bone is lost than what is replaced, the bones will become less healthy and more likely to break (fracture). If you find that you have a low bone mass, you may be able to prevent osteoporosis or further bone loss by changing your diet and lifestyle. HOW CAN I FIND OUT IF MY BONE MASS IS LOW? Bone mass can be measured with an X-ray test that is called a bone mineral density (BMD) test. This test is recommended for all women who are age 65 or older. It may also be recommended for men who are age 70 or older, or for people who are more likely to develop osteoporosis due to:  Having bones that break easily.  Having a long-term disease that weakens bones, such as kidney disease or rheumatoid arthritis.  Having menopause earlier than normal.  Taking medicine that weakens bones, such as steroids, thyroid hormones, or hormone treatment for breast cancer or prostate cancer.  Smoking.  Drinking three or more alcoholic drinks each day. WHAT ARE THE NUTRITIONAL RECOMMENDATIONS FOR HEALTHY BONES? To have healthy bones, you need  to get enough of the right minerals and vitamins. Most nutrition experts recommend getting these nutrients from the foods that you eat. Nutritional recommendations vary from person to person. Ask your health care provider what is healthy for you. Here are some general guidelines. Calcium Recommendations Calcium is the most important (essential) mineral for bone health. Most people can get enough calcium from their diet, but supplements may be recommended for people who are at risk for osteoporosis. Good sources of calcium include:  Dairy products, such as low-fat or nonfat milk, cheese, and yogurt.  Dark green leafy vegetables, such as bok choy and broccoli.  Calcium-fortified foods, such as orange juice, cereal, bread, soy beverages, and tofu products.  Nuts, such as almonds. Follow these recommended amounts for daily calcium intake:  Children, age 1-3: 700 mg.  Children, age 4-8: 1,000 mg.  Children, age 9-13: 1,300 mg.  Teens, age 14-18: 1,300 mg.  Adults, age 19-50: 1,000 mg.  Adults, age 51-70:  Men: 1,000 mg.  Women: 1,200 mg.  Adults, age 71 or older: 1,200 mg.  Pregnant and breastfeeding females:  Teens: 1,300 mg.  Adults: 1,000 mg. Vitamin D Recommendations Vitamin D is the most essential vitamin for bone health. It helps the body to absorb calcium. Sunlight stimulates the skin to make vitamin D, so be sure to get enough sunlight. If you live in a cold climate or you do not get outside often, your health care provider may recommend that you take vitamin D supplements. Good   sources of vitamin D in your diet include:  Egg yolks.  Saltwater fish.  Milk and cereal fortified with vitamin D. Follow these recommended amounts for daily vitamin D intake:  Children and teens, age 1-18: 600 international units.  Adults, age 50 or younger: 400-800 international units.  Adults, age 51 or older: 800-1,000 international units. Other Nutrients Other nutrients for bone  health include:  Phosphorus. This mineral is found in meat, poultry, dairy foods, nuts, and legumes. The recommended daily intake for adult men and adult women is 700 mg.  Magnesium. This mineral is found in seeds, nuts, dark green vegetables, and legumes. The recommended daily intake for adult men is 400-420 mg. For adult women, it is 310-320 mg.  Vitamin K. This vitamin is found in green leafy vegetables. The recommended daily intake is 120 mg for adult men and 90 mg for adult women. WHAT TYPE OF PHYSICAL ACTIVITY IS BEST FOR BUILDING AND MAINTAINING HEALTHY BONES? Weight-bearing and strength-building activities are important for building and maintaining peak bone mass. Weight-bearing activities cause muscles and bones to work against gravity. Strength-building activities increases muscle strength that supports bones. Weight-bearing and muscle-building activities include:  Walking and hiking.  Jogging and running.  Dancing.  Gym exercises.  Lifting weights.  Tennis and racquetball.  Climbing stairs.  Aerobics. Adults should get at least 30 minutes of moderate physical activity on most days. Children should get at least 60 minutes of moderate physical activity on most days. Ask your health care provide what type of exercise is best for you. WHERE CAN I FIND MORE INFORMATION? For more information, check out the following websites:  National Osteoporosis Foundation: http://nof.org/learn/basics  National Institutes of Health: http://www.niams.nih.gov/Health_Info/Bone/Bone_Health/bone_health_for_life.asp   This information is not intended to replace advice given to you by your health care provider. Make sure you discuss any questions you have with your health care provider.   Document Released: 04/09/2003 Document Revised: 06/03/2014 Document Reviewed: 01/22/2014 Elsevier Interactive Patient Education 2016 [employer] Inc.  

## 2015-03-12 NOTE — Progress Notes (Signed)
Subjective:    Patient ID: Janice Zamora, female    DOB: [birth year], 80 y.o.   MRN: [id number]    Patient here today for follow up of chronic medical problems.  Outpatient Encounter Prescriptions as of 03/12/2015  Medication Sig  . atenolol (TENORMIN) 50 MG tablet Take 1 tablet (50 mg total) by mouth daily.  . enalapril-hydrochlorothiazide (VASERETIC) 10-25 MG tablet Take 1 tablet by mouth daily.  Marland Kitchen levothyroxine (SYNTHROID) 75 MCG tablet Take 1 tablet (75 mcg total) by mouth daily before breakfast.  . LORazepam (ATIVAN) 0.5 MG tablet Take 1 tablet (0.5 mg total) by mouth 2 (two) times daily as needed. for anxiety  . Multiple Vitamin (MULTIVITAMIN WITH MINERALS) TABS tablet Take 1 tablet by mouth daily.  Marland Kitchen omeprazole (PRILOSEC) 20 MG capsule Take 1 capsule (20 mg total) by mouth daily.  . simvastatin (ZOCOR) 40 MG tablet Take 1 tablet (40 mg total) by mouth at bedtime.   No facility-administered encounter medications on file as of 03/12/2015.     Hypertension This is a chronic problem. The current episode started more than 1 year ago. The problem is unchanged. The problem is controlled. Pertinent negatives include no chest pain, headaches, neck pain, palpitations or shortness of breath. Risk factors for coronary artery disease include dyslipidemia and post-menopausal state. Past treatments include ACE inhibitors, diuretics and beta blockers. The current treatment provides moderate improvement. Compliance problems include exercise.  Hypertensive end-organ damage includes a thyroid problem.  Hyperlipidemia This is a chronic problem. The current episode started more than 1 year ago. The problem is controlled. Recent lipid tests were reviewed and are normal. Exacerbating diseases include hypothyroidism. She has no history of diabetes or obesity. Factors aggravating her hyperlipidemia include thiazides. Pertinent negatives include no chest pain, myalgias or shortness of breath. Current  antihyperlipidemic treatment includes statins. The current treatment provides moderate improvement of lipids. Compliance problems include adherence to diet and adherence to exercise.  Risk factors for coronary artery disease include dyslipidemia, hypertension and post-menopausal.  Thyroid Problem Presents for follow-up (hypothyroidism) visit. Patient reports no constipation, diarrhea or palpitations. Fatigue: Mild fatigue after having diverticulitis and a viral stomach illness with diarrhea.  Her past medical history is significant for hyperlipidemia. There is no history of diabetes.  GAD She takes 1-2 0.5 mg lorazepam as needed per week. She states she becomes nervous and her mind starts racing unpredictably. The lorazepam works well.  GERD On omeprazole 105m daily- keeps symptoms under control. States her symptoms come back if she forgets to take this for a day or two.    Review of Systems  Constitutional: Negative for activity change and appetite change (Decreased appetite. ). Fatigue: Mild fatigue after having diverticulitis and a viral stomach illness with diarrhea.   HENT: Negative.   Respiratory: Negative for shortness of breath.   Cardiovascular: Negative for chest pain, palpitations and leg swelling.  Gastrointestinal: Negative for abdominal pain, diarrhea, constipation, blood in stool and abdominal distention.  Genitourinary: Negative.   Musculoskeletal: Negative for myalgias, arthralgias and neck pain.  Neurological: Negative for dizziness, light-headedness and headaches.  All other systems reviewed and are negative.      Objective:   Physical Exam  Constitutional: She is oriented to person, place, and time. She appears well-developed and well-nourished.  HENT:  Nose: Nose normal.  Mouth/Throat: Oropharynx is clear and moist.  Eyes: EOM are normal.  Neck: Trachea normal, normal range of motion and full passive range of motion without pain. Neck supple. No JVD  present. Carotid  bruit is not present. No thyromegaly present.  Cardiovascular: Normal rate, regular rhythm, normal heart sounds and intact distal pulses.  Exam reveals no gallop and no friction rub.   No murmur heard. Pulmonary/Chest: Effort normal and breath sounds normal.  Abdominal: Soft. Bowel sounds are normal. She exhibits no distension and no mass. There is no tenderness.  Musculoskeletal: Normal range of motion.  Lymphadenopathy:    She has no cervical adenopathy.  Neurological: She is alert and oriented to person, place, and time. She has normal reflexes.  Skin: Skin is warm and dry.  Psychiatric: She has a normal mood and affect. Her behavior is normal. Judgment and thought content normal.  Vitals reviewed.  BP 119/61 mmHg  Pulse 59  Temp(Src) 97 F (36.1 C) (Oral)  Ht 5' 2" (1.575 m)  Wt 159 lb (72.122 kg)  BMI 29.07 kg/m2     Assessment & Plan:  1. Essential hypertension Do not add salt to diet - CMP14+EGFR - atenolol (TENORMIN) 50 MG tablet; Take 1 tablet (50 mg total) by mouth daily.  Dispense: 30 tablet; Refill: 5  2. Gastroesophageal reflux disease without esophagitis Avoid spicy foods Do not eat 2 hours prior to bedtime - omeprazole (PRILOSEC) 20 MG capsule; Take 1 capsule (20 mg total) by mouth daily.  Dispense: 30 capsule; Refill: 5  3. Hypothyroidism due to acquired atrophy of thyroid - levothyroxine (SYNTHROID) 75 MCG tablet; Take 1 tablet (75 mcg total) by mouth daily before breakfast.  Dispense: 30 tablet; Refill: 5  4. Osteopenia Weight bearing exercises  5. Hyperlipidemia Low fta diet - Lipid panel - simvastatin (ZOCOR) 40 MG tablet; Take 1 tablet (40 mg total) by mouth at bedtime.  Dispense: 30 tablet; Refill: 5  6. GAD (generalized anxiety disorder) Stress management - LORazepam (ATIVAN) 0.5 MG tablet; Take 1 tablet (0.5 mg total) by mouth 2 (two) times daily as needed. for anxiety  Dispense: 30 tablet; Refill: 1  7. Overweight (BMI 25.0-29.9) Discussed  diet and exercise for person with BMI >25 Will recheck weight in 3-6 months  8. Metabolic syndrome Watch carbs in diet  9. Screening for malignant neoplasm of the rectum - Fecal occult blood, imunochemical; Future    Labs pending Health maintenance reviewed Diet and exercise encouraged Continue all meds Follow up  In 3 months   [place], FNP

## 2015-03-13 LAB — CMP14+EGFR
A/G RATIO: 1.7 (ref 1.1–2.5)
ALBUMIN: 4.3 g/dL (ref 3.5–4.8)
ALK PHOS: 64 IU/L (ref 39–117)
ALT: 16 IU/L (ref 0–32)
AST: 20 IU/L (ref 0–40)
BUN / CREAT RATIO: 22 (ref 11–26)
BUN: 17 mg/dL (ref 8–27)
Bilirubin Total: 0.3 mg/dL (ref 0.0–1.2)
CO2: 23 mmol/L (ref 18–29)
Calcium: 9.8 mg/dL (ref 8.7–10.3)
Chloride: 98 mmol/L (ref 96–106)
Creatinine, Ser: 0.78 mg/dL (ref 0.57–1.00)
GFR calc Af Amer: 84 mL/min/{1.73_m2} (ref >59)
GFR, EST NON AFRICAN AMERICAN: 73 mL/min/{1.73_m2} (ref >59)
GLOBULIN, TOTAL: 2.5 g/dL (ref 1.5–4.5)
Glucose: 127 mg/dL — ABNORMAL HIGH (ref 65–99)
POTASSIUM: 3.8 mmol/L (ref 3.5–5.2)
SODIUM: 142 mmol/L (ref 134–144)
Total Protein: 6.8 g/dL (ref 6.0–8.5)

## 2015-03-13 LAB — LIPID PANEL
CHOL/HDL RATIO: 3.3 ratio (ref 0.0–4.4)
CHOLESTEROL TOTAL: 148 mg/dL (ref 100–199)
HDL: 45 mg/dL (ref >39)
LDL CALC: 83 mg/dL (ref 0–99)
TRIGLYCERIDES: 100 mg/dL (ref 0–149)
VLDL Cholesterol Cal: 20 mg/dL (ref 5–40)

## 2015-03-16 ENCOUNTER — Encounter: Payer: Self-pay | Admitting: *Deleted

## 2015-05-05 ENCOUNTER — Ambulatory Visit (INDEPENDENT_AMBULATORY_CARE_PROVIDER_SITE_OTHER): Payer: Medicare Other | Admitting: Nurse Practitioner

## 2015-05-05 ENCOUNTER — Encounter: Payer: Self-pay | Admitting: Nurse Practitioner

## 2015-05-05 VITALS — BP 143/67 | HR 92 | Temp 97.2°F | Ht 62.0 in | Wt 162.0 lb

## 2015-05-05 DIAGNOSIS — J209 Acute bronchitis, unspecified: Secondary | ICD-10-CM

## 2015-05-05 MED ORDER — BENZONATATE 100 MG PO CAPS: 100.0000 mg | ORAL_CAPSULE | Freq: Three times a day (TID) | ORAL | Status: DC | PRN

## 2015-05-05 NOTE — Patient Instructions (Signed)

## 2015-05-05 NOTE — Progress Notes (Signed)
  Subjective:     Janice Zamora is a 80 y.o. female here for evaluation of a cough. Onset of symptoms was 1 week ago. Symptoms have been gradually worsening since that time. The cough is barky, dry and hoarse and is aggravated by  laying down. Associated symptoms include: nothing. Patient does not have a history of asthma. Patient does not have a history of environmental allergens. Patient has not traveled recently. Patient does not have a history of smoking. Patient has had a previous chest x-ray. Patient has not had a PPD done.  The following portions of the patient's history were reviewed and updated as appropriate: allergies, current medications, past family history, past medical history, past social history, past surgical history and problem list.  Review of Systems Pertinent items are noted in HPI.    Objective:     BP 143/67 mmHg  Pulse 92  Temp(Src) 97.2 F (36.2 C) (Oral)  Ht 5\' 2"  (1.575 m)  Wt 162 lb (73.483 kg)  BMI 29.62 kg/m2 General appearance: alert, cooperative and no distress Eyes: conjunctivae/corneas clear. PERRL, EOM's intact. Fundi benign. Ears: normal TM's and external ear canals both ears Nose: Nares normal. Septum midline. Mucosa normal. No drainage or sinus tenderness., mild congestion Throat: lips, mucosa, and tongue normal; teeth and gums normal Neck: no adenopathy, no carotid bruit, no JVD, supple, symmetrical, trachea midline and thyroid not enlarged, symmetric, no tenderness/mass/nodules Lungs: clear to auscultation bilaterally and dry cough Heart: regular rate and rhythm, S1, S2 normal, no murmur, click, rub or gallop    Assessment:    Acute Bronchitis    Plan:  1. Take meds as prescribed 2. Use a cool mist humidifier especially during the winter months and when heat has been humid. 3. Use saline nose sprays frequently 4. Saline irrigations of the nose can be very helpful if done frequently.  * 4X daily for 1 week*  * Use of a nettie pot can be  helpful with this. Follow directions with this* 5. Drink plenty of fluids 6. Keep thermostat turn down low 7.For any cough or congestion  Use plain Mucinex- regular strength or max strength is fine   * Children- consult with Pharmacist for dosing 8. For fever or aces or pains- take tylenol or ibuprofen appropriate for age and weight.  * for fevers greater than 101 orally you may alternate ibuprofen and tylenol every  3 hours.   Meds ordered this encounter  Medications  . benzonatate (TESSALON) 100 MG capsule    Sig: Take 1 capsule (100 mg total) by mouth 3 (three) times daily as needed for cough.    Dispense:  20 capsule    Refill:  0    Order Specific Question:  Supervising Provider    Answer:  Chipper Herb [place], FNP

## 2015-05-13 ENCOUNTER — Other Ambulatory Visit: Payer: Self-pay | Admitting: Nurse Practitioner

## 2015-05-14 NOTE — Telephone Encounter (Signed)
Pt notified of MMM recommendation Verbalizes understanding  

## 2015-05-14 NOTE — Telephone Encounter (Signed)
coough may last several weeks- can take delsym or mucinex OTC - if  No fever antibiotiic will not help

## 2015-06-24 ENCOUNTER — Other Ambulatory Visit: Payer: Self-pay | Admitting: Nurse Practitioner

## 2015-09-10 ENCOUNTER — Ambulatory Visit: Payer: Medicare Other | Admitting: Nurse Practitioner

## 2015-09-14 ENCOUNTER — Encounter: Payer: Self-pay | Admitting: Nurse Practitioner

## 2015-09-14 ENCOUNTER — Ambulatory Visit (INDEPENDENT_AMBULATORY_CARE_PROVIDER_SITE_OTHER): Payer: Medicare Other | Admitting: Nurse Practitioner

## 2015-09-14 VITALS — BP 138/70 | HR 68 | Temp 96.7°F | Ht 62.0 in | Wt 155.0 lb

## 2015-09-14 DIAGNOSIS — M545 Low back pain, unspecified: Secondary | ICD-10-CM

## 2015-09-14 DIAGNOSIS — E663 Overweight: Secondary | ICD-10-CM | POA: Diagnosis not present

## 2015-09-14 DIAGNOSIS — E8881 Metabolic syndrome: Secondary | ICD-10-CM

## 2015-09-14 DIAGNOSIS — I10 Essential (primary) hypertension: Secondary | ICD-10-CM | POA: Diagnosis not present

## 2015-09-14 DIAGNOSIS — K219 Gastro-esophageal reflux disease without esophagitis: Secondary | ICD-10-CM | POA: Diagnosis not present

## 2015-09-14 DIAGNOSIS — E785 Hyperlipidemia, unspecified: Secondary | ICD-10-CM

## 2015-09-14 DIAGNOSIS — E034 Atrophy of thyroid (acquired): Secondary | ICD-10-CM

## 2015-09-14 DIAGNOSIS — Z23 Encounter for immunization: Secondary | ICD-10-CM

## 2015-09-14 DIAGNOSIS — E038 Other specified hypothyroidism: Secondary | ICD-10-CM

## 2015-09-14 DIAGNOSIS — F411 Generalized anxiety disorder: Secondary | ICD-10-CM

## 2015-09-14 LAB — LIPID PANEL
CHOL/HDL RATIO: 3.4 ratio (ref 0.0–4.4)
Cholesterol, Total: 143 mg/dL (ref 100–199)
HDL: 42 mg/dL (ref >39)
LDL Calculated: 70 mg/dL (ref 0–99)
Triglycerides: 153 mg/dL — ABNORMAL HIGH (ref 0–149)
VLDL Cholesterol Cal: 31 mg/dL (ref 5–40)

## 2015-09-14 LAB — CMP14+EGFR
A/G RATIO: 1.4 (ref 1.2–2.2)
ALT: 10 IU/L (ref 0–32)
AST: 12 IU/L (ref 0–40)
Albumin: 4.3 g/dL (ref 3.5–4.8)
Alkaline Phosphatase: 81 IU/L (ref 39–117)
BUN/Creatinine Ratio: 21 (ref 12–28)
BUN: 19 mg/dL (ref 8–27)
Bilirubin Total: 0.3 mg/dL (ref 0.0–1.2)
CALCIUM: 10 mg/dL (ref 8.7–10.3)
CO2: 23 mmol/L (ref 18–29)
Chloride: 95 mmol/L — ABNORMAL LOW (ref 96–106)
Creatinine, Ser: 0.92 mg/dL (ref 0.57–1.00)
GFR calc non Af Amer: 60 mL/min/{1.73_m2} (ref >59)
GFR, EST AFRICAN AMERICAN: 69 mL/min/{1.73_m2} (ref >59)
GLOBULIN, TOTAL: 3 g/dL (ref 1.5–4.5)
Glucose: 116 mg/dL — ABNORMAL HIGH (ref 65–99)
POTASSIUM: 4.4 mmol/L (ref 3.5–5.2)
SODIUM: 139 mmol/L (ref 134–144)
TOTAL PROTEIN: 7.3 g/dL (ref 6.0–8.5)

## 2015-09-14 LAB — BAYER DCA HB A1C WAIVED: HB A1C: 6.5 % (ref <7.0)

## 2015-09-14 MED ORDER — ENALAPRIL-HYDROCHLOROTHIAZIDE 10-25 MG PO TABS: 1.0000 | ORAL_TABLET | Freq: Every day | ORAL | 5 refills | Status: DC

## 2015-09-14 MED ORDER — OMEPRAZOLE 20 MG PO CPDR: 20.0000 mg | DELAYED_RELEASE_CAPSULE | Freq: Every day | ORAL | 5 refills | Status: DC

## 2015-09-14 MED ORDER — MELOXICAM 15 MG PO TABS: 15.0000 mg | ORAL_TABLET | Freq: Every day | ORAL | 3 refills | Status: DC

## 2015-09-14 MED ORDER — SIMVASTATIN 40 MG PO TABS: 40.0000 mg | ORAL_TABLET | Freq: Every day | ORAL | 5 refills | Status: DC

## 2015-09-14 MED ORDER — LEVOTHYROXINE SODIUM 75 MCG PO TABS: 75.0000 ug | ORAL_TABLET | Freq: Every day | ORAL | 5 refills | Status: DC

## 2015-09-14 MED ORDER — ATENOLOL 50 MG PO TABS: 50.0000 mg | ORAL_TABLET | Freq: Every day | ORAL | 5 refills | Status: DC

## 2015-09-14 MED ORDER — LORAZEPAM 0.5 MG PO TABS: 0.5000 mg | ORAL_TABLET | Freq: Two times a day (BID) | ORAL | 1 refills | Status: DC | PRN

## 2015-09-14 NOTE — Addendum Note (Signed)
Addended by: Rolena Infante on: 09/14/2015 11:23 AM   Modules accepted: Orders

## 2015-09-14 NOTE — Progress Notes (Signed)
Subjective:    Patient ID: Janice Zamora, female    DOB: [birth year], 80 y.o.   MRN: [id number]    Patient here today for follow up of chronic medical problems.  Outpatient Encounter Prescriptions as of 09/14/2015  Medication Sig  . atenolol (TENORMIN) 50 MG tablet Take 1 tablet (50 mg total) by mouth daily.  . enalapril-hydrochlorothiazide (VASERETIC) 10-25 MG tablet TAKE ONE TABLET BY MOUTH ONCE DAILY  . levothyroxine (SYNTHROID) 75 MCG tablet Take 1 tablet (75 mcg total) by mouth daily before breakfast.  . LORazepam (ATIVAN) 0.5 MG tablet Take 1 tablet (0.5 mg total) by mouth 2 (two) times daily as needed. for anxiety  . Multiple Vitamin (MULTIVITAMIN WITH MINERALS) TABS tablet Take 1 tablet by mouth daily.  Marland Kitchen omeprazole (PRILOSEC) 20 MG capsule Take 1 capsule (20 mg total) by mouth daily.  . simvastatin (ZOCOR) 40 MG tablet Take 1 tablet (40 mg total) by mouth at bedtime.   * Patient was mopping floors a week ago and when she was done she noticed her back was stiffed. Is still bother ing her. Pain does not radiate down leg. Just stays  In midback.  Hypertension  This is a chronic problem. The current episode started more than 1 year ago. The problem is unchanged. The problem is controlled. Pertinent negatives include no chest pain, headaches, neck pain, palpitations or shortness of breath. Risk factors for coronary artery disease include dyslipidemia and post-menopausal state. Past treatments include ACE inhibitors, diuretics and beta blockers. The current treatment provides moderate improvement. Compliance problems include exercise.  Hypertensive end-organ damage includes a thyroid problem.  Hyperlipidemia  This is a chronic problem. The current episode started more than 1 year ago. The problem is controlled. Recent lipid tests were reviewed and are normal. Exacerbating diseases include hypothyroidism. She has no history of diabetes or obesity. Factors aggravating her hyperlipidemia  include thiazides. Pertinent negatives include no chest pain, myalgias or shortness of breath. Current antihyperlipidemic treatment includes statins. The current treatment provides moderate improvement of lipids. Compliance problems include adherence to diet and adherence to exercise.  Risk factors for coronary artery disease include dyslipidemia, hypertension and post-menopausal.  Thyroid Problem  Presents for follow-up (hypothyroidism) visit. Patient reports no constipation, diarrhea, fatigue or palpitations. Her past medical history is significant for hyperlipidemia. There is no history of diabetes.  GAD She takes 1-2 0.5 mg lorazepam as needed per week. She states she becomes nervous and her mind starts racing unpredictably. The lorazepam works well.  GERD On omeprazole '20mg'$  daily- keeps symptoms under control. States her symptoms come back if she forgets to take this for a day or two.  Metabolic syndrome Patient does not watch carbs in diet- last HGBA1 c was 6.3%- will recheck today     Review of Systems  Constitutional: Negative for appetite change and fatigue.  HENT: Negative.   Respiratory: Negative for shortness of breath.   Cardiovascular: Negative for chest pain, palpitations and leg swelling.  Gastrointestinal: Negative for abdominal distention, abdominal pain, blood in stool, constipation and diarrhea.  Genitourinary: Negative.   Musculoskeletal: Negative for arthralgias, myalgias and neck pain.  Neurological: Negative for dizziness, light-headedness and headaches.  All other systems reviewed and are negative.      Objective:   Physical Exam  Constitutional: She is oriented to person, place, and time. She appears well-developed and well-nourished.  HENT:  Nose: Nose normal.  Mouth/Throat: Oropharynx is clear and moist.  Eyes: EOM are normal.  Neck: Trachea normal, normal  range of motion and full passive range of motion without pain. Neck supple. No JVD present. Carotid  bruit is not present. No thyromegaly present.  Cardiovascular: Normal rate, regular rhythm, normal heart sounds and intact distal pulses.  Exam reveals no gallop and no friction rub.   No murmur heard. Pulmonary/Chest: Effort normal and breath sounds normal.  Abdominal: Soft. Bowel sounds are normal. She exhibits no distension and no mass. There is no tenderness.  Musculoskeletal: Normal range of motion.  Lymphadenopathy:    She has no cervical adenopathy.  Neurological: She is alert and oriented to person, place, and time. She has normal reflexes.  Skin: Skin is warm and dry.  Psychiatric: She has a normal mood and affect. Her behavior is normal. Judgment and thought content normal.  Vitals reviewed.  BP 138/70   Pulse 68   Temp (!) 96.7 F (35.9 C) (Oral)   Ht '5\' 2"'$  (1.575 m)   Wt 155 lb (70.3 kg)   BMI 28.35 kg/m      Assessment & Plan:  1. Essential hypertension Do not add salt to diet - CMP14+EGFR - atenolol (TENORMIN) 50 MG tablet; Take 1 tablet (50 mg total) by mouth daily.  Dispense: 30 tablet; Refill: 5  2. Gastroesophageal reflux disease without esophagitis Avoid spicy foods Do not eat 2 hours prior to bedtime - omeprazole (PRILOSEC) 20 MG capsule; Take 1 capsule (20 mg total) by mouth daily.  Dispense: 30 capsule; Refill: 5  3. Hypothyroidism due to acquired atrophy of thyroid - levothyroxine (SYNTHROID) 75 MCG tablet; Take 1 tablet (75 mcg total) by mouth daily before breakfast.  Dispense: 30 tablet; Refill: 5  4. Hyperlipidemia Low fta diet - Lipid panel - simvastatin (ZOCOR) 40 MG tablet; Take 1 tablet (40 mg total) by mouth at bedtime.  Dispense: 30 tablet; Refill: 5  5. GAD (generalized anxiety disorder) Stress management - LORazepam (ATIVAN) 0.5 MG tablet; Take 1 tablet (0.5 mg total) by mouth 2 (two) times daily as needed. for anxiety  Dispense: 30 tablet; Refill: 1  6. Overweight (BMI 25.0-29.9) Discussed diet and exercise for person with BMI  >25 Will recheck weight in 3-6 months  7. Metabolic syndrome Watch carbs in diet - Bayer DCA Hb A1c Waived  8. Midline low back pain without sciatica Moist heat to back No strenuous activity - meloxicam (MOBIC) 15 MG tablet; Take 1 tablet (15 mg total) by mouth daily.  Dispense: 30 tablet; Refill: 3   TDAP today Labs pending Health maintenance reviewed Diet and exercise encouraged Continue all meds Follow up  In 3 months   [place], FNP

## 2015-09-14 NOTE — Patient Instructions (Signed)
, Adult Pertussis (whooping cough) is an infection that causes severe and sudden coughing attacks. CAUSES  Pertussis is caused by bacteria. It is very contagious and spreads to others by the droplets sprayed in the air when an infected person talks, coughs, and sneezes. You may have caught pertussis from inhaling these droplets or from touching a surface where the droplets fell and then touching your mouth or nose. SIGNS AND SYMPTOMS  Early during this infection, symptoms of pertussis are similar to those of the common cold. They include a runny nose, low fever, mild cough, and red, watery eyes. After 1-2 weeks the cold symptoms get better, but the cough worsens and severe and sudden coughing attacks frequently develop. During these attacks people may cough so hard that vomiting occurs. Over the next month to 6 weeks, the cough starts to get better, but it may take as long as 6 months for the cough to go away completely. DIAGNOSIS  Your health care provider will perform a physical exam. The health care provider may take a mucus sample from the nose and throat and a blood sample to help confirm the diagnosis. The health care provider may also take a chest X-ray. TREATMENT  Antibiotic medicines are usually prescribed for this infection. Starting antibiotics quickly may help shorten the illness and make it less contagious. Antibiotics may also be prescribed for everyone living in the same household. Immunization may be recommended for those in the household at risk of developing pertussis. At-risk groups include:  Infants.  Those who have not had their full course of pertussis immunizations.  Those who were immunized but have not had their recent booster shot. Mild coughing may continue for months after the infection is treated from the remaining soreness and inflammation in the lungs. HOME CARE INSTRUCTIONS   If you were prescribed an antibiotic medicine, finish it all even if you start to feel  better.  Do not take cough medicine unless prescribed by your health care provider. Coughing is a protective mechanism that helps keep colored mucus (sputum) and secretions from clogging breathing passages.  Stay away from those who are at risk of developing pertussis for the first 5 days of antibiotic treatment. If no antibiotics are prescribed, stay at home for the first 3 weeks you are coughing.  Do not go to work until you have been treated with antibiotics for 5 days. If no antibiotics are prescribed, do not go to work for the first 3 weeks you are coughing. Inform your workplace that you were diagnosed with pertussis.  Wash your hands often. Those living in the same household should also wash their hands often to avoid spreading the infection.  Avoid substances that may irritate the lungs, such as smoke, aerosols, or fumes. These substances may worsen your coughing.  If you are having a coughing attack:  Raise the head of your mattress to help clear sputum more easily and improve breathing.  Sit upright.  Use a cool mist humidifier at home to increase air moisture. This will soothe your cough and help loosen sputum. Do not use hot steam.  Rest as much as possible. Normal activity may be gradually resumed.  Drink enough fluids to keep your urine clear or pale yellow. PREVENTION  Pertussis can be prevented with a vaccine and later booster shots. The pertussis vaccine is usually given during childhood. Adults who were not previously vaccinated should be vaccinated as soon as possible. Adults who were previously vaccinated should talk to their health   care providers about the need for a booster shot because immunity from the vaccine decreases over time. All of the following persons should consider receiving a booster dose of pertussis, which is combined with tetanus and diphtheria (Tdap) vaccine:  Pregnant women during each pregnancy, preferably at 27-36 weeks of pregnancy  (gestation).  All persons who have or will have close contact with an infant aged less than 12 months. Infants are at highest risk for life-threatening complications from pertussis.  All health care personnel. SEEK MEDICAL CARE IF:   You have persistent vomiting.  You are not able to eat or drink fluids.  You do not seem to be improving.  You have a fever. SEEK IMMEDIATE MEDICAL CARE IF:   Your face turns red or blue during a coughing attack.  You become unconscious after a coughing attack, even if only for a few moments.  Your breathing stops for a period of time (apnea).  You are restless or cannot sleep.  You are listless or sleeping too much. MAKE SURE YOU:  Understand these instructions.   Will watch your condition.   Will get help right away if you are not doing well or get worse.   This information is not intended to replace advice given to you by your health care provider. Make sure you discuss any questions you have with your health care provider.   Document Released: 05/14/2012 Document Revised: 02/07/2014 Document Reviewed: 05/14/2012 Elsevier Interactive Patient Education [employer].

## 2015-10-22 ENCOUNTER — Ambulatory Visit (INDEPENDENT_AMBULATORY_CARE_PROVIDER_SITE_OTHER): Payer: Medicare Other

## 2015-10-22 DIAGNOSIS — Z23 Encounter for immunization: Secondary | ICD-10-CM | POA: Diagnosis not present

## 2015-10-28 ENCOUNTER — Ambulatory Visit (INDEPENDENT_AMBULATORY_CARE_PROVIDER_SITE_OTHER): Payer: Medicare Other | Admitting: *Deleted

## 2015-10-28 VITALS — BP 119/64 | HR 73 | Ht 61.25 in | Wt 149.0 lb

## 2015-10-28 DIAGNOSIS — Z0000 Encounter for general adult medical examination without abnormal findings: Secondary | ICD-10-CM | POA: Diagnosis not present

## 2015-10-28 DIAGNOSIS — Z780 Asymptomatic menopausal state: Secondary | ICD-10-CM

## 2015-10-28 NOTE — Patient Instructions (Addendum)
  Ms. Papendick ,  Thank you for taking time to come for your Medicare Wellness Visit. I appreciate your ongoing commitment to your health goals. Please review the following plan we discussed and let me know if I can assist you in the future.   These are the goals we discussed: Goals    . Exercise 3x per week (30 min per time)          Walk for 30 minutes 3 time per week       This is a list of the screening recommended for you and due dates:  Health Maintenance  Topic Date Due  . Pneumonia vaccines (2 of 2 - PPSV23) 09/08/2015  . Shingles Vaccine  03/16/2016*  . DEXA scan (bone density measurement)  01/09/2016  . Mammogram  01/22/2016  . Tetanus Vaccine  09/13/2025  . Flu Shot  Completed  *Topic was postponed. The date shown is not the original due date.   Schedule an appointment with Shelah Lewandowsky if you have anymore brown vaginal discharge or bleeding or if you have any pain, pressure, or discomfort in your abdomen or pelvis.

## 2015-10-30 ENCOUNTER — Ambulatory Visit (INDEPENDENT_AMBULATORY_CARE_PROVIDER_SITE_OTHER): Payer: Medicare Other | Admitting: Family

## 2015-10-30 VITALS — BP 134/63 | HR 74 | Temp 96.9°F | Ht 61.25 in | Wt 150.4 lb

## 2015-10-30 DIAGNOSIS — N3001 Acute cystitis with hematuria: Secondary | ICD-10-CM | POA: Diagnosis not present

## 2015-10-30 DIAGNOSIS — R8299 Other abnormal findings in urine: Secondary | ICD-10-CM

## 2015-10-30 DIAGNOSIS — N939 Abnormal uterine and vaginal bleeding, unspecified: Secondary | ICD-10-CM

## 2015-10-30 DIAGNOSIS — R82998 Other abnormal findings in urine: Secondary | ICD-10-CM

## 2015-10-30 LAB — MICROSCOPIC EXAMINATION: Renal Epithel, UA: >10 /hpf — AB

## 2015-10-30 LAB — URINALYSIS, COMPLETE
Bilirubin, UA: NEGATIVE
GLUCOSE, UA: NEGATIVE
NITRITE UA: NEGATIVE
SPEC GRAV UA: 1.02 (ref 1.005–1.030)
UUROB: 1 mg/dL (ref 0.2–1.0)
pH, UA: 5.5 (ref 5.0–7.5)

## 2015-10-30 MED ORDER — SULFAMETHOXAZOLE-TRIMETHOPRIM 800-160 MG PO TABS: 1.0000 | ORAL_TABLET | Freq: Two times a day (BID) | ORAL | 0 refills | Status: DC

## 2015-10-30 NOTE — Progress Notes (Addendum)
Subjective:   Colette Balboni is a 80 y.o. female who presents for a Subsequent Medicare Annual Wellness Visit. Ms Yamin lives at home alone. She does not have any pets. She is very active and does all of her own yardwork and housework. She mows, weedeats, trims her bushes, etc. She does a few stairs into her home but she doesn't have any problems with them. She does not have any pets. Ms Sabey has 2 sons and 2 daughters and 3 grandchildren. She is retired from the [employer].   Review of Systems     Cardiac Risk Factors include: advanced age (>13men, >83 women);dyslipidemia;hypertension  Gynecologic: patient reports 2 episodes of thin brown vaginal discharge several days ago. No pain or pressure and no recurrence.   Patient states that her health is about the same as it was last year.    Objective:    Today's Vitals   10/28/15 1355  BP: 119/64  Pulse: 73  Weight: 149 lb (67.6 kg)  Height: 5' 1.25" (1.556 m)   Body mass index is 27.92 kg/m.   Current Medications (verified) Outpatient Encounter Prescriptions as of 10/28/2015  Medication Sig  . atenolol (TENORMIN) 50 MG tablet Take 1 tablet (50 mg total) by mouth daily.  . enalapril-hydrochlorothiazide (VASERETIC) 10-25 MG tablet Take 1 tablet by mouth daily.  Marland Kitchen levothyroxine (SYNTHROID) 75 MCG tablet Take 1 tablet (75 mcg total) by mouth daily before breakfast.  . LORazepam (ATIVAN) 0.5 MG tablet Take 1 tablet (0.5 mg total) by mouth 2 (two) times daily as needed. for anxiety  . Multiple Vitamin (MULTIVITAMIN WITH MINERALS) TABS tablet Take 1 tablet by mouth daily.  Marland Kitchen omeprazole (PRILOSEC) 20 MG capsule Take 1 capsule (20 mg total) by mouth daily.  . simvastatin (ZOCOR) 40 MG tablet Take 1 tablet (40 mg total) by mouth at bedtime.  . [DISCONTINUED] meloxicam (MOBIC) 15 MG tablet Take 1 tablet (15 mg total) by mouth daily.   No facility-administered encounter medications on file as of 10/28/2015.     Allergies  (verified) Review of patient's allergies indicates no known allergies.   History: Past Medical History:  Diagnosis Date  . Cataract   . Cholelithiasis   . Diverticulosis   . Fibrocystic breast disease   . GAD (generalized anxiety disorder)   . GERD (gastroesophageal reflux disease)   . Hyperlipidemia   . Hypertension   . Hypothyroidism   . Metabolic syndrome   . Osteopenia    Past Surgical History:  Procedure Laterality Date  . BREAST SURGERY     fibriotic cyst removed  . CHOLECYSTECTOMY     Family History  Problem Relation Age of Onset  . Hypertension Mother   . Hypertension Father   . Hyperlipidemia Sister   . Hyperlipidemia Brother   . Hyperlipidemia Sister   . Hyperlipidemia Sister   . Healthy Daughter   . Healthy Son   . Healthy Daughter   . Healthy Son    Social History   Occupational History  . homemaker    Social History Main Topics  . Smoking status: Never Smoker  . Smokeless tobacco: Never Used  . Alcohol use No  . Drug use: No  . Sexual activity: No    Tobacco Counseling No tobacco use  Activities of Daily Living In your present state of health, do you have any difficulty performing the following activities: 10/28/2015  Hearing? N  Vision? N  Difficulty concentrating or making decisions? N  Walking or climbing stairs?  N  Dressing or bathing? N  Doing errands, shopping? N  Preparing Food and eating ? N  Using the Toilet? N  In the past six months, have you accidently leaked urine? N  Do you have problems with loss of bowel control? N  Managing your Medications? N  Managing your Finances? N  Housekeeping or managing your Housekeeping? N  Some recent data might be hidden    Immunizations and Health Maintenance Immunization History  Administered Date(s) Administered  . Influenza,inj,Quad PF,36+ Mos 11/02/2012, 10/29/2013, 11/11/2014, 10/22/2015  . Pneumococcal Conjugate-13 09/08/2014  . Tdap 09/14/2015   Health Maintenance Due    Topic Date Due  . PNA vac Low Risk Adult (2 of 2 - PPSV23) 09/08/2015    Patient Care Team: Chevis Pretty, FNP as PCP - General (Nurse Practitioner) Irene Shipper, MD as Consulting Physician (Gastroenterology) Truc Manus Gunning, OD as Consulting Physician (Optometry)      Assessment:   This is a routine wellness examination for Riven.   Hearing/Vision screen No hearing or vision deficits noted.  Dietary issues and exercise activities discussed: Exercise limited by: None identified Patient eats three meals per day that she prepares.  Goals    . Exercise 3x per week (30 min per time)          Walk for 30 minutes 3 time per week      Depression Screen PHQ 2/9 Scores 10/30/2015 10/28/2015 09/14/2015 03/12/2015 12/10/2014 10/20/2014 09/08/2014  PHQ - 2 Score 0 0 0 0 0 0 0    Fall Risk Fall Risk  10/30/2015 10/28/2015 09/14/2015 03/12/2015 12/10/2014  Falls in the past year? No No No No No    Cognitive Function: MMSE - Mini Mental State Exam 10/28/2015 10/20/2014  Orientation to time 5 5  Orientation to Place 5 5  Registration 3 3  Attention/ Calculation 5 -  Recall 1 3  Language- name 2 objects 2 2  Language- repeat 1 1  Language- follow 3 step command 3 3  Language- read & follow direction 1 1  Write a sentence 1 1  Copy design 1 1  Total score 28 -    Screening Tests Health Maintenance  Topic Date Due  . PNA vac Low Risk Adult (2 of 2 - PPSV23) 09/08/2015  . ZOSTAVAX  03/16/2016 (Originally 02/27/1997)  . DEXA SCAN  01/09/2016  . MAMMOGRAM  01/22/2016  . TETANUS/TDAP  09/13/2025  . INFLUENZA VACCINE  Completed      Plan:  Continue to stay active around the house and be careful not to fall.   Schedule an appointment with Shelah Lewandowsky if you have anymore brown vaginal discharge or bleeding or if you have any pain, pressure, or discomfort in your abdomen or pelvis. (Appointment offered but patient preferred to wait and see if it returned.)  During the course of the  visit, Pollyanna was educated and counseled about the following appropriate screening and preventive services:   Vaccines: Shingles vaccine declined. Others are up to date.   Colorectal cancer screening-done in 2014  Bone density screening-scheduled  Diabetes screening-with routine labs  Glaucoma screening-up to date  Nutrition counseling  Patient Instructions (the written plan) were given to the patient.    Chong Sicilian, RN  10/30/2015   I have reviewed and agree with the above AWV documentation.   Mary-Margaret Hassell Done, FNP

## 2015-10-30 NOTE — Patient Instructions (Signed)
Asymptomatic Bacteriuria, Female Asymptomatic bacteriuria is the presence of a large number of bacteria in your urine without the usual symptoms of burning or frequent urination. The following conditions increase the risk of asymptomatic bacteriuria:  Diabetes mellitus.  Advanced age.  Pregnancy in the first trimester.  Kidney stones.  Kidney transplants.  Leaky kidney tube valve in young children (reflux). Treatment for this condition is not needed in most people and can lead to other problems such as too much yeast and growth of resistant bacteria. However, some people, such as pregnant women, do need treatment to prevent kidney infection. Asymptomatic bacteriuria in pregnancy is also associated with fetal growth restriction, premature labor, and newborn death. HOME CARE INSTRUCTIONS Monitor your condition for any changes. The following actions may help to relieve any discomfort you are feeling:  Drink enough water and fluids to keep your urine clear or pale yellow. Go to the bathroom more often to keep your bladder empty.  Keep the area around your vagina and rectum clean. Wipe yourself from front to back after urinating. SEEK IMMEDIATE MEDICAL CARE IF:  You develop signs of an infection such as:  Burning with urination.  Frequency of voiding.  Back pain.  Fever.  You have blood in the urine.  You develop a fever. MAKE SURE YOU:  Understand these instructions.  Will watch your condition.  Will get help right away if you are not doing well or get worse.   This information is not intended to replace advice given to you by your health care provider. Make sure you discuss any questions you have with your health care provider.   Document Released: 01/17/2005 Document Revised: 02/07/2014 Document Reviewed: 07/09/2012 Elsevier Interactive Patient Education 2016 Else[employer] Inc[employer]  

## 2015-10-30 NOTE — Progress Notes (Signed)
   Subjective:    Patient ID: Janice Zamora, female    DOB: Aug 16, 1935, 80 y.o.   MRN: [id number]  Pt presents to the office today with vaginal bleeding that started over the last week.  Vaginal Discharge  The patient's primary symptoms include vaginal bleeding. This is a new problem. The current episode started in the past 7 days. The problem occurs intermittently. The problem has been waxing and waning. Associated symptoms include abdominal pain, constipation, discolored urine, flank pain and hematuria. Pertinent negatives include no diarrhea or dysuria. The vaginal discharge was brown. The vaginal bleeding is spotting. She has tried nothing for the symptoms. The treatment provided no relief.      Review of Systems  Gastrointestinal: Positive for abdominal pain and constipation. Negative for diarrhea.  Genitourinary: Positive for flank pain and hematuria. Negative for dysuria.  All other systems reviewed and are negative.      Objective:   Physical Exam  Constitutional: She is oriented to person, place, and time. She appears well-developed and well-nourished. No distress.  HENT:  Head: Normocephalic.  Eyes: Pupils are equal, round, and reactive to light.  Neck: Normal range of motion. Neck supple. No thyromegaly present.  Cardiovascular: Normal rate, regular rhythm, normal heart sounds and intact distal pulses.   No murmur heard. Pulmonary/Chest: Effort normal and breath sounds normal. No respiratory distress. She has no wheezes.  Abdominal: Soft. Bowel sounds are normal. She exhibits no distension. There is no tenderness.  Genitourinary: No vaginal discharge found.  Genitourinary Comments: Vagina erythemas  Musculoskeletal: Normal range of motion. She exhibits no edema or tenderness.  Neurological: She is alert and oriented to person, place, and time.  Skin: Skin is warm and dry.  Psychiatric: She has a normal mood and affect. Her behavior is normal. Judgment and thought content  normal.  Vitals reviewed.     BP 134/63   Pulse 74   Temp (!) 96.9 F (36.1 C) (Oral)   Ht 5' 1.25" (1.556 m)   Wt 150 lb 6.4 oz (68.2 kg)   BMI 28.19 kg/m      Assessment & Plan:  1. Dark urine - Urinalysis, Complete  2. Acute cystitis with hematuria -Force fluids AZO over the counter X2 days RTO prn Culture pending - Urine culture -Bactrim Prescription sent to pharmacy    3. Vagina bleeding - Pap IG (Image Guided)  Evelina Dun, FNP

## 2015-11-02 LAB — URINE CULTURE

## 2015-11-03 LAB — PAP IG (IMAGE GUIDED): PAP Smear Comment: 0

## 2015-11-24 ENCOUNTER — Encounter: Payer: Self-pay | Admitting: Physician Assistant

## 2015-11-24 ENCOUNTER — Ambulatory Visit (INDEPENDENT_AMBULATORY_CARE_PROVIDER_SITE_OTHER): Payer: Medicare Other | Admitting: Physician Assistant

## 2015-11-24 VITALS — BP 137/61 | HR 60 | Temp 96.7°F | Ht 61.25 in | Wt 149.4 lb

## 2015-11-24 DIAGNOSIS — A09 Infectious gastroenteritis and colitis, unspecified: Secondary | ICD-10-CM

## 2015-11-24 DIAGNOSIS — R197 Diarrhea, unspecified: Secondary | ICD-10-CM

## 2015-11-24 DIAGNOSIS — N390 Urinary tract infection, site not specified: Secondary | ICD-10-CM | POA: Diagnosis not present

## 2015-11-24 DIAGNOSIS — N3001 Acute cystitis with hematuria: Secondary | ICD-10-CM | POA: Diagnosis not present

## 2015-11-24 LAB — URINALYSIS, COMPLETE
BILIRUBIN UA: NEGATIVE
Glucose, UA: NEGATIVE
Ketones, UA: NEGATIVE
Nitrite, UA: NEGATIVE
PH UA: 6 (ref 5.0–7.5)
PROTEIN UA: NEGATIVE
Specific Gravity, UA: 1.015 (ref 1.005–1.030)
Urobilinogen, Ur: 1 mg/dL (ref 0.2–1.0)

## 2015-11-24 LAB — MICROSCOPIC EXAMINATION

## 2015-11-24 MED ORDER — FLUCONAZOLE 150 MG PO TABS: 150.0000 mg | ORAL_TABLET | Freq: Once | ORAL | 2 refills | Status: AC

## 2015-11-24 MED ORDER — SULFAMETHOXAZOLE-TRIMETHOPRIM 800-160 MG PO TABS: 1.0000 | ORAL_TABLET | Freq: Two times a day (BID) | ORAL | 1 refills | Status: DC

## 2015-11-24 NOTE — Patient Instructions (Signed)

## 2015-11-24 NOTE — Progress Notes (Signed)
BP 137/61   Pulse 60   Temp (!) 96.7 F (35.9 C) (Oral)   Ht 5' 1.25" (1.556 m)   Wt 149 lb 6.4 oz (67.8 kg)   BMI 28.00 kg/m    Subjective:    Patient ID: Janice Zamora, female    DOB: 1936/01/29, 80 y.o.   MRN: [id number]  HPI: Janice Zamora is a 80 y.o. female presenting on 11/24/2015 for Diarrhea (Patient states that she feels like she is having diarrhea coming from the rectum and vagina. ) and Vaginal Bleeding (Patient states she has been also having some vaginal spotting since before 10/28/2015) On September 29 the patient came in for urinary tract infection and the culture did grow Klebsiella greater than 100,000 colonies. She was placed on Bactrim DS she completed the course. She states that she had some improvement in her symptoms. However since then she has returned with the spotting when she wipes. She is unsure if it is urinary or vaginal. Yesterday she had a significant bout of diarrhea and felt that it was coming from both areas. She does not know of any fistula or connection that she has to her colon. She has had a colonoscopy before and has been about 3 years since the last one. She denies any fevers or chills at this time.   Past Medical History:  Diagnosis Date  . Cataract   . Cholelithiasis   . Diverticulosis   . Fibrocystic breast disease   . GAD (generalized anxiety disorder)   . GERD (gastroesophageal reflux disease)   . Hyperlipidemia   . Hypertension   . Hypothyroidism   . Metabolic syndrome   . Osteopenia    Relevant past medical, surgical, family and social history reviewed and updated as indicated. Interim medical history since our last visit reviewed. Allergies and medications reviewed and updated. DATA REVIEWED: CHART IN EPIC  Social History   Social History  . Marital status: Widowed    Spouse name: N/A  . Number of children: 4  . Years of education: N/A   Occupational History  . homemaker    Social History Main Topics  . Smoking  status: Never Smoker  . Smokeless tobacco: Never Used  . Alcohol use No  . Drug use: No  . Sexual activity: No   Other Topics Concern  . Not on file   Social History Narrative  . No narrative on file    Past Surgical History:  Procedure Laterality Date  . BREAST SURGERY     fibriotic cyst removed  . CHOLECYSTECTOMY      Family History  Problem Relation Age of Onset  . Hypertension Mother   . Hypertension Father   . Hyperlipidemia Sister   . Hyperlipidemia Brother   . Hyperlipidemia Sister   . Hyperlipidemia Sister   . Healthy Daughter   . Healthy Son   . Healthy Daughter   . Healthy Son     Review of Systems  Constitutional: Negative.   HENT: Negative.   Eyes: Negative.   Respiratory: Negative.   Gastrointestinal: Positive for abdominal pain and diarrhea. Negative for anal bleeding.  Genitourinary: Positive for difficulty urinating, dysuria, hematuria, urgency and vaginal bleeding. Negative for flank pain, vaginal discharge and vaginal pain.      Medication List       Accurate as of 11/24/15  1:32 PM. Always use your most recent med list.          atenolol 50 MG tablet Commonly known as:  TENORMIN Take 1 tablet (50 mg total) by mouth daily.   enalapril-hydrochlorothiazide 10-25 MG tablet Commonly known as:  VASERETIC Take 1 tablet by mouth daily.   fluconazole 150 MG tablet Commonly known as:  DIFLUCAN Take 1 tablet (150 mg total) by mouth once. Take one weekly as needed for yeast   levothyroxine 75 MCG tablet Commonly known as:  SYNTHROID Take 1 tablet (75 mcg total) by mouth daily before breakfast.   LORazepam 0.5 MG tablet Commonly known as:  ATIVAN Take 1 tablet (0.5 mg total) by mouth 2 (two) times daily as needed. for anxiety   multivitamin with minerals Tabs tablet Take 1 tablet by mouth daily.   omeprazole 20 MG capsule Commonly known as:  PRILOSEC Take 1 capsule (20 mg total) by mouth daily.   simvastatin 40 MG tablet Commonly  known as:  ZOCOR Take 1 tablet (40 mg total) by mouth at bedtime.   sulfamethoxazole-trimethoprim 800-160 MG tablet Commonly known as:  BACTRIM DS Take 1 tablet by mouth 2 (two) times daily.          Objective:    BP 137/61   Pulse 60   Temp (!) 96.7 F (35.9 C) (Oral)   Ht 5' 1.25" (1.556 m)   Wt 149 lb 6.4 oz (67.8 kg)   BMI 28.00 kg/m   No Known Allergies  Wt Readings from Last 3 Encounters:  11/24/15 149 lb 6.4 oz (67.8 kg)  10/30/15 150 lb 6.4 oz (68.2 kg)  10/28/15 149 lb (67.6 kg)    Physical Exam  Constitutional: She is oriented to person, place, and time. She appears well-developed and well-nourished.  HENT:  Head: Normocephalic and atraumatic.  Eyes: Conjunctivae are normal. Pupils are equal, round, and reactive to light.  Cardiovascular: Normal rate, regular rhythm, normal heart sounds and intact distal pulses.   Pulmonary/Chest: Effort normal and breath sounds normal.  Abdominal: Soft. Bowel sounds are normal. She exhibits no distension and no mass. There is tenderness in the suprapubic area. There is no rebound, no guarding and no CVA tenderness.  Neurological: She is alert and oriented to person, place, and time. She has normal reflexes.  Skin: Skin is warm and dry. No rash noted.  Psychiatric: She has a normal mood and affect. Her behavior is normal. Judgment and thought content normal.  Nursing note and vitals reviewed.   Results for orders placed or performed in visit on 11/24/15  Microscopic Examination  Result Value Ref Range   WBC, UA 6-10 (A) 0 - 5 /hpf   RBC, UA 0-2 0 - 2 /hpf   Epithelial Cells (non renal) 0-10 0 - 10 /hpf   Bacteria, UA Few None seen/Few  Urinalysis, Complete  Result Value Ref Range   Specific Gravity, UA 1.015 1.005 - 1.030   pH, UA 6.0 5.0 - 7.5   Color, UA Yellow Yellow   Appearance Ur Clear Clear   Leukocytes, UA 2+ (A) Negative   Protein, UA Negative Negative/Trace   Glucose, UA Negative Negative   Ketones, UA  Negative Negative   RBC, UA Trace (A) Negative   Bilirubin, UA Negative Negative   Urobilinogen, Ur 1.0 0.2 - 1.0 mg/dL   Nitrite, UA Negative Negative   Microscopic Examination See below:       Assessment & Plan:   1. Acute cystitis with hematuria - sulfamethoxazole-trimethoprim (BACTRIM DS) 800-160 MG tablet; Take 1 tablet by mouth 2 (two) times daily.  Dispense: 20 tablet; Refill: 1 - fluconazole (DIFLUCAN)  150 MG tablet; Take 1 tablet (150 mg total) by mouth once. Take one weekly as needed for yeast  Dispense: 4 tablet; Refill: 2 - Urinalysis, Complete - Ambulatory referral to Urology - Microscopic Examination  2. Recurrent UTI - sulfamethoxazole-trimethoprim (BACTRIM DS) 800-160 MG tablet; Take 1 tablet by mouth 2 (two) times daily.  Dispense: 20 tablet; Refill: 1 - Urine culture - Ambulatory referral to Urology - Microscopic Examination  3. Diarrhea of presumed infectious origin   Continue all other maintenance medications as listed above.  Follow up plan: Return if symptoms worsen or fail to improve.  Orders Placed This Encounter  Procedures  . Urine culture  . Microscopic Examination  . Urinalysis, Complete  . Ambulatory referral to Urology    Educational handout given for UTI.  Terald Sleeper PA-C [facility] [address]  [place], [place] [address] [phone]   11/24/2015, 1:32 PM

## 2015-11-25 LAB — URINE CULTURE

## 2015-12-04 ENCOUNTER — Other Ambulatory Visit: Payer: Self-pay | Admitting: *Deleted

## 2015-12-04 DIAGNOSIS — F411 Generalized anxiety disorder: Secondary | ICD-10-CM

## 2015-12-04 MED ORDER — LORAZEPAM 0.5 MG PO TABS
0.5000 mg | ORAL_TABLET | Freq: Two times a day (BID) | ORAL | 1 refills | Status: DC | PRN
Start: 2015-12-04 — End: 2015-12-22

## 2015-12-04 NOTE — Telephone Encounter (Signed)
Please call in ativan with 1 refills 

## 2015-12-04 NOTE — Telephone Encounter (Signed)
rx called into pharmacy

## 2015-12-04 NOTE — Telephone Encounter (Signed)
Last written 09/2015 - have nurse phone in if approved

## 2015-12-22 ENCOUNTER — Ambulatory Visit (INDEPENDENT_AMBULATORY_CARE_PROVIDER_SITE_OTHER): Payer: Medicare Other | Admitting: Nurse Practitioner

## 2015-12-22 ENCOUNTER — Encounter: Payer: Self-pay | Admitting: Nurse Practitioner

## 2015-12-22 ENCOUNTER — Other Ambulatory Visit: Payer: Self-pay | Admitting: Nurse Practitioner

## 2015-12-22 VITALS — BP 134/62 | HR 63 | Temp 97.2°F | Ht 61.0 in | Wt 151.0 lb

## 2015-12-22 DIAGNOSIS — I10 Essential (primary) hypertension: Secondary | ICD-10-CM

## 2015-12-22 DIAGNOSIS — E785 Hyperlipidemia, unspecified: Secondary | ICD-10-CM

## 2015-12-22 DIAGNOSIS — N898 Other specified noninflammatory disorders of vagina: Secondary | ICD-10-CM | POA: Diagnosis not present

## 2015-12-22 DIAGNOSIS — E663 Overweight: Secondary | ICD-10-CM

## 2015-12-22 DIAGNOSIS — F411 Generalized anxiety disorder: Secondary | ICD-10-CM

## 2015-12-22 DIAGNOSIS — K219 Gastro-esophageal reflux disease without esophagitis: Secondary | ICD-10-CM

## 2015-12-22 DIAGNOSIS — E034 Atrophy of thyroid (acquired): Secondary | ICD-10-CM

## 2015-12-22 DIAGNOSIS — Z1231 Encounter for screening mammogram for malignant neoplasm of breast: Secondary | ICD-10-CM

## 2015-12-22 MED ORDER — LORAZEPAM 0.5 MG PO TABS: 0.5000 mg | ORAL_TABLET | Freq: Two times a day (BID) | ORAL | 1 refills | Status: DC | PRN

## 2015-12-22 MED ORDER — SIMVASTATIN 40 MG PO TABS: 40.0000 mg | ORAL_TABLET | Freq: Every day | ORAL | 5 refills | Status: DC

## 2015-12-22 MED ORDER — ENALAPRIL-HYDROCHLOROTHIAZIDE 10-25 MG PO TABS: 1.0000 | ORAL_TABLET | Freq: Every day | ORAL | 5 refills | Status: DC

## 2015-12-22 MED ORDER — LEVOTHYROXINE SODIUM 75 MCG PO TABS: 75.0000 ug | ORAL_TABLET | Freq: Every day | ORAL | 5 refills | Status: DC

## 2015-12-22 MED ORDER — OMEPRAZOLE 20 MG PO CPDR: 20.0000 mg | DELAYED_RELEASE_CAPSULE | Freq: Every day | ORAL | 5 refills | Status: DC

## 2015-12-22 MED ORDER — ATENOLOL 50 MG PO TABS: 50.0000 mg | ORAL_TABLET | Freq: Every day | ORAL | 5 refills | Status: DC

## 2015-12-22 NOTE — Progress Notes (Signed)
Subjective:    Patient ID: Janice Zamora, female    DOB: May 23, 1935, 80 y.o.   MRN: [id number]    Patient here today for follow up of chronic medical problems.  Outpatient Encounter Prescriptions as of 09/14/2015  Medication Sig  . atenolol (TENORMIN) 50 MG tablet Take 1 tablet (50 mg total) by mouth daily.  . enalapril-hydrochlorothiazide (VASERETIC) 10-25 MG tablet TAKE ONE TABLET BY MOUTH ONCE DAILY  . levothyroxine (SYNTHROID) 75 MCG tablet Take 1 tablet (75 mcg total) by mouth daily before breakfast.  . LORazepam (ATIVAN) 0.5 MG tablet Take 1 tablet (0.5 mg total) by mouth 2 (two) times daily as needed. for anxiety  . Multiple Vitamin (MULTIVITAMIN WITH MINERALS) TABS tablet Take 1 tablet by mouth daily.  Marland Kitchen omeprazole (PRILOSEC) 20 MG capsule Take 1 capsule (20 mg total) by mouth daily.  . simvastatin (ZOCOR) 40 MG tablet Take 1 tablet (40 mg total) by mouth at bedtime.   * Still having vaginal discharge daily- it changes in color- no odor- no itching or burning. Has appointment with urologist for recurrent UTI. Has not seen GYN for this discharge.  Hypertension  This is a chronic problem. The current episode started more than 1 year ago. The problem is unchanged. The problem is controlled. Pertinent negatives include no chest pain, headaches, neck pain, palpitations or shortness of breath. Risk factors for coronary artery disease include dyslipidemia and post-menopausal state. Past treatments include ACE inhibitors, diuretics and beta blockers. The current treatment provides moderate improvement. Compliance problems include exercise.  Hypertensive end-organ damage includes a thyroid problem.  Hyperlipidemia  This is a chronic problem. The current episode started more than 1 year ago. The problem is controlled. Recent lipid tests were reviewed and are normal. Exacerbating diseases include hypothyroidism. She has no history of diabetes or obesity. Factors aggravating her hyperlipidemia  include thiazides. Pertinent negatives include no chest pain, myalgias or shortness of breath. Current antihyperlipidemic treatment includes statins. The current treatment provides moderate improvement of lipids. Compliance problems include adherence to diet and adherence to exercise.  Risk factors for coronary artery disease include dyslipidemia, hypertension and post-menopausal.  Thyroid Problem  Presents for follow-up (hypothyroidism) visit. Patient reports no constipation, diarrhea, fatigue or palpitations. Her past medical history is significant for hyperlipidemia. There is no history of diabetes.  GAD She takes 1-2 0.5 mg lorazepam as needed per week. She states she becomes nervous and her mind starts racing unpredictably. The lorazepam works well.  GERD On omeprazole 80m daily- keeps symptoms under control. States her symptoms come back if she forgets to take this for a day or two.  Metabolic syndrome Patient does not watch carbs in diet- last HGBA1 c was 6.3%- will recheck today   Review of Systems  Constitutional: Negative for appetite change and fatigue.  HENT: Negative.   Respiratory: Negative for shortness of breath.   Cardiovascular: Negative for chest pain, palpitations and leg swelling.  Gastrointestinal: Negative for abdominal distention, abdominal pain, blood in stool, constipation and diarrhea.  Genitourinary: Negative.   Musculoskeletal: Negative for arthralgias, myalgias and neck pain.  Neurological: Negative for dizziness, light-headedness and headaches.  All other systems reviewed and are negative.      Objective:   Physical Exam  Constitutional: She is oriented to person, place, and time. She appears well-developed and well-nourished.  HENT:  Nose: Nose normal.  Mouth/Throat: Oropharynx is clear and moist.  Eyes: EOM are normal.  Neck: Trachea normal, normal range of motion and full passive range  of motion without pain. Neck supple. No JVD present. Carotid bruit  is not present. No thyromegaly present.  Cardiovascular: Normal rate, regular rhythm, normal heart sounds and intact distal pulses.  Exam reveals no gallop and no friction rub.   No murmur heard. Pulmonary/Chest: Effort normal and breath sounds normal.  Abdominal: Soft. Bowel sounds are normal. She exhibits no distension and no mass. There is no tenderness.  Genitourinary:  Genitourinary Comments: Deferred to gyn  Musculoskeletal: Normal range of motion.  Lymphadenopathy:    She has no cervical adenopathy.  Neurological: She is alert and oriented to person, place, and time. She has normal reflexes.  Skin: Skin is warm and dry.  Psychiatric: She has a normal mood and affect. Her behavior is normal. Judgment and thought content normal.  Vitals reviewed.  BP 134/62   Pulse 63   Temp 97.2 F (36.2 C) (Oral)   Ht _0  (1.549 m)   Wt 151 lb (68.5 kg)   BMI 28.53 kg/m      Assessment & Plan:  1. Vaginal discharge - Ambulatory referral to Gynecology  2. Essential hypertension Low sodium diet - atenolol (TENORMIN) 50 MG tablet; Take 1 tablet (50 mg total) by mouth daily.  Dispense: 30 tablet; Refill: 5 - enalapril-hydrochlorothiazide (VASERETIC) 10-25 MG tablet; Take 1 tablet by mouth daily.  Dispense: 30 tablet; Refill: 5 - CMP14+EGFR  3. GAD (generalized anxiety disorder) Stress management - LORazepam (ATIVAN) 0.5 MG tablet; Take 1 tablet (0.5 mg total) by mouth 2 (two) times daily as needed. for anxiety  Dispense: 30 tablet; Refill: 1  4. Gastroesophageal reflux disease without esophagitis Avoid spicy foods Do not eat 2 hours prior to bedtime - omeprazole (PRILOSEC) 20 MG capsule; Take 1 capsule (20 mg total) by mouth daily.  Dispense: 30 capsule; Refill: 5  5. Hyperlipidemia, unspecified hyperlipidemia type Low fat diet - simvastatin (ZOCOR) 40 MG tablet; Take 1 tablet (40 mg total) by mouth at bedtime.  Dispense: 30 tablet; Refill: 5 - Lipid panel  6. Hypothyroidism due  to acquired atrophy of thyroid - levothyroxine (SYNTHROID) 75 MCG tablet; Take 1 tablet (75 mcg total) by mouth daily before breakfast.  Dispense: 30 tablet; Refill: 5  7. Overweight (BMI 25.0-29.9) Discussed diet and exercise for person with BMI >25 Will recheck weight in 3-6 months    Labs pending Health maintenance reviewed Diet and exercise encouraged Continue all meds Follow up  In 15month  Mary-Margaret Adalyna Godbee, FNP

## 2015-12-22 NOTE — Addendum Note (Signed)
Addended by: Chevis Pretty on: 12/22/2015 11:48 AM   Modules accepted: Orders

## 2015-12-23 LAB — CMP14+EGFR
ALBUMIN: 4.6 g/dL (ref 3.5–4.8)
ALK PHOS: 71 IU/L (ref 39–117)
ALT: 12 IU/L (ref 0–32)
AST: 15 IU/L (ref 0–40)
Albumin/Globulin Ratio: 1.7 (ref 1.2–2.2)
BILIRUBIN TOTAL: 0.4 mg/dL (ref 0.0–1.2)
BUN / CREAT RATIO: 19 (ref 12–28)
BUN: 17 mg/dL (ref 8–27)
CHLORIDE: 96 mmol/L (ref 96–106)
CO2: 25 mmol/L (ref 18–29)
CREATININE: 0.91 mg/dL (ref 0.57–1.00)
Calcium: 10.1 mg/dL (ref 8.7–10.3)
GFR calc Af Amer: 70 mL/min/{1.73_m2} (ref >59)
GFR calc non Af Amer: 61 mL/min/{1.73_m2} (ref >59)
GLUCOSE: 115 mg/dL — AB (ref 65–99)
Globulin, Total: 2.7 g/dL (ref 1.5–4.5)
Potassium: 4.3 mmol/L (ref 3.5–5.2)
Sodium: 138 mmol/L (ref 134–144)
TOTAL PROTEIN: 7.3 g/dL (ref 6.0–8.5)

## 2015-12-23 LAB — LIPID PANEL
CHOLESTEROL TOTAL: 165 mg/dL (ref 100–199)
Chol/HDL Ratio: 3.4 ratio units (ref 0.0–4.4)
HDL: 48 mg/dL (ref >39)
LDL CALC: 88 mg/dL (ref 0–99)
Triglycerides: 144 mg/dL (ref 0–149)
VLDL CHOLESTEROL CAL: 29 mg/dL (ref 5–40)

## 2016-01-11 ENCOUNTER — Other Ambulatory Visit (INDEPENDENT_AMBULATORY_CARE_PROVIDER_SITE_OTHER): Payer: Medicare Other

## 2016-01-11 ENCOUNTER — Other Ambulatory Visit: Payer: Medicare Other

## 2016-01-11 DIAGNOSIS — Z780 Asymptomatic menopausal state: Secondary | ICD-10-CM | POA: Diagnosis not present

## 2016-01-19 ENCOUNTER — Encounter: Payer: Self-pay | Admitting: Adult Health

## 2016-01-19 ENCOUNTER — Ambulatory Visit (INDEPENDENT_AMBULATORY_CARE_PROVIDER_SITE_OTHER): Payer: Medicare Other | Admitting: Urology

## 2016-01-19 ENCOUNTER — Ambulatory Visit (INDEPENDENT_AMBULATORY_CARE_PROVIDER_SITE_OTHER): Payer: Medicare Other | Admitting: Adult Health

## 2016-01-19 VITALS — BP 130/60 | HR 80 | Ht 62.0 in | Wt 152.5 lb

## 2016-01-19 DIAGNOSIS — N952 Postmenopausal atrophic vaginitis: Secondary | ICD-10-CM

## 2016-01-19 DIAGNOSIS — B9689 Other specified bacterial agents as the cause of diseases classified elsewhere: Secondary | ICD-10-CM

## 2016-01-19 DIAGNOSIS — N760 Acute vaginitis: Secondary | ICD-10-CM

## 2016-01-19 DIAGNOSIS — N898 Other specified noninflammatory disorders of vagina: Secondary | ICD-10-CM | POA: Diagnosis not present

## 2016-01-19 DIAGNOSIS — N3001 Acute cystitis with hematuria: Secondary | ICD-10-CM | POA: Diagnosis not present

## 2016-01-19 LAB — POCT WET PREP (WET MOUNT)
Clue Cells Wet Prep Whiff POC: NEGATIVE
WBC, Wet Prep HPF POC: POSITIVE

## 2016-01-19 MED ORDER — METRONIDAZOLE 500 MG PO TABS: 500.0000 mg | ORAL_TABLET | Freq: Two times a day (BID) | ORAL | 0 refills | Status: DC

## 2016-01-19 MED ORDER — ESTROGENS, CONJUGATED 0.625 MG/GM VA CREA: TOPICAL_CREAM | VAGINAL | 0 refills | Status: DC

## 2016-01-19 NOTE — Progress Notes (Signed)
Subjective:     Patient ID: Janice Zamora, female   DOB: Jun 18, 1935, 80 y.o.   MRN: [id number]  HPI Janice Zamora is a 80 year old white female in complaining of vaginal discharge for about 4 weeks, has been brown to green and itches at times, no odor. No bleeding.Has had UTI recently, and has appt with urology today. PCP is [place]. She says she feel recently and ankle slightly swollen.   Review of Systems +vaginal discharge for about 4 weeks  Occasion itch Recent UTI   Reviewed past medical,surgical, social and family history. Reviewed medications and allergies.     Objective:   Physical Exam BP 130/60 (BP Location: Right Arm, Patient Position: Sitting, Cuff Size: Normal)   Pulse 80   Ht 5\' 2"  (1.575 m)   Wt 152 lb 8 oz (69.2 kg)   BMI 27.89 kg/m   PHQ 2 score 0. Skin warm and dry.Pelvic: external genitalia is normal in appearance no lesions, vagina:red, with loss of rugae and moisture, has greenish brown discharge, no odor, some thickening of skin at introitus,urethra has no lesions or masses noted, cervix:smooth and atrophic, uterus: normal size, shape and contour, non tender, no masses felt, adnexa: no masses or tenderness noted. Bladder is non tender and no masses felt. Wet prep: + for clue cells and +WBCs. Dr Glo Herring in for co exam and agrees with vaginal atrophy, and using PVC 2 x weekly.     Assessment:     1. Vaginal discharge   2. BV (bacterial vaginosis)   3. Vaginal atrophy       Plan:     Meds ordered this encounter  Medications  . metroNIDAZOLE (FLAGYL) 500 MG tablet    Sig: Take 1 tablet (500 mg total) by mouth 2 (two) times daily.    Dispense:  14 tablet    Refill:  0    Order Specific Question:   Supervising Provider    Answer:   Elonda Husky, LUTHER H [[id number]]  . conjugated estrogens (PREMARIN) vaginal cream    Sig: Use 1 gm 2 x weekly    Dispense:  16 g    Refill:  0    Order Specific Question:   Supervising Provider    Answer:   Tania Ade H  [[id number]]  Follow up in 4 weeks Review handout on BV and vaginal atrophy

## 2016-01-19 NOTE — Patient Instructions (Signed)
Bacterial Vaginosis Bacterial vaginosis is a vaginal infection that occurs when the normal balance of bacteria in the vagina is disrupted. It results from an overgrowth of certain bacteria. This is the most common vaginal infection among women ages 82-44. Because bacterial vaginosis increases your risk for STIs (sexually transmitted infections), getting treated can help reduce your risk for chlamydia, gonorrhea, herpes, and HIV (human immunodeficiency virus). Treatment is also important for preventing complications in pregnant women, because this condition can cause an early (premature) delivery. What are the causes? This condition is caused by an increase in harmful bacteria that are normally present in small amounts in the vagina. However, the reason that the condition develops is not fully understood. What increases the risk? The following factors may make you more likely to develop this condition:  Having a new sexual partner or multiple sexual partners.  Having unprotected sex.  Douching.  Having an intrauterine device (IUD).  Smoking.  Drug and alcohol abuse.  Taking certain antibiotic medicines.  Being pregnant. You cannot get bacterial vaginosis from toilet seats, bedding, swimming pools, or contact with objects around you. What are the signs or symptoms? Symptoms of this condition include:  Grey or white vaginal discharge. The discharge can also be watery or foamy.  A fish-like odor with discharge, especially after sexual intercourse or during menstruation.  Itching in and around the vagina.  Burning or pain with urination. Some women with bacterial vaginosis have no signs or symptoms. How is this diagnosed? This condition is diagnosed based on:  Your medical history.  A physical exam of the vagina.  Testing a sample of vaginal fluid under a microscope to look for a large amount of bad bacteria or abnormal cells. Your health care provider may use a cotton swab or a  small wooden spatula to collect the sample. How is this treated? This condition is treated with antibiotics. These may be given as a pill, a vaginal cream, or a medicine that is put into the vagina (suppository). If the condition comes back after treatment, a second round of antibiotics may be needed. Follow these instructions at home: Medicines  Take over-the-counter and prescription medicines only as told by your health care provider.  Take or use your antibiotic as told by your health care provider. Do not stop taking or using the antibiotic even if you start to feel better. General instructions  If you have a female sexual partner, tell her that you have a vaginal infection. She should see her health care provider and be treated if she has symptoms. If you have a female sexual partner, he does not need treatment.  During treatment:  Avoid sexual activity until you finish treatment.  Do not douche.  Avoid alcohol as directed by your health care provider.  Avoid breastfeeding as directed by your health care provider.  Drink enough water and fluids to keep your urine clear or pale yellow.  Keep the area around your vagina and rectum clean.  Wash the area daily with warm water.  Wipe yourself from front to back after using the toilet.  Keep all follow-up visits as told by your health care provider. This is important. How is this prevented?  Do not douche.  Wash the outside of your vagina with warm water only.  Use protection when having sex. This includes latex condoms and dental dams.  Limit how many sexual partners you have. To help prevent bacterial vaginosis, it is best to have sex with just one partner (monogamous).  Make sure you and your sexual partner are tested for STIs.  Wear cotton or cotton-lined underwear.  Avoid wearing tight pants and pantyhose, especially during summer.  Limit the amount of alcohol that you drink.  Do not use any products that contain  nicotine or tobacco, such as cigarettes and e-cigarettes. If you need help quitting, ask your health care provider.  Do not use illegal drugs. Where to find more information:  Centers for Disease Control and Prevention: AppraiserFraud.fi  American Sexual Health Association (ASHA): www.ashastd.org  [place] Department of Health and Financial controller, Office on Women's Health: DustingSprays.pl or SecuritiesCard.it Contact a health care provider if:  Your symptoms do not improve, even after treatment.  You have more discharge or pain when urinating.  You have a fever.  You have pain in your abdomen.  You have pain during sex.  You have vaginal bleeding between periods. Summary  Bacterial vaginosis is a vaginal infection that occurs when the normal balance of bacteria in the vagina is disrupted.  Because bacterial vaginosis increases your risk for STIs (sexually transmitted infections), getting treated can help reduce your risk for chlamydia, gonorrhea, herpes, and HIV (human immunodeficiency virus). Treatment is also important for preventing complications in pregnant women, because the condition can cause an early (premature) delivery.  This condition is treated with antibiotic medicines. These may be given as a pill, a vaginal cream, or a medicine that is put into the vagina (suppository). This information is not intended to replace advice given to you by your health care provider. Make sure you discuss any questions you have with your health care provider. Document Released: 01/17/2005 Document Revised: 10/03/2015 Document Reviewed: 10/03/2015 Elsevier Interactive Patient Education  2017 Elsevier Inc. Atrophic Vaginitis Introduction Atrophic vaginitis is a condition in which the tissues that line the vagina become dry and thin. This condition is most common in women who have stopped having regular menstrual periods (menopause). This usually  starts when a woman is 80-72 years old. Estrogen helps to keep the vagina moist. It stimulates the vagina to produce a clear fluid that lubricates the vagina for sexual intercourse. This fluid also protects the vagina from infection. Lack of estrogen can cause the lining of the vagina to get thinner and dryer. The vagina may also shrink in size. It may become less elastic. Atrophic vaginitis tends to get worse over time as a woman's estrogen level drops. What are the causes? This condition is caused by the normal drop in estrogen that happens around the time of menopause. What increases the risk? Certain conditions or situations may lower a woman's estrogen level, which increases her risk of atrophic vaginitis. These include:  Taking medicine that blocks estrogen.  Having ovaries removed surgically.  Being treated for cancer with X-ray treatment (radiation) or medicines (chemotherapy).  Exercising very hard and often.  Having an eating disorder (anorexia).  Giving birth or breastfeeding.  Being over the age of 62.  Smoking. What are the signs or symptoms? Symptoms of this condition include:  Pain, soreness, or bleeding during sexual intercourse (dyspareunia).  Vaginal burning, irritation, or itching.  Pain or bleeding during a vaginal examination using a speculum (pelvic exam).  Loss of interest in sexual activity.  Having burning pain when passing urine.  Vaginal discharge that is brown or yellow. In some cases, there are no symptoms. How is this diagnosed? This condition is diagnosed with a medical history and physical exam. This will include a pelvic exam that checks whether the inside  of your vagina appears pale, thin, or dry. Rarely, you may also have other tests, including:  A urine test.  A test that checks the acid balance in your vaginal fluid (acid balance test). How is this treated? Treatment for this condition may depend on the severity of your symptoms.  Treatment may include:  Using an over-the-counter vaginal lubricant before you have sexual intercourse.  Using a long-acting vaginal moisturizer.  Using low-dose vaginal estrogen for moderate to severe symptoms that do not respond to other treatments. Options include creams, tablets, and inserts (vaginal rings). Before using vaginal estrogen, tell your health care provider if you have a history of:  Breast cancer.  Endometrial cancer.  Blood clots.  Taking medicines. You may be able to take a daily pill for dyspareunia. Discuss all of the risks of this medicine with your health care provider. It is usually not recommended for women who have a family history or personal history of breast cancer. If your symptoms are very mild and you are not sexually active, you may not need treatment. Follow these instructions at home:  Take medicines only as directed by your health care provider. Do not use herbal or alternative medicines unless your health care provider says that you can.  Use over-the-counter creams, lubricants, or moisturizers for dryness only as directed by your health care provider.  If your atrophic vaginitis is caused by menopause, discuss all of your menopausal symptoms and treatment options with your health care provider.  Do not douche.  Do not use products that can make your vagina dry. These include:  Scented feminine sprays.  Scented tampons.  Scented soaps.  If it hurts to have sex, talk with your sexual partner. Contact a health care provider if:  Your discharge looks different than normal.  Your vagina has an unusual smell.  You have new symptoms.  Your symptoms do not improve with treatment.  Your symptoms get worse. This information is not intended to replace advice given to you by your health care provider. Make sure you discuss any questions you have with your health care provider. Document Released: 06/03/2014 Document Revised: 06/25/2015 Document  Reviewed: 01/08/2014  2017 Elsevier Follow up in 4 weeks Use premarin vaginal cream 1 gm 2 x weekly

## 2016-01-26 ENCOUNTER — Ambulatory Visit
Admission: RE | Admit: 2016-01-26 | Discharge: 2016-01-26 | Disposition: A | Discharge status: Home / Self Care | Payer: Medicare Other | Source: Ambulatory Visit | Attending: Nurse Practitioner | Admitting: Nurse Practitioner

## 2016-01-26 DIAGNOSIS — Z1231 Encounter for screening mammogram for malignant neoplasm of breast: Secondary | ICD-10-CM

## 2016-02-16 ENCOUNTER — Encounter: Payer: Self-pay | Admitting: Adult Health

## 2016-02-16 ENCOUNTER — Ambulatory Visit (INDEPENDENT_AMBULATORY_CARE_PROVIDER_SITE_OTHER): Payer: PPO | Admitting: Adult Health

## 2016-02-16 VITALS — BP 130/60 | HR 79 | Ht 62.0 in | Wt 153.0 lb

## 2016-02-16 DIAGNOSIS — N952 Postmenopausal atrophic vaginitis: Secondary | ICD-10-CM

## 2016-02-16 DIAGNOSIS — N898 Other specified noninflammatory disorders of vagina: Secondary | ICD-10-CM

## 2016-02-16 MED ORDER — ESTROGENS, CONJUGATED 0.625 MG/GM VA CREA: TOPICAL_CREAM | VAGINAL | 0 refills | Status: DC

## 2016-02-16 NOTE — Progress Notes (Signed)
Subjective:     Patient ID: Janice Zamora, female   DOB: 27-Aug-1935, 81 y.o.   MRN: [id number]  HPI Janice Zamora is a 81 year old white female, back in follow up of using premarin vaginal cream, but used only 3 tubes and stopped, still has discharge at times.  Review of Systems Vaginal discharge at times Reviewed past medical,surgical, social and family history. Reviewed medications and allergies.     Objective:   Physical Exam BP 130/60 (BP Location: Left Arm, Patient Position: Sitting, Cuff Size: Normal)   Pulse 79   Ht 5\' 2"  (1.575 m)   Wt 153 lb (69.4 kg)   BMI 27.98 kg/m   PHQ 2 score 0. Skin warm and dry.Pelvic: external genitalia is normal in appearance no lesions, vagina: pinker with loss of moisture and rugae, no discharge today,urethra has no lesions or masses noted, cervix:smooth, and atrophic, uterus: normal size, shape and contour, non tender, no masses felt, adnexa: no masses or tenderness noted. Bladder is non tender and no masses felt.    Explained that I would liek for her to try PVC again and she said she would, daughter with erh today.Samples given lot [id number] exp 10/18, as she said she would not buy it costs too much.  Assessment:       1. Vaginal atrophy   2. Vaginal discharge    Plan:     Meds ordered this encounter  Medications  . conjugated estrogens (PREMARIN) vaginal cream    Sig: Use 1/2 gm 2 x weekly    Dispense:  24 g    Refill:  0    Order Specific Question:   Supervising Provider    Answer:   Tania Ade H [[id number]]  Follow up in 6 months or sooner if needed

## 2016-03-21 ENCOUNTER — Ambulatory Visit: Payer: Medicare Other | Admitting: Nurse Practitioner

## 2016-04-04 ENCOUNTER — Ambulatory Visit (INDEPENDENT_AMBULATORY_CARE_PROVIDER_SITE_OTHER): Payer: PPO | Admitting: Nurse Practitioner

## 2016-04-04 VITALS — BP 128/59 | HR 64 | Temp 96.6°F | Ht 62.0 in | Wt 157.0 lb

## 2016-04-04 DIAGNOSIS — E785 Hyperlipidemia, unspecified: Secondary | ICD-10-CM

## 2016-04-04 DIAGNOSIS — F411 Generalized anxiety disorder: Secondary | ICD-10-CM | POA: Diagnosis not present

## 2016-04-04 DIAGNOSIS — K219 Gastro-esophageal reflux disease without esophagitis: Secondary | ICD-10-CM

## 2016-04-04 DIAGNOSIS — R7303 Prediabetes: Secondary | ICD-10-CM | POA: Insufficient documentation

## 2016-04-04 DIAGNOSIS — I10 Essential (primary) hypertension: Secondary | ICD-10-CM

## 2016-04-04 DIAGNOSIS — E034 Atrophy of thyroid (acquired): Secondary | ICD-10-CM | POA: Diagnosis not present

## 2016-04-04 MED ORDER — LEVOTHYROXINE SODIUM 75 MCG PO TABS: 75.0000 ug | ORAL_TABLET | Freq: Every day | ORAL | 1 refills | Status: DC

## 2016-04-04 MED ORDER — ENALAPRIL-HYDROCHLOROTHIAZIDE 10-25 MG PO TABS: 1.0000 | ORAL_TABLET | Freq: Every day | ORAL | 5 refills | Status: DC

## 2016-04-04 MED ORDER — OMEPRAZOLE 20 MG PO CPDR: 20.0000 mg | DELAYED_RELEASE_CAPSULE | Freq: Every day | ORAL | 5 refills | Status: DC

## 2016-04-04 MED ORDER — LORAZEPAM 0.5 MG PO TABS: 0.5000 mg | ORAL_TABLET | Freq: Two times a day (BID) | ORAL | 1 refills | Status: DC | PRN

## 2016-04-04 MED ORDER — ENALAPRIL-HYDROCHLOROTHIAZIDE 10-25 MG PO TABS: 1.0000 | ORAL_TABLET | Freq: Every day | ORAL | 1 refills | Status: DC

## 2016-04-04 MED ORDER — ATENOLOL 50 MG PO TABS: 50.0000 mg | ORAL_TABLET | Freq: Every day | ORAL | 1 refills | Status: DC

## 2016-04-04 MED ORDER — LEVOTHYROXINE SODIUM 75 MCG PO TABS: 75.0000 ug | ORAL_TABLET | Freq: Every day | ORAL | 5 refills | Status: DC

## 2016-04-04 MED ORDER — SIMVASTATIN 40 MG PO TABS: 40.0000 mg | ORAL_TABLET | Freq: Every day | ORAL | 1 refills | Status: DC

## 2016-04-04 MED ORDER — ATENOLOL 50 MG PO TABS: 50.0000 mg | ORAL_TABLET | Freq: Every day | ORAL | 5 refills | Status: DC

## 2016-04-04 MED ORDER — SIMVASTATIN 40 MG PO TABS: 40.0000 mg | ORAL_TABLET | Freq: Every day | ORAL | 5 refills | Status: DC

## 2016-04-04 MED ORDER — OMEPRAZOLE 20 MG PO CPDR: 20.0000 mg | DELAYED_RELEASE_CAPSULE | Freq: Every day | ORAL | 1 refills | Status: DC

## 2016-04-04 NOTE — Patient Instructions (Signed)
Diet for Metabolic Syndrome Metabolic syndrome is a disorder that includes at least three of these conditions:  Abdominal obesity.  Too much sugar in your blood.  High blood pressure.  Higher than normal amount of fat (lipids) in your blood.  Lower than normal level of "good" cholesterol (HDL). Following a healthy diet can help to keep metabolic syndrome under control. It can also help to prevent the development of conditions that are associated with metabolic syndrome, such as diabetes, heart disease, and stroke. Along with exercise, a healthy diet:  Helps to improve the way that the body uses insulin.  Promotes weight loss. A common goal for people with this condition is to lose at least 7 to 10 percent of their starting weight. What do I need to know about this diet?  Use the glycemic index (GI) to plan your meals. The index tells you how quickly a food will raise your blood sugar. Choose foods that have low GI values. These foods take a longer time to raise blood sugar.  Keep track of how many calories you take in. Eating the right amount of calories will help your achieve a healthy weight.  You may want to follow a Mediterranean diet. This diet includes lots of vegetables, lean meats or fish, whole grains, fruits, and healthy oils and fats. What foods can I eat? Grains  Stone-ground whole wheat. Pumpernickel bread. Whole-grain bread, crackers, tortillas, cereal, and pasta. Unsweetened oatmeal.Bulgur.Barley.Quinoa.Brown rice or wild rice. Vegetables  Lettuce. Spinach. Peas. Beets. Cauliflower. Cabbage. Broccoli. Carrots. Tomatoes. Squash. Eggplant. Herbs. Peppers. Onions. Cucumbers. Brussels sprouts. Sweet potatoes. Yams. Beans. Lentils. Fruits  Berries. Apples. Oranges. Grapes. Mango. Pomegranate. Kiwi. Cherries. Meats and Other Protein Sources  Seafood and shellfish. Lean meats.Poultry. Tofu. Dairy  Low-fat or fat-free dairy products, such as milk, yogurt, and  cheese. Beverages  Water. Low-fat milk. Milk alternatives, like soy milk or almond milk. Real fruit juice. Condiments  Low-sugar or sugar-free ketchup, barbecue sauce, and mayonnaise. Mustard. Relish. Fats and Oils  Avocado. Canola or olive oil. Nuts and nut butters.Seeds. The items listed above may not be a complete list of recommended foods or beverages. Contact your dietitian for more options.  What foods are not recommended? Red meat. Palm oil and coconut oil. Processed foods. Fried foods. Alcohol. Sweetened drinks, such as iced tea and soda. Sweets. Salty foods. The items listed above may not be a complete list of foods and beverages to avoid. Contact your dietitian for more information.  This information is not intended to replace advice given to you by your health care provider. Make sure you discuss any questions you have with your health care provider. Document Released: 06/03/2014 Document Revised: 05/29/2015 Document Reviewed: 01/29/2014 Elsevier Interactive Patient Education  2017 Elsevier Inc.  

## 2016-04-04 NOTE — Progress Notes (Signed)
Subjective:    Patient ID: Janice Zamora, female    DOB: March 05, 1935, 81 y.o.   MRN: [id number]  Patient here today for follow up of chronic medical problems. No changes since last visit. No complaints today.  Outpatient Encounter Prescriptions as of 09/14/2015  Medication Sig  . atenolol (TENORMIN) 50 MG tablet Take 1 tablet (50 mg total) by mouth daily.  . enalapril-hydrochlorothiazide (VASERETIC) 10-25 MG tablet TAKE ONE TABLET BY MOUTH ONCE DAILY  . levothyroxine (SYNTHROID) 75 MCG tablet Take 1 tablet (75 mcg total) by mouth daily before breakfast.  . LORazepam (ATIVAN) 0.5 MG tablet Take 1 tablet (0.5 mg total) by mouth 2 (two) times daily as needed. for anxiety  . Multiple Vitamin (MULTIVITAMIN WITH MINERALS) TABS tablet Take 1 tablet by mouth daily.  Marland Kitchen omeprazole (PRILOSEC) 20 MG capsule Take 1 capsule (20 mg total) by mouth daily.  . simvastatin (ZOCOR) 40 MG tablet Take 1 tablet (40 mg total) by mouth at bedtime.   Hypertension  This is a chronic problem. The current episode started more than 1 year ago. The problem is unchanged. The problem is controlled. Pertinent negatives include no chest pain, headaches, neck pain, palpitations or shortness of breath. Risk factors for coronary artery disease include dyslipidemia and post-menopausal state. Past treatments include ACE inhibitors, diuretics and beta blockers. The current treatment provides moderate improvement. Compliance problems include exercise.  Identifiable causes of hypertension include a thyroid problem.  Hyperlipidemia  This is a chronic problem. The current episode started more than 1 year ago. The problem is controlled. Recent lipid tests were reviewed and are normal. Exacerbating diseases include hypothyroidism. She has no history of diabetes or obesity. Factors aggravating her hyperlipidemia include thiazides. Pertinent negatives include no chest pain, myalgias or shortness of breath. Current antihyperlipidemic treatment  includes statins. The current treatment provides moderate improvement of lipids. Compliance problems include adherence to diet and adherence to exercise.  Risk factors for coronary artery disease include dyslipidemia, hypertension and post-menopausal.  Thyroid Problem  Presents for follow-up (hypothyroidism) visit. Patient reports no constipation, diarrhea, fatigue or palpitations. Her past medical history is significant for hyperlipidemia. There is no history of diabetes.  GAD She takes 1-2 0.5 mg lorazepam as needed per week. She states she becomes nervous and her mind starts racing unpredictably. The lorazepam works well.  GERD On omeprazole '20mg'$  daily- keeps symptoms under control. States her symptoms come back if she forgets to take this for a day or two.  Metabolic syndrome Patient does not watch carbs in diet. Last HbA1c 6.5, will continue to monitor.   Review of Systems  Constitutional: Negative for appetite change and fatigue.  HENT: Negative.   Respiratory: Negative for shortness of breath.   Cardiovascular: Negative for chest pain, palpitations and leg swelling.  Gastrointestinal: Negative for abdominal distention, abdominal pain, blood in stool, constipation and diarrhea.  Genitourinary: Negative.   Musculoskeletal: Negative for arthralgias, myalgias and neck pain.  Neurological: Negative for dizziness, light-headedness and headaches.  All other systems reviewed and are negative.      Objective:   Physical Exam  Constitutional: She is oriented to person, place, and time. She appears well-developed and well-nourished.  HENT:  Nose: Nose normal.  Mouth/Throat: Oropharynx is clear and moist.  Eyes: EOM are normal.  Neck: Trachea normal, normal range of motion and full passive range of motion without pain. Neck supple. No JVD present. Carotid bruit is not present. No thyromegaly present.  Cardiovascular: Normal rate, regular rhythm, normal heart  sounds and intact distal  pulses.  Exam reveals no gallop and no friction rub.   No murmur heard. Pulmonary/Chest: Effort normal and breath sounds normal.  Abdominal: Soft. Bowel sounds are normal. She exhibits no distension and no mass. There is no tenderness.  Musculoskeletal: Normal range of motion.  Lymphadenopathy:    She has no cervical adenopathy.  Neurological: She is alert and oriented to person, place, and time. She has normal reflexes.  Skin: Skin is warm and dry.  Psychiatric: She has a normal mood and affect. Her behavior is normal. Judgment and thought content normal.  Vitals reviewed.    BP (!) 128/59   Pulse 64   Temp (!) 96.6 F (35.9 C) (Oral)   Ht '5\' 2"'$  (1.575 m)   Wt 157 lb (71.2 kg)   BMI 28.72 kg/m   Assessment & Plan:  1. Essential hypertension DASH diet - atenolol (TENORMIN) 50 MG tablet; Take 1 tablet (50 mg total) by mouth daily.  Dispense: 30 tablet; Refill: 5 - enalapril-hydrochlorothiazide (VASERETIC) 10-25 MG tablet; Take 1 tablet by mouth daily.  Dispense: 30 tablet; Refill: 5 - CMP14+EGFR  2. GAD (generalized anxiety disorder) Stress management, low acidic foods - LORazepam (ATIVAN) 0.5 MG tablet; Take 1 tablet (0.5 mg total) by mouth 2 (two) times daily as needed. for anxiety  Dispense: 30 tablet; Refill: 1  3. Gastroesophageal reflux disease without esophagitis Avoid spicy foods Do not eat 2 hours prior to bedtime - omeprazole (PRILOSEC) 20 MG capsule; Take 1 capsule (20 mg total) by mouth daily.  Dispense: 30 capsule; Refill: 5  4. Hyperlipidemia, unspecified hyperlipidemia type Low fat diet - simvastatin (ZOCOR) 40 MG tablet; Take 1 tablet (40 mg total) by mouth at bedtime.  Dispense: 30 tablet; Refill: 5 - Lipid panel  5. Hypothyroidism due to acquired atrophy of thyroid - levothyroxine (SYNTHROID) 75 MCG tablet; Take 1 tablet (75 mcg total) by mouth daily before breakfast.  Dispense: 30 tablet; Refill: 5 - Thyroid Panel With TSH  6. Pre-diabetes Low-Carb  diet    Labs pending Health maintenance reviewed Diet and exercise encouraged Continue all meds Follow up  In 3 months  [place], FNP

## 2016-04-05 LAB — CMP14+EGFR
ALBUMIN: 4.3 g/dL (ref 3.5–4.8)
ALK PHOS: 71 IU/L (ref 39–117)
ALT: 15 IU/L (ref 0–32)
AST: 17 IU/L (ref 0–40)
Albumin/Globulin Ratio: 1.8 (ref 1.2–2.2)
BUN / CREAT RATIO: 25 (ref 12–28)
BUN: 22 mg/dL (ref 8–27)
Bilirubin Total: 0.4 mg/dL (ref 0.0–1.2)
CO2: 26 mmol/L (ref 18–29)
CREATININE: 0.89 mg/dL (ref 0.57–1.00)
Calcium: 9.5 mg/dL (ref 8.7–10.3)
Chloride: 97 mmol/L (ref 96–106)
GFR, EST AFRICAN AMERICAN: 71 mL/min/{1.73_m2} (ref >59)
GFR, EST NON AFRICAN AMERICAN: 62 mL/min/{1.73_m2} (ref >59)
GLOBULIN, TOTAL: 2.4 g/dL (ref 1.5–4.5)
Glucose: 116 mg/dL — ABNORMAL HIGH (ref 65–99)
Potassium: 3.5 mmol/L (ref 3.5–5.2)
SODIUM: 140 mmol/L (ref 134–144)
Total Protein: 6.7 g/dL (ref 6.0–8.5)

## 2016-04-05 LAB — LIPID PANEL
CHOL/HDL RATIO: 3.7 ratio (ref 0.0–4.4)
CHOLESTEROL TOTAL: 133 mg/dL (ref 100–199)
HDL: 36 mg/dL — ABNORMAL LOW (ref >39)
LDL CALC: 71 mg/dL (ref 0–99)
Triglycerides: 130 mg/dL (ref 0–149)
VLDL Cholesterol Cal: 26 mg/dL (ref 5–40)

## 2016-04-05 LAB — THYROID PANEL WITH TSH
Free Thyroxine Index: 2.7 (ref 1.2–4.9)
T3 Uptake Ratio: 28 % (ref 24–39)
T4 TOTAL: 9.8 ug/dL (ref 4.5–12.0)
TSH: 0.396 u[IU]/mL — AB (ref 0.450–4.500)

## 2016-07-07 ENCOUNTER — Encounter: Payer: Self-pay | Admitting: Nurse Practitioner

## 2016-07-07 ENCOUNTER — Other Ambulatory Visit: Payer: Self-pay | Admitting: Nurse Practitioner

## 2016-07-07 ENCOUNTER — Ambulatory Visit (INDEPENDENT_AMBULATORY_CARE_PROVIDER_SITE_OTHER): Payer: PPO | Admitting: Nurse Practitioner

## 2016-07-07 VITALS — BP 125/56 | HR 60 | Temp 96.6°F | Ht 62.0 in | Wt 157.0 lb

## 2016-07-07 DIAGNOSIS — F411 Generalized anxiety disorder: Secondary | ICD-10-CM

## 2016-07-07 DIAGNOSIS — I10 Essential (primary) hypertension: Secondary | ICD-10-CM | POA: Diagnosis not present

## 2016-07-07 DIAGNOSIS — E782 Mixed hyperlipidemia: Secondary | ICD-10-CM

## 2016-07-07 DIAGNOSIS — R7303 Prediabetes: Secondary | ICD-10-CM | POA: Diagnosis not present

## 2016-07-07 DIAGNOSIS — E034 Atrophy of thyroid (acquired): Secondary | ICD-10-CM

## 2016-07-07 DIAGNOSIS — E8881 Metabolic syndrome: Secondary | ICD-10-CM

## 2016-07-07 DIAGNOSIS — K219 Gastro-esophageal reflux disease without esophagitis: Secondary | ICD-10-CM | POA: Diagnosis not present

## 2016-07-07 DIAGNOSIS — E663 Overweight: Secondary | ICD-10-CM

## 2016-07-07 NOTE — Telephone Encounter (Signed)
Please call in lorazepam with 1 refills 

## 2016-07-07 NOTE — Telephone Encounter (Signed)
Just seen this am. Call in

## 2016-07-07 NOTE — Telephone Encounter (Signed)
rx called into pharmacy

## 2016-07-07 NOTE — Progress Notes (Signed)
Subjective:    Patient ID: Janice Zamora, female    DOB: 12/12/1935, 81 y.o.   MRN: [id number]  HPI  Janice Zamora is here today for follow up of chronic medical problem.  Outpatient Encounter Prescriptions as of 07/07/2016  Medication Sig  . atenolol (TENORMIN) 50 MG tablet Take 1 tablet (50 mg total) by mouth daily.  Marland Kitchen conjugated estrogens (PREMARIN) vaginal cream Use 1/2 gm 2 x weekly  . enalapril-hydrochlorothiazide (VASERETIC) 10-25 MG tablet Take 1 tablet by mouth daily.  Marland Kitchen levothyroxine (SYNTHROID) 75 MCG tablet Take 1 tablet (75 mcg total) by mouth daily before breakfast.  . LORazepam (ATIVAN) 0.5 MG tablet Take 1 tablet (0.5 mg total) by mouth 2 (two) times daily as needed. for anxiety  . Multiple Vitamin (MULTIVITAMIN WITH MINERALS) TABS tablet Take 1 tablet by mouth daily.  Marland Kitchen omeprazole (PRILOSEC) 20 MG capsule Take 1 capsule (20 mg total) by mouth daily.  . simvastatin (ZOCOR) 40 MG tablet Take 1 tablet (40 mg total) by mouth at bedtime.   No facility-administered encounter medications on file as of 07/07/2016.     1. Essential hypertension  No c/o chest pain ,SOB or HA- does ot check blood pressures at home  2. Gastroesophageal reflux disease without esophagitis  Omeprazole 20mg  daily- no symptoms when she takes meds  3. Hypothyroidism due to acquired atrophy of thyroid  No problems  5. Overweight (BMI 25.0-29.9)   no recent weight changes  6. Metabolic syndrome  Does not check blood sugars at home  7. Mixed hyperlipidemia  Does not watch diet or exercise  8. GAD (generalized anxiety disorder)  Takes ativan- usually only needs 1x per day    New complaints: None today     Review of Systems  Constitutional: Negative for diaphoresis.  HENT: Negative.   Eyes: Negative for pain.  Respiratory: Negative for shortness of breath.   Cardiovascular: Negative for chest pain, palpitations and leg swelling.  Gastrointestinal: Negative for abdominal pain.  Endocrine:  Negative for polydipsia.  Genitourinary: Negative.   Musculoskeletal: Negative.   Skin: Negative for rash.  Neurological: Negative for dizziness, weakness and headaches.  Hematological: Does not bruise/bleed easily.  Psychiatric/Behavioral: Negative.   All other systems reviewed and are negative.      Objective:   Physical Exam  Constitutional: She is oriented to person, place, and time. She appears well-developed and well-nourished.  HENT:  Nose: Nose normal.  Mouth/Throat: Oropharynx is clear and moist.  Eyes: EOM are normal.  Neck: Trachea normal, normal range of motion and full passive range of motion without pain. Neck supple. No JVD present. Carotid bruit is not present. No thyromegaly present.  Cardiovascular: Normal rate, regular rhythm, normal heart sounds and intact distal pulses.  Exam reveals no gallop and no friction rub.   No murmur heard. Pulmonary/Chest: Effort normal and breath sounds normal.  Abdominal: Soft. Bowel sounds are normal. She exhibits no distension and no mass. There is no tenderness.  Musculoskeletal: Normal range of motion.  Lymphadenopathy:    She has no cervical adenopathy.  Neurological: She is alert and oriented to person, place, and time. She has normal reflexes.  Skin: Skin is warm and dry.  Psychiatric: She has a normal mood and affect. Her behavior is normal. Judgment and thought content normal.   BP (!) 125/56   Pulse 60   Temp (!) 96.6 F (35.9 C) (Oral)   Ht 5\' 2"  (1.575 m)   Wt 157 lb (71.2 kg)  BMI 28.72 kg/m       Assessment & Plan:  1. Essential hypertension Low sodium diet  2. Gastroesophageal reflux disease without esophagitis Avoid spicy foods Do not eat 2 hours prior to bedtime  3. Hypothyroidism due to acquired atrophy of thyroid  4. Overweight (BMI 25.0-29.9) Discussed diet and exercise for person with BMI >25 Will recheck weight in 3-6 months  5. Metabolic syndrome Watch carbs in diet  6. Mixed  hyperlipidemia Low fat diet  7. GAD (generalized anxiety disorder) Stress management    Labs pending Health maintenance reviewed Diet and exercise encouraged Continue all meds Follow up  In 3 month   [place], FNP

## 2016-07-07 NOTE — Patient Instructions (Signed)

## 2016-07-07 NOTE — Addendum Note (Signed)
Addended by: Chevis Pretty on: 07/07/2016 08:45 AM   Modules accepted: Orders

## 2016-07-08 LAB — CMP14+EGFR
A/G RATIO: 1.6 (ref 1.2–2.2)
ALBUMIN: 4.1 g/dL (ref 3.5–4.8)
ALK PHOS: 64 IU/L (ref 39–117)
ALT: 16 IU/L (ref 0–32)
AST: 19 IU/L (ref 0–40)
BILIRUBIN TOTAL: 0.4 mg/dL (ref 0.0–1.2)
BUN / CREAT RATIO: 15 (ref 12–28)
BUN: 13 mg/dL (ref 8–27)
CHLORIDE: 102 mmol/L (ref 96–106)
CO2: 27 mmol/L (ref 18–29)
Calcium: 9.6 mg/dL (ref 8.7–10.3)
Creatinine, Ser: 0.87 mg/dL (ref 0.57–1.00)
GFR calc Af Amer: 73 mL/min/{1.73_m2} (ref >59)
GFR calc non Af Amer: 64 mL/min/{1.73_m2} (ref >59)
GLUCOSE: 115 mg/dL — AB (ref 65–99)
Globulin, Total: 2.5 g/dL (ref 1.5–4.5)
POTASSIUM: 3.9 mmol/L (ref 3.5–5.2)
Sodium: 145 mmol/L — ABNORMAL HIGH (ref 134–144)
Total Protein: 6.6 g/dL (ref 6.0–8.5)

## 2016-07-08 LAB — LIPID PANEL
Chol/HDL Ratio: 3.6 ratio (ref 0.0–4.4)
Cholesterol, Total: 145 mg/dL (ref 100–199)
HDL: 40 mg/dL (ref >39)
LDL Calculated: 75 mg/dL (ref 0–99)
Triglycerides: 150 mg/dL — ABNORMAL HIGH (ref 0–149)
VLDL CHOLESTEROL CAL: 30 mg/dL (ref 5–40)

## 2016-10-20 ENCOUNTER — Ambulatory Visit (INDEPENDENT_AMBULATORY_CARE_PROVIDER_SITE_OTHER): Payer: PPO | Admitting: Nurse Practitioner

## 2016-10-20 ENCOUNTER — Encounter: Payer: Self-pay | Admitting: Nurse Practitioner

## 2016-10-20 VITALS — BP 139/64 | HR 60 | Temp 96.9°F | Ht 62.0 in | Wt 157.0 lb

## 2016-10-20 DIAGNOSIS — M8580 Other specified disorders of bone density and structure, unspecified site: Secondary | ICD-10-CM | POA: Diagnosis not present

## 2016-10-20 DIAGNOSIS — E663 Overweight: Secondary | ICD-10-CM

## 2016-10-20 DIAGNOSIS — K219 Gastro-esophageal reflux disease without esophagitis: Secondary | ICD-10-CM | POA: Diagnosis not present

## 2016-10-20 DIAGNOSIS — E034 Atrophy of thyroid (acquired): Secondary | ICD-10-CM | POA: Diagnosis not present

## 2016-10-20 DIAGNOSIS — F411 Generalized anxiety disorder: Secondary | ICD-10-CM | POA: Diagnosis not present

## 2016-10-20 DIAGNOSIS — E782 Mixed hyperlipidemia: Secondary | ICD-10-CM | POA: Diagnosis not present

## 2016-10-20 DIAGNOSIS — N920 Excessive and frequent menstruation with regular cycle: Secondary | ICD-10-CM

## 2016-10-20 DIAGNOSIS — E785 Hyperlipidemia, unspecified: Secondary | ICD-10-CM

## 2016-10-20 DIAGNOSIS — I10 Essential (primary) hypertension: Secondary | ICD-10-CM | POA: Diagnosis not present

## 2016-10-20 DIAGNOSIS — R739 Hyperglycemia, unspecified: Secondary | ICD-10-CM | POA: Diagnosis not present

## 2016-10-20 LAB — CMP14+EGFR
A/G RATIO: 1.8 (ref 1.2–2.2)
ALT: 11 IU/L (ref 0–32)
AST: 16 IU/L (ref 0–40)
Albumin: 4.5 g/dL (ref 3.5–4.8)
Alkaline Phosphatase: 70 IU/L (ref 39–117)
BILIRUBIN TOTAL: 0.4 mg/dL (ref 0.0–1.2)
BUN / CREAT RATIO: 22 (ref 12–28)
BUN: 22 mg/dL (ref 8–27)
CHLORIDE: 99 mmol/L (ref 96–106)
CO2: 24 mmol/L (ref 20–29)
Calcium: 9.9 mg/dL (ref 8.7–10.3)
Creatinine, Ser: 1.02 mg/dL — ABNORMAL HIGH (ref 0.57–1.00)
GFR calc non Af Amer: 52 mL/min/{1.73_m2} — ABNORMAL LOW (ref >59)
GFR, EST AFRICAN AMERICAN: 60 mL/min/{1.73_m2} (ref >59)
Globulin, Total: 2.5 g/dL (ref 1.5–4.5)
Glucose: 134 mg/dL — ABNORMAL HIGH (ref 65–99)
POTASSIUM: 4.5 mmol/L (ref 3.5–5.2)
Sodium: 139 mmol/L (ref 134–144)
TOTAL PROTEIN: 7 g/dL (ref 6.0–8.5)

## 2016-10-20 LAB — LIPID PANEL
CHOLESTEROL TOTAL: 142 mg/dL (ref 100–199)
Chol/HDL Ratio: 3.6 ratio (ref 0.0–4.4)
HDL: 40 mg/dL (ref >39)
LDL Calculated: 71 mg/dL (ref 0–99)
Triglycerides: 154 mg/dL — ABNORMAL HIGH (ref 0–149)
VLDL Cholesterol Cal: 31 mg/dL (ref 5–40)

## 2016-10-20 MED ORDER — ATENOLOL 50 MG PO TABS: 50.0000 mg | ORAL_TABLET | Freq: Every day | ORAL | 1 refills | Status: DC

## 2016-10-20 MED ORDER — LORAZEPAM 0.5 MG PO TABS: 0.5000 mg | ORAL_TABLET | Freq: Two times a day (BID) | ORAL | 2 refills | Status: DC | PRN

## 2016-10-20 MED ORDER — SIMVASTATIN 40 MG PO TABS
40.0000 mg | ORAL_TABLET | Freq: Every day | ORAL | 1 refills | Status: DC
Start: 2016-10-20 — End: 2017-01-20

## 2016-10-20 MED ORDER — OMEPRAZOLE 20 MG PO CPDR: 20.0000 mg | DELAYED_RELEASE_CAPSULE | Freq: Every day | ORAL | 1 refills | Status: DC

## 2016-10-20 MED ORDER — LEVOTHYROXINE SODIUM 75 MCG PO TABS: 75.0000 ug | ORAL_TABLET | Freq: Every day | ORAL | 1 refills | Status: DC

## 2016-10-20 MED ORDER — ENALAPRIL-HYDROCHLOROTHIAZIDE 10-25 MG PO TABS: 1.0000 | ORAL_TABLET | Freq: Every day | ORAL | 1 refills | Status: DC

## 2016-10-20 NOTE — Addendum Note (Signed)
Addended by: Chevis Pretty on: 10/20/2016 09:00 AM   Modules accepted: Orders

## 2016-10-20 NOTE — Progress Notes (Signed)
Subjective:    Patient ID: Janice Zamora, female    DOB: [birth year], 81 y.o.   MRN: [id number]  HPI Calinda Brayboy is here today for follow up of chronic medical problem.  Outpatient Encounter Prescriptions as of 10/20/2016  Medication Sig  . atenolol (TENORMIN) 50 MG tablet Take 1 tablet (50 mg total) by mouth daily.  Marland Kitchen conjugated estrogens (PREMARIN) vaginal cream Use 1/2 gm 2 x weekly  . enalapril-hydrochlorothiazide (VASERETIC) 10-25 MG tablet Take 1 tablet by mouth daily.  Marland Kitchen levothyroxine (SYNTHROID) 75 MCG tablet Take 1 tablet (75 mcg total) by mouth daily before breakfast.  . LORazepam (ATIVAN) 0.5 MG tablet TAKE ONE TABLET BY MOUTH TWICE DAILY AS NEEDED FOR ANXIETY  . Multiple Vitamin (MULTIVITAMIN WITH MINERALS) TABS tablet Take 1 tablet by mouth daily.  Marland Kitchen omeprazole (PRILOSEC) 20 MG capsule Take 1 capsule (20 mg total) by mouth daily.  . simvastatin (ZOCOR) 40 MG tablet Take 1 tablet (40 mg total) by mouth at bedtime.   No facility-administered encounter medications on file as of 10/20/2016.     1. Essential hypertension  Patient well-managed with atenolol and combination Vaseretic.  Patient does not check blood pressure at home.  2. Gastroesophageal reflux disease without esophagitis  Symptoms managed with omeprazole daily.  3. Hypothyroidism due to acquired atrophy of thyroid  Patient taking levothyroxine.  Labs monitored regularly.  4. Osteopenia, unspecified location  Patient taking a daily multivitamin and participates in weight-bearing exercise.  5. Mixed hyperlipidemia  Managed with simvastatin.  LFTs monitored regularly.  6. GAD (generalized anxiety disorder)  Managed with lorazepam up to BID as needed.  7. Overweight (BMI 25.0-29.9)  No significant weight gain or loss.    New complaints: Patient having some slight vaginal bleeding described as spotting.saw specialist last year for vaginal discharge but they did not do biopsy.  Social  history:    Review of Systems  Constitutional: Negative for activity change, appetite change and fatigue.  Respiratory: Negative for cough and shortness of breath.   Cardiovascular: Negative for chest pain and palpitations.  Gastrointestinal: Negative for abdominal pain, diarrhea, nausea and vomiting.  Genitourinary: Positive for vaginal bleeding (spotting).  Neurological: Negative for dizziness and headaches.  All other systems reviewed and are negative.      Objective:   Physical Exam  Constitutional: She is oriented to person, place, and time. She appears well-developed and well-nourished. No distress.  HENT:  Head: Normocephalic.  Right Ear: External ear normal.  Left Ear: External ear normal.  Mouth/Throat: Oropharynx is clear and moist.  Eyes: Pupils are equal, round, and reactive to light.  Neck: Normal range of motion. Neck supple. No thyromegaly present.  Cardiovascular: Normal rate, regular rhythm, normal heart sounds and intact distal pulses.   No murmur heard. Pulmonary/Chest: Effort normal and breath sounds normal. No respiratory distress. She has no wheezes.  Abdominal: Soft. Bowel sounds are normal. She exhibits no distension. There is no tenderness.  Musculoskeletal: Normal range of motion. She exhibits no edema.  Lymphadenopathy:    She has no cervical adenopathy.  Neurological: She is alert and oriented to person, place, and time.  Skin: Skin is warm and dry.  Psychiatric: She has a normal mood and affect. Her behavior is normal.   BP 139/64   Pulse 60   Temp (!) 96.9 F (36.1 C) (Oral)   Ht '5\' 2"'$  (1.575 m)   Wt 157 lb (71.2 kg)   BMI 28.72 kg/m     Assessment &  Plan:  1. Essential hypertension Low sodium diet - atenolol (TENORMIN) 50 MG tablet; Take 1 tablet (50 mg total) by mouth daily.  Dispense: 90 tablet; Refill: 1 - enalapril-hydrochlorothiazide (VASERETIC) 10-25 MG tablet; Take 1 tablet by mouth daily.  Dispense: 90 tablet; Refill: 1 -  CMP14+EGFR  2. Gastroesophageal reflux disease without esophagitis Avoid spicy foods Do not eat 2 hours prior to bedtime - omeprazole (PRILOSEC) 20 MG capsule; Take 1 capsule (20 mg total) by mouth daily.  Dispense: 90 capsule; Refill: 1  3. Hypothyroidism due to acquired atrophy of thyroid - levothyroxine (SYNTHROID) 75 MCG tablet; Take 1 tablet (75 mcg total) by mouth daily before breakfast.  Dispense: 90 tablet; Refill: 1  4. Osteopenia, unspecified location Weight bearing exercise  5. Mixed hyperlipidemia Low fat diet - Lipid panel  6. GAD (generalized anxiety disorder) Stress managemenyt - LORazepam (ATIVAN) 0.5 MG tablet; Take 1 tablet (0.5 mg total) by mouth 2 (two) times daily as needed. for anxiety  Dispense: 30 tablet; Refill: 2  7. Overweight (BMI 25.0-29.9) Discussed diet and exercise for person with BMI >25 Will recheck weight in 3-6 months  8. Spotting Referral to specialist - Ambulatory referral to Gynecology  9. Hyperlipidemia, unspecified hyperlipidemia type Low fat diet - simvastatin (ZOCOR) 40 MG tablet; Take 1 tablet (40 mg total) by mouth at bedtime.  Dispense: 90 tablet; Refill: 1    Labs pending Health maintenance reviewed Diet and exercise encouraged Continue all meds Follow up  In 3 months   [place], FNP

## 2016-10-20 NOTE — Patient Instructions (Signed)
Postmenopausal Bleeding Postmenopausal bleeding is any bleeding after menopause. Menopause is when a woman's period stops. Any type of bleeding after menopause is concerning. It should be checked by your doctor. Any treatment will depend on the cause. Follow these instructions at home: Watch your condition for any changes.  Avoid the use of tampons and douches as told by your doctor.  Change your pads often.  Get regular pelvic exams and Pap tests.  Keep all appointments for tests as told by your doctor.  Contact a doctor if:  Your bleeding lasts for more than 1 week.  You have belly (abdominal) pain.  You have bleeding after sex (intercourse). Get help right away if:  You have a fever, chills, a headache, dizziness, muscle aches, and bleeding.  You have strong pain with bleeding.  You have clumps of blood (blood clots) coming from your vagina.  You have bleeding and need more than 1 pad an hour.  You feel like you are going to pass out (faint). This information is not intended to replace advice given to you by your health care provider. Make sure you discuss any questions you have with your health care provider. Document Released: 10/27/2007 Document Revised: 06/25/2015 Document Reviewed: 08/16/2012 Elsevier Interactive Patient Education  2017 Elsevier Inc.  

## 2016-10-21 ENCOUNTER — Other Ambulatory Visit: Payer: Self-pay | Admitting: Nurse Practitioner

## 2016-10-21 DIAGNOSIS — R739 Hyperglycemia, unspecified: Secondary | ICD-10-CM

## 2016-10-24 DIAGNOSIS — R739 Hyperglycemia, unspecified: Secondary | ICD-10-CM | POA: Diagnosis not present

## 2016-10-24 LAB — BAYER DCA HB A1C WAIVED: HB A1C (BAYER DCA - WAIVED): 6 % (ref <7.0)

## 2016-10-24 NOTE — Addendum Note (Signed)
Addended by: Earlene Plater on: 10/24/2016 10:00 AM   Modules accepted: Orders

## 2016-11-11 ENCOUNTER — Encounter: Payer: Self-pay | Admitting: Adult Health

## 2016-11-11 ENCOUNTER — Ambulatory Visit (INDEPENDENT_AMBULATORY_CARE_PROVIDER_SITE_OTHER): Payer: PPO | Admitting: Adult Health

## 2016-11-11 VITALS — BP 142/80 | HR 68 | Ht 62.0 in | Wt 158.0 lb

## 2016-11-11 DIAGNOSIS — N950 Postmenopausal bleeding: Secondary | ICD-10-CM

## 2016-11-11 DIAGNOSIS — N952 Postmenopausal atrophic vaginitis: Secondary | ICD-10-CM

## 2016-11-11 DIAGNOSIS — R319 Hematuria, unspecified: Secondary | ICD-10-CM

## 2016-11-11 DIAGNOSIS — N898 Other specified noninflammatory disorders of vagina: Secondary | ICD-10-CM | POA: Diagnosis not present

## 2016-11-11 LAB — POCT URINALYSIS DIPSTICK
Glucose, UA: NEGATIVE
Ketones, UA: NEGATIVE
Nitrite, UA: NEGATIVE

## 2016-11-11 MED ORDER — ESTROGENS, CONJUGATED 0.625 MG/GM VA CREA: TOPICAL_CREAM | VAGINAL | 0 refills | Status: DC

## 2016-11-11 NOTE — Progress Notes (Signed)
Subjective:     Patient ID: Janice Zamora, female   DOB: July 07, 1935, 81 y.o.   MRN: [id number]  HPI Janice Zamora is a 81 year old white female, in complaining of wiping pink 10/19/16 and 11/02/16 and vaginal discharge, no itching like when she was seen in January of this year and given PVC, which she stopped using. Back achy at times too, like when was getting a period.Janice Zamora with her today, and can talk results with her, per pt. PCP is [place].   Review of Systems +wiping pink +vaginal discharge Back achy at times, like when had periods  Reviewed past medical,surgical, social and family history. Reviewed medications and allergies.     Objective:   Physical Exam BP (!) 142/80 (BP Location: Left Arm, Patient Position: Sitting, Cuff Size: Normal)   Pulse 68   Ht 5\' 2"  (1.575 m)   Wt 158 lb (71.7 kg)   BMI 28.90 kg/m urine 2+leuks and blood and trace protein,Skin warm and dry.Pelvic: external genitalia is normal in appearance no lesions, vagina: tissues thin and dry,no discharge or pink today,urethra has no lesions or masses noted, cervix:smooth and atrophic, uterus: normal size, shape and contour, non tender, no masses felt, adnexa: no masses or tenderness noted. Bladder is non tender and no masses felt.    Will send urine for UA C&S to rule out UTI and will get [place] to assess uterus and ovaries.  Assessment:     1. PMB (postmenopausal bleeding)   2. Vaginal discharge   3. Vaginal atrophy   4. Hematuria, unspecified type       Plan:    LOT # [id number] exp 11/19 Meds ordered this encounter  Medications  . conjugated estrogens (PREMARIN) vaginal cream    Sig: Use about.5 gm 2 x weekly    Dispense:  16 g    Refill:  0    Order Specific Question:   Supervising Provider    Answer:   Elonda Husky, LUTHER H [[id number]]  UA C&S sent Return in about a week for GYN [place] Then see me about 2 weeks after [place] Will talk when urine results back

## 2016-11-11 NOTE — Patient Instructions (Signed)
[place] in 1 week See me in 3 weeks  We will talk when urine back

## 2016-11-12 LAB — MICROSCOPIC EXAMINATION: Casts: NONE SEEN /lpf

## 2016-11-12 LAB — URINALYSIS, ROUTINE W REFLEX MICROSCOPIC
Bilirubin, UA: NEGATIVE
Glucose, UA: NEGATIVE
Nitrite, UA: NEGATIVE
PH UA: 5.5 (ref 5.0–7.5)
RBC, UA: NEGATIVE
Specific Gravity, UA: 1.027 (ref 1.005–1.030)
Urobilinogen, Ur: 1 mg/dL (ref 0.2–1.0)

## 2016-11-13 LAB — URINE CULTURE: ORGANISM ID, BACTERIA: NO GROWTH

## 2016-11-15 ENCOUNTER — Telehealth: Payer: Self-pay | Admitting: Adult Health

## 2016-11-15 NOTE — Telephone Encounter (Signed)
Pt aware urine culture negative and no blood in urine

## 2016-11-16 ENCOUNTER — Ambulatory Visit (INDEPENDENT_AMBULATORY_CARE_PROVIDER_SITE_OTHER): Payer: PPO

## 2016-11-16 DIAGNOSIS — Z23 Encounter for immunization: Secondary | ICD-10-CM | POA: Diagnosis not present

## 2016-11-22 ENCOUNTER — Ambulatory Visit (INDEPENDENT_AMBULATORY_CARE_PROVIDER_SITE_OTHER): Payer: PPO

## 2016-11-22 DIAGNOSIS — N950 Postmenopausal bleeding: Secondary | ICD-10-CM

## 2016-11-22 NOTE — Progress Notes (Signed)
PELVIC US TA/TV: atrophic heterogeneous uterus w/mult.calcification,thickened endometrium 8.8 mm,normal ovaries bilat,unable to slide ovaries,no pain during ultrasound,no free fluid

## 2016-11-23 ENCOUNTER — Telehealth: Payer: Self-pay | Admitting: Adult Health

## 2016-11-23 DIAGNOSIS — N950 Postmenopausal bleeding: Secondary | ICD-10-CM

## 2016-11-23 NOTE — Telephone Encounter (Signed)
Janice Zamora aware that Moms US showed thickened endometrium 8.8 mm, and will need endometrial bx, will schedule with Dr Miquel Dunn left mesasge for [name] to call be back.

## 2016-12-06 ENCOUNTER — Ambulatory Visit: Payer: PPO | Admitting: Adult Health

## 2016-12-08 ENCOUNTER — Other Ambulatory Visit: Payer: Self-pay | Admitting: Obstetrics & Gynecology

## 2016-12-08 ENCOUNTER — Ambulatory Visit: Payer: PPO | Admitting: Obstetrics & Gynecology

## 2016-12-08 ENCOUNTER — Encounter: Payer: Self-pay | Admitting: Obstetrics & Gynecology

## 2016-12-08 VITALS — BP 142/60 | HR 86 | Ht 62.0 in | Wt 158.5 lb

## 2016-12-08 DIAGNOSIS — R9389 Abnormal findings on diagnostic imaging of other specified body structures: Secondary | ICD-10-CM

## 2016-12-08 DIAGNOSIS — N950 Postmenopausal bleeding: Secondary | ICD-10-CM | POA: Diagnosis not present

## 2016-12-08 DIAGNOSIS — N858 Other specified noninflammatory disorders of uterus: Secondary | ICD-10-CM | POA: Diagnosis not present

## 2016-12-08 NOTE — Progress Notes (Signed)
Endometrial Biopsy Procedure Note  Pre-operative Diagnosis: Post menopausal bleeding                                              Thickened endometrium 8.8 mm  Post-operative Diagnosis: same  Indications: postmenopausal bleeding  Procedure Details   Urine pregnancy test was not done.  The risks (including infection, bleeding, pain, and uterine perforation) and benefits of the procedure were explained to the patient and Written informed consent was obtained.  Antibiotic prophylaxis against endocarditis was not indicated.   The patient was placed in the dorsal lithotomy position.  Bimanual exam showed the uterus to be in the anteroflexed position.  A Graves' speculum inserted in the vagina, and the cervix prepped with povidone iodine.  Endocervical curettage with a Kevorkian curette was not performed.   A sharp tenaculum was applied to the anterior lip of the cervix for stabilization.  A sterile uterine sound was used to sound the uterus to a depth of 5.5cm.  A Pipelle endometrial aspirator was used to sample the endometrium.  Sample was sent for pathologic examination.  Condition: Stable  Complications: None  Plan:  The patient was advised to call for any fever or for prolonged or severe pain or bleeding. She was advised to use OTC ibuprofen as needed for mild to moderate pain. She was advised to avoid vaginal intercourse for 48 hours or until the bleeding has completely stopped.  Attending Physician Documentation: I was present for or performed the following: endometrial biopsy

## 2016-12-08 NOTE — Addendum Note (Signed)
Addended by: Linton Rump on: 12/08/2016 04:07 PM   Modules accepted: Orders

## 2016-12-13 ENCOUNTER — Other Ambulatory Visit: Payer: Self-pay | Admitting: Nurse Practitioner

## 2016-12-13 DIAGNOSIS — Z1231 Encounter for screening mammogram for malignant neoplasm of breast: Secondary | ICD-10-CM

## 2016-12-15 ENCOUNTER — Telehealth: Payer: Self-pay | Admitting: *Deleted

## 2016-12-15 NOTE — Telephone Encounter (Signed)
Pt requesting results from her last visit.

## 2016-12-16 ENCOUNTER — Encounter: Payer: Self-pay | Admitting: Obstetrics & Gynecology

## 2017-01-20 ENCOUNTER — Ambulatory Visit: Payer: PPO | Admitting: Nurse Practitioner

## 2017-01-20 ENCOUNTER — Encounter: Payer: Self-pay | Admitting: Nurse Practitioner

## 2017-01-20 VITALS — BP 122/56 | HR 62 | Temp 96.8°F | Ht 62.0 in | Wt 155.0 lb

## 2017-01-20 DIAGNOSIS — E663 Overweight: Secondary | ICD-10-CM

## 2017-01-20 DIAGNOSIS — E785 Hyperlipidemia, unspecified: Secondary | ICD-10-CM

## 2017-01-20 DIAGNOSIS — E8881 Metabolic syndrome: Secondary | ICD-10-CM | POA: Diagnosis not present

## 2017-01-20 DIAGNOSIS — M8580 Other specified disorders of bone density and structure, unspecified site: Secondary | ICD-10-CM | POA: Diagnosis not present

## 2017-01-20 DIAGNOSIS — F411 Generalized anxiety disorder: Secondary | ICD-10-CM | POA: Diagnosis not present

## 2017-01-20 DIAGNOSIS — E782 Mixed hyperlipidemia: Secondary | ICD-10-CM

## 2017-01-20 DIAGNOSIS — E034 Atrophy of thyroid (acquired): Secondary | ICD-10-CM

## 2017-01-20 DIAGNOSIS — R739 Hyperglycemia, unspecified: Secondary | ICD-10-CM | POA: Diagnosis not present

## 2017-01-20 DIAGNOSIS — K219 Gastro-esophageal reflux disease without esophagitis: Secondary | ICD-10-CM

## 2017-01-20 DIAGNOSIS — I10 Essential (primary) hypertension: Secondary | ICD-10-CM | POA: Diagnosis not present

## 2017-01-20 MED ORDER — LEVOTHYROXINE SODIUM 75 MCG PO TABS: 75.0000 ug | ORAL_TABLET | Freq: Every day | ORAL | 1 refills | Status: DC

## 2017-01-20 MED ORDER — SIMVASTATIN 40 MG PO TABS: 40.0000 mg | ORAL_TABLET | Freq: Every day | ORAL | 1 refills | Status: DC

## 2017-01-20 MED ORDER — OMEPRAZOLE 20 MG PO CPDR: 20.0000 mg | DELAYED_RELEASE_CAPSULE | Freq: Every day | ORAL | 1 refills | Status: DC

## 2017-01-20 MED ORDER — ENALAPRIL-HYDROCHLOROTHIAZIDE 10-25 MG PO TABS: 1.0000 | ORAL_TABLET | Freq: Every day | ORAL | 1 refills | Status: DC

## 2017-01-20 MED ORDER — ATENOLOL 50 MG PO TABS: 50.0000 mg | ORAL_TABLET | Freq: Every day | ORAL | 1 refills | Status: DC

## 2017-01-20 NOTE — Progress Notes (Signed)
Subjective:    Patient ID: Janice Zamora, female    DOB: [birth year], 81 y.o.   MRN: [id number]  HPI  Janice Zamora is here today for follow up of chronic medical problem.  Outpatient Encounter Medications as of 01/20/2017  Medication Sig  . atenolol (TENORMIN) 50 MG tablet Take 1 tablet (50 mg total) by mouth daily.  Marland Kitchen conjugated estrogens (PREMARIN) vaginal cream Use about.5 gm 2 x weekly  . enalapril-hydrochlorothiazide (VASERETIC) 10-25 MG tablet Take 1 tablet by mouth daily.  Marland Kitchen levothyroxine (SYNTHROID) 75 MCG tablet Take 1 tablet (75 mcg total) by mouth daily before breakfast.  . LORazepam (ATIVAN) 0.5 MG tablet Take 1 tablet (0.5 mg total) by mouth 2 (two) times daily as needed. for anxiety  . Multiple Vitamin (MULTIVITAMIN WITH MINERALS) TABS tablet Take 1 tablet by mouth daily.  Marland Kitchen omeprazole (PRILOSEC) 20 MG capsule Take 1 capsule (20 mg total) by mouth daily.  . simvastatin (ZOCOR) 40 MG tablet Take 1 tablet (40 mg total) by mouth at bedtime.     1. Essential hypertension  No c/o chest pain, sob or headache. Does not check blood pressures at home. BP Readings from Last 3 Encounters:  12/08/16 (!) 142/60  11/11/16 (!) 142/80  10/20/16 139/64     2. Gastroesophageal reflux disease without esophagitis  Takes omeprazole daily- says she has bad reflux symptoms when does not take.  3. Hypothyroidism due to acquired atrophy of thyroid  No problems  4. Mixed hyperlipidemia  Does not watch diet  5. GAD (generalized anxiety disorder) Takes ativan about 1x daily   6. Overweight (BMI 25.0-29.9)    No recent weight changes  7. Metabolic syndrome  Does not check blood sugars at home  8. Osteopenia, unspecified location  No weight bearing exercises. No c/o back pain    New complaints: none todat  Social history: Lives alone    Review of Systems  Constitutional: Negative for activity change and appetite change.  HENT: Negative.   Eyes: Negative for pain.    Respiratory: Negative for shortness of breath.   Cardiovascular: Negative for chest pain, palpitations and leg swelling.  Gastrointestinal: Negative for abdominal pain.  Endocrine: Negative for polydipsia.  Genitourinary: Negative.   Skin: Negative for rash.  Neurological: Negative for dizziness, weakness and headaches.  Hematological: Does not bruise/bleed easily.  Psychiatric/Behavioral: Negative.   All other systems reviewed and are negative.      Objective:   Physical Exam  Constitutional: She is oriented to person, place, and time. She appears well-developed and well-nourished.  HENT:  Nose: Nose normal.  Mouth/Throat: Oropharynx is clear and moist.  Eyes: EOM are normal.  Neck: Trachea normal, normal range of motion and full passive range of motion without pain. Neck supple. No JVD present. Carotid bruit is not present. No thyromegaly present.  Cardiovascular: Normal rate, regular rhythm, normal heart sounds and intact distal pulses. Exam reveals no gallop and no friction rub.  No murmur heard. Pulmonary/Chest: Effort normal and breath sounds normal.  Abdominal: Soft. Bowel sounds are normal. She exhibits no distension and no mass. There is no tenderness.  Musculoskeletal: Normal range of motion.  Lymphadenopathy:    She has no cervical adenopathy.  Neurological: She is alert and oriented to person, place, and time. She has normal reflexes.  Skin: Skin is warm and dry.  Psychiatric: She has a normal mood and affect. Her behavior is normal. Judgment and thought content normal.    BP (!) 122/56  Pulse 62   Temp (!) 96.8 F (36 C) (Oral)   Ht _0  (1.575 m)   Wt 155 lb (70.3 kg)   BMI 28.35 kg/m       Assessment & Plan:  1. Essential hypertension Low sodium diet - atenolol (TENORMIN) 50 MG tablet; Take 1 tablet (50 mg total) by mouth daily.  Dispense: 90 tablet; Refill: 1 - enalapril-hydrochlorothiazide (VASERETIC) 10-25 MG tablet; Take 1 tablet by mouth daily.   Dispense: 90 tablet; Refill: 1 - CMP14+EGFR  2. Gastroesophageal reflux disease without esophagitis Avoid spicy foods Do not eat 2 hours prior to bedtime - omeprazole (PRILOSEC) 20 MG capsule; Take 1 capsule (20 mg total) by mouth daily.  Dispense: 90 capsule; Refill: 1  3. Hypothyroidism due to acquired atrophy of thyroid - levothyroxine (SYNTHROID) 75 MCG tablet; Take 1 tablet (75 mcg total) by mouth daily before breakfast.  Dispense: 90 tablet; Refill: 1  4. Mixed hyperlipidemia - simvastatin (ZOCOR) 40 MG tablet; Take 1 tablet (40 mg total) by mouth at bedtime.  Dispense: 90 tablet; Refill: 1 - Lipid panel  5. GAD (generalized anxiety disorder) Stress management  6. Overweight (BMI 25.0-29.9) Discussed diet and exercise for person with BMI >25 Will recheck weight in 3-6 months  7. Metabolic syndrome Watch carbsin diet  8. Osteopenia, unspecified location Weight bearing exercises      Labs pending Health maintenance reviewed Diet and exercise encouraged Continue all meds Follow up  In 3 months   [place], FNP

## 2017-01-20 NOTE — Patient Instructions (Signed)

## 2017-01-21 LAB — CMP14+EGFR
A/G RATIO: 1.6 (ref 1.2–2.2)
ALBUMIN: 4.1 g/dL (ref 3.5–4.8)
ALT: 10 IU/L (ref 0–32)
AST: 13 IU/L (ref 0–40)
Alkaline Phosphatase: 69 IU/L (ref 39–117)
BILIRUBIN TOTAL: 0.5 mg/dL (ref 0.0–1.2)
BUN / CREAT RATIO: 15 (ref 12–28)
BUN: 14 mg/dL (ref 8–27)
CHLORIDE: 95 mmol/L — AB (ref 96–106)
CO2: 25 mmol/L (ref 20–29)
Calcium: 9.3 mg/dL (ref 8.7–10.3)
Creatinine, Ser: 0.91 mg/dL (ref 0.57–1.00)
GFR calc non Af Amer: 60 mL/min/{1.73_m2} (ref >59)
GFR, EST AFRICAN AMERICAN: 69 mL/min/{1.73_m2} (ref >59)
GLOBULIN, TOTAL: 2.6 g/dL (ref 1.5–4.5)
Glucose: 117 mg/dL — ABNORMAL HIGH (ref 65–99)
POTASSIUM: 3.6 mmol/L (ref 3.5–5.2)
Sodium: 139 mmol/L (ref 134–144)
TOTAL PROTEIN: 6.7 g/dL (ref 6.0–8.5)

## 2017-01-21 LAB — LIPID PANEL
Chol/HDL Ratio: 3.6 ratio (ref 0.0–4.4)
Cholesterol, Total: 135 mg/dL (ref 100–199)
HDL: 37 mg/dL — ABNORMAL LOW (ref >39)
LDL Calculated: 67 mg/dL (ref 0–99)
Triglycerides: 157 mg/dL — ABNORMAL HIGH (ref 0–149)
VLDL Cholesterol Cal: 31 mg/dL (ref 5–40)

## 2017-01-26 ENCOUNTER — Other Ambulatory Visit: Payer: Self-pay | Admitting: Nurse Practitioner

## 2017-01-26 DIAGNOSIS — R739 Hyperglycemia, unspecified: Secondary | ICD-10-CM

## 2017-01-27 ENCOUNTER — Ambulatory Visit
Admission: RE | Admit: 2017-01-27 | Discharge: 2017-01-27 | Disposition: A | Discharge status: Home / Self Care | Payer: Medicare Other | Source: Ambulatory Visit | Attending: Nurse Practitioner | Admitting: Nurse Practitioner

## 2017-01-27 DIAGNOSIS — Z1231 Encounter for screening mammogram for malignant neoplasm of breast: Secondary | ICD-10-CM | POA: Diagnosis not present

## 2017-01-27 DIAGNOSIS — R739 Hyperglycemia, unspecified: Secondary | ICD-10-CM | POA: Diagnosis not present

## 2017-01-27 LAB — BAYER DCA HB A1C WAIVED: HB A1C (BAYER DCA - WAIVED): 6.5 % (ref <7.0)

## 2017-01-27 NOTE — Addendum Note (Signed)
Addended by: Liliane Bade on: 01/27/2017 08:31 AM   Modules accepted: Orders

## 2017-02-08 DIAGNOSIS — H5203 Hypermetropia, bilateral: Secondary | ICD-10-CM | POA: Diagnosis not present

## 2017-02-08 DIAGNOSIS — H524 Presbyopia: Secondary | ICD-10-CM | POA: Diagnosis not present

## 2017-02-08 DIAGNOSIS — H43393 Other vitreous opacities, bilateral: Secondary | ICD-10-CM | POA: Diagnosis not present

## 2017-02-08 DIAGNOSIS — H2513 Age-related nuclear cataract, bilateral: Secondary | ICD-10-CM | POA: Diagnosis not present

## 2017-02-08 DIAGNOSIS — H43813 Vitreous degeneration, bilateral: Secondary | ICD-10-CM | POA: Diagnosis not present

## 2017-03-16 ENCOUNTER — Ambulatory Visit: Payer: PPO

## 2017-04-05 ENCOUNTER — Telehealth: Payer: Self-pay | Admitting: Nurse Practitioner

## 2017-04-05 NOTE — Telephone Encounter (Signed)
Pt having vaginal itching and burning Wants RX Please review and advise

## 2017-04-06 MED ORDER — FLUCONAZOLE 150 MG PO TABS: 150.0000 mg | ORAL_TABLET | Freq: Once | ORAL | 0 refills | Status: AC

## 2017-04-06 NOTE — Telephone Encounter (Signed)
Patient aware that diflucan has been sent to pharmacy

## 2017-04-06 NOTE — Telephone Encounter (Signed)
difucan rx sent to pharmacy

## 2017-04-21 ENCOUNTER — Telehealth: Payer: Self-pay

## 2017-04-21 ENCOUNTER — Ambulatory Visit (INDEPENDENT_AMBULATORY_CARE_PROVIDER_SITE_OTHER): Payer: PPO | Admitting: Nurse Practitioner

## 2017-04-21 ENCOUNTER — Encounter: Payer: Self-pay | Admitting: Nurse Practitioner

## 2017-04-21 VITALS — BP 125/60 | HR 56 | Temp 96.8°F | Ht 62.0 in | Wt 161.0 lb

## 2017-04-21 DIAGNOSIS — E034 Atrophy of thyroid (acquired): Secondary | ICD-10-CM

## 2017-04-21 DIAGNOSIS — K219 Gastro-esophageal reflux disease without esophagitis: Secondary | ICD-10-CM | POA: Diagnosis not present

## 2017-04-21 DIAGNOSIS — E8881 Metabolic syndrome: Secondary | ICD-10-CM | POA: Diagnosis not present

## 2017-04-21 DIAGNOSIS — E663 Overweight: Secondary | ICD-10-CM

## 2017-04-21 DIAGNOSIS — E782 Mixed hyperlipidemia: Secondary | ICD-10-CM | POA: Diagnosis not present

## 2017-04-21 DIAGNOSIS — I10 Essential (primary) hypertension: Secondary | ICD-10-CM

## 2017-04-21 DIAGNOSIS — N904 Leukoplakia of vulva: Secondary | ICD-10-CM | POA: Diagnosis not present

## 2017-04-21 DIAGNOSIS — F411 Generalized anxiety disorder: Secondary | ICD-10-CM

## 2017-04-21 DIAGNOSIS — M8580 Other specified disorders of bone density and structure, unspecified site: Secondary | ICD-10-CM | POA: Diagnosis not present

## 2017-04-21 LAB — BAYER DCA HB A1C WAIVED: HB A1C (BAYER DCA - WAIVED): 5.9 % (ref <7.0)

## 2017-04-21 MED ORDER — SIMVASTATIN 40 MG PO TABS: 40.0000 mg | ORAL_TABLET | Freq: Every day | ORAL | 1 refills | Status: DC

## 2017-04-21 MED ORDER — LEVOTHYROXINE SODIUM 75 MCG PO TABS: 75.0000 ug | ORAL_TABLET | Freq: Every day | ORAL | 1 refills | Status: DC

## 2017-04-21 MED ORDER — ENALAPRIL-HYDROCHLOROTHIAZIDE 10-25 MG PO TABS: 1.0000 | ORAL_TABLET | Freq: Every day | ORAL | 1 refills | Status: DC

## 2017-04-21 MED ORDER — CLOBETASOL PROPIONATE 0.05 % EX CREA: TOPICAL_CREAM | CUTANEOUS | 0 refills | Status: DC

## 2017-04-21 MED ORDER — LORAZEPAM 0.5 MG PO TABS: 0.5000 mg | ORAL_TABLET | Freq: Two times a day (BID) | ORAL | 2 refills | Status: DC | PRN

## 2017-04-21 MED ORDER — TRIAMCINOLONE ACETONIDE 0.1 % EX CREA: TOPICAL_CREAM | CUTANEOUS | 2 refills | Status: DC

## 2017-04-21 MED ORDER — OMEPRAZOLE 20 MG PO CPDR: 20.0000 mg | DELAYED_RELEASE_CAPSULE | Freq: Every day | ORAL | 1 refills | Status: DC

## 2017-04-21 MED ORDER — ATENOLOL 50 MG PO TABS: 50.0000 mg | ORAL_TABLET | Freq: Every day | ORAL | 1 refills | Status: DC

## 2017-04-21 NOTE — Telephone Encounter (Signed)
Daughter states that the clobetasol cream is $90 with insurance - requesting something else sent to the pharmacy. Please advise

## 2017-04-21 NOTE — Telephone Encounter (Signed)
Different cream sen to pharmacy

## 2017-04-21 NOTE — Progress Notes (Signed)
Subjective:    Patient ID: Janice Zamora, female    DOB: [birth year], 82 y.o.   MRN: [id number]  HPI   Koi Claybrook is here today for follow up of chronic medical problem.  Outpatient Encounter Medications as of 04/21/2017  Medication Sig  . atenolol (TENORMIN) 50 MG tablet Take 1 tablet (50 mg total) by mouth daily.  Marland Kitchen conjugated estrogens (PREMARIN) vaginal cream Use about.5 gm 2 x weekly (Patient not taking: Reported on 01/20/2017)  . enalapril-hydrochlorothiazide (VASERETIC) 10-25 MG tablet Take 1 tablet by mouth daily.  Marland Kitchen levothyroxine (SYNTHROID) 75 MCG tablet Take 1 tablet (75 mcg total) by mouth daily before breakfast.  . LORazepam (ATIVAN) 0.5 MG tablet Take 1 tablet (0.5 mg total) by mouth 2 (two) times daily as needed. for anxiety  . Multiple Vitamin (MULTIVITAMIN WITH MINERALS) TABS tablet Take 1 tablet by mouth daily. (Patient not taking: Reported on 01/20/2017)  . omeprazole (PRILOSEC) 20 MG capsule Take 1 capsule (20 mg total) by mouth daily.  . simvastatin (ZOCOR) 40 MG tablet Take 1 tablet (40 mg total) by mouth at bedtime.     1. Essential hypertension  No c/o chest pain, sob or headache. Does not check blood pressure at home. BP Readings from Last 3 Encounters:  01/20/17 (!) 122/56  12/08/16 (!) 142/60  11/11/16 (!) 142/80     2. Gastroesophageal reflux disease without esophagitis  Currently on omeprazole but does ot take everyday. Says she takes around 3-4x a week  3. Hypothyroidism due to acquired atrophy of thyroid  Not having any problems that she is aware of  4. Osteopenia, unspecified location  Last dexascan as 01/11/16. t score -1.0. She does very little exercise  5. GAD (generalized anxiety disorder)  Takes ativan only as needed. Maybe 3-4 days a week  6. Mixed hyperlipidemia  Does not watch diet at all  7. Metabolic syndrome  Does not check blood sugar. Last hgba1c was 6.5%  8. Overweight (BMI 25.0-29.9)  No weight cahnages    New  complaints: Patient was having some perineal itching and I called her in a diflucan and that did not help, nor did nystatin cream she denies discharge.   Social history: Live alone   Review of Systems  Constitutional: Negative for activity change and appetite change.  HENT: Negative.   Eyes: Negative for pain.  Respiratory: Negative for shortness of breath.   Cardiovascular: Negative for chest pain, palpitations and leg swelling.  Gastrointestinal: Negative for abdominal pain.  Endocrine: Negative for polydipsia.  Genitourinary: Negative.   Skin: Negative for rash.  Neurological: Negative for dizziness, weakness and headaches.  Hematological: Does not bruise/bleed easily.  Psychiatric/Behavioral: Negative.   All other systems reviewed and are negative.      Objective:   Physical Exam  Constitutional: She is oriented to person, place, and time. She appears well-developed and well-nourished.  HENT:  Nose: Nose normal.  Mouth/Throat: Oropharynx is clear and moist.  Eyes: EOM are normal.  Neck: Trachea normal, normal range of motion and full passive range of motion without pain. Neck supple. No JVD present. Carotid bruit is not present. No thyromegaly present.  Cardiovascular: Normal rate, regular rhythm, normal heart sounds and intact distal pulses. Exam reveals no gallop and no friction rub.  No murmur heard. Pulmonary/Chest: Effort normal and breath sounds normal.  Abdominal: Soft. Bowel sounds are normal. She exhibits no distension and no mass. There is no tenderness.  Genitourinary:  Genitourinary Comments: Thicken white appearing skin ti  vulva  Musculoskeletal: Normal range of motion.  Lymphadenopathy:    She has no cervical adenopathy.  Neurological: She is alert and oriented to person, place, and time. She has normal reflexes.  Skin: Skin is warm and dry.  Psychiatric: She has a normal mood and affect. Her behavior is normal. Judgment and thought content normal.   BP  125/60   Pulse (!) 56   Temp (!) 96.8 F (36 C) (Oral)   Ht '5\' 2"'$  (1.575 m)   Wt 161 lb (73 kg)   BMI 29.45 kg/m    HGBA1c 5.9%     Assessment & Plan:  1. Essential hypertension Low sodium diet - CMP14+EGFR - atenolol (TENORMIN) 50 MG tablet; Take 1 tablet (50 mg total) by mouth daily.  Dispense: 90 tablet; Refill: 1 - enalapril-hydrochlorothiazide (VASERETIC) 10-25 MG tablet; Take 1 tablet by mouth daily.  Dispense: 90 tablet; Refill: 1  2. Gastroesophageal reflux disease without esophagitis Avoid spicy foods Do not eat 2 hours prior to bedtime - omeprazole (PRILOSEC) 20 MG capsule; Take 1 capsule (20 mg total) by mouth daily.  Dispense: 90 capsule; Refill: 1  3. Hypothyroidism due to acquired atrophy of thyroid - levothyroxine (SYNTHROID) 75 MCG tablet; Take 1 tablet (75 mcg total) by mouth daily before breakfast.  Dispense: 90 tablet; Refill: 1  4. Osteopenia, unspecified location Weight bearing exercises  5. GAD (generalized anxiety disorder) Stress management - LORazepam (ATIVAN) 0.5 MG tablet; Take 1 tablet (0.5 mg total) by mouth 2 (two) times daily as needed. for anxiety  Dispense: 30 tablet; Refill: 2  6. Mixed hyperlipidemia Low fat diet - Lipid panel - simvastatin (ZOCOR) 40 MG tablet; Take 1 tablet (40 mg total) by mouth at bedtime.  Dispense: 90 tablet; Refill: 1  7. Metabolic syndrome Watch carbs in diet - Bayer DCA Hb A1c Waived  8. Overweight (BMI 25.0-29.9) Discussed diet and exercise for person with BMI >25 Will recheck weight in 3-6 months  9. Lichen sclerosus et atrophicus of the vulva Clobetasol cream daily x1 week the every other day      Labs pending Health maintenance reviewed Diet and exercise encouraged Continue all meds Follow up  In 3 months   [place], FNP

## 2017-04-21 NOTE — Addendum Note (Signed)
Addended by: Chevis Pretty on: 04/21/2017 01:38 PM   Modules accepted: Orders

## 2017-04-21 NOTE — Patient Instructions (Signed)
Lichen Sclerosus Lichen sclerosus is a skin problem. It can happen on any part of the body. It happens most often in the anal or genital areas. It can cause itching and discomfort. Treatment can help to control symptoms. This skin problem is not passed from one person to another (not contagious). The cause is not known. Follow these instructions at home:  Take over-the-counter and prescription medicines only as told by your doctor.  Use creams or ointments as told by your doctor.  Do not scratch the affected areas of skin.  Women should keep the vagina as clean and dry as they can.  Keep all follow-up visits as told by your doctor. This is important. Contact a doctor if:  Your redness, swelling, or pain gets worse.  You have fluid, blood, or pus coming from the area.  You have new patches (lesions) on your skin.  You have a fever.  You have pain during sex. This information is not intended to replace advice given to you by your health care provider. Make sure you discuss any questions you have with your health care provider. Document Released: 12/31/2007 Document Revised: 06/25/2015 Document Reviewed: 04/14/2014 Elsevier Interactive Patient Education  2018 Elsevier Inc.  

## 2017-04-22 LAB — CMP14+EGFR
A/G RATIO: 1.9 (ref 1.2–2.2)
ALT: 17 IU/L (ref 0–32)
AST: 16 IU/L (ref 0–40)
Albumin: 4.4 g/dL (ref 3.5–4.7)
Alkaline Phosphatase: 59 IU/L (ref 39–117)
BILIRUBIN TOTAL: 0.3 mg/dL (ref 0.0–1.2)
BUN/Creatinine Ratio: 18 (ref 12–28)
BUN: 17 mg/dL (ref 8–27)
CALCIUM: 9.7 mg/dL (ref 8.7–10.3)
CHLORIDE: 98 mmol/L (ref 96–106)
CO2: 25 mmol/L (ref 20–29)
Creatinine, Ser: 0.92 mg/dL (ref 0.57–1.00)
GFR calc non Af Amer: 59 mL/min/{1.73_m2} — ABNORMAL LOW (ref >59)
GFR, EST AFRICAN AMERICAN: 68 mL/min/{1.73_m2} (ref >59)
GLUCOSE: 117 mg/dL — AB (ref 65–99)
Globulin, Total: 2.3 g/dL (ref 1.5–4.5)
POTASSIUM: 3.6 mmol/L (ref 3.5–5.2)
Sodium: 141 mmol/L (ref 134–144)
TOTAL PROTEIN: 6.7 g/dL (ref 6.0–8.5)

## 2017-04-22 LAB — LIPID PANEL
Chol/HDL Ratio: 3.6 ratio (ref 0.0–4.4)
Cholesterol, Total: 146 mg/dL (ref 100–199)
HDL: 41 mg/dL (ref >39)
LDL Calculated: 73 mg/dL (ref 0–99)
TRIGLYCERIDES: 162 mg/dL — AB (ref 0–149)
VLDL CHOLESTEROL CAL: 32 mg/dL (ref 5–40)

## 2017-04-26 NOTE — Telephone Encounter (Signed)
Left detailed message.   

## 2017-06-09 ENCOUNTER — Encounter: Payer: Self-pay | Admitting: Internal Medicine

## 2017-07-24 ENCOUNTER — Encounter: Payer: Self-pay | Admitting: Nurse Practitioner

## 2017-07-24 ENCOUNTER — Ambulatory Visit (INDEPENDENT_AMBULATORY_CARE_PROVIDER_SITE_OTHER): Payer: PPO | Admitting: Nurse Practitioner

## 2017-07-24 VITALS — BP 130/63 | HR 60 | Temp 96.8°F | Ht 62.0 in | Wt 164.0 lb

## 2017-07-24 DIAGNOSIS — E034 Atrophy of thyroid (acquired): Secondary | ICD-10-CM | POA: Diagnosis not present

## 2017-07-24 DIAGNOSIS — M8580 Other specified disorders of bone density and structure, unspecified site: Secondary | ICD-10-CM

## 2017-07-24 DIAGNOSIS — E8881 Metabolic syndrome: Secondary | ICD-10-CM

## 2017-07-24 DIAGNOSIS — E782 Mixed hyperlipidemia: Secondary | ICD-10-CM

## 2017-07-24 DIAGNOSIS — E669 Obesity, unspecified: Secondary | ICD-10-CM | POA: Diagnosis not present

## 2017-07-24 DIAGNOSIS — I10 Essential (primary) hypertension: Secondary | ICD-10-CM | POA: Diagnosis not present

## 2017-07-24 DIAGNOSIS — F411 Generalized anxiety disorder: Secondary | ICD-10-CM | POA: Diagnosis not present

## 2017-07-24 DIAGNOSIS — K219 Gastro-esophageal reflux disease without esophagitis: Secondary | ICD-10-CM

## 2017-07-24 MED ORDER — ENALAPRIL-HYDROCHLOROTHIAZIDE 10-25 MG PO TABS: 1.0000 | ORAL_TABLET | Freq: Every day | ORAL | 1 refills | Status: DC

## 2017-07-24 MED ORDER — OMEPRAZOLE 20 MG PO CPDR: 20.0000 mg | DELAYED_RELEASE_CAPSULE | Freq: Every day | ORAL | 1 refills | Status: DC

## 2017-07-24 MED ORDER — ATENOLOL 50 MG PO TABS: 50.0000 mg | ORAL_TABLET | Freq: Every day | ORAL | 1 refills | Status: DC

## 2017-07-24 MED ORDER — LORAZEPAM 0.5 MG PO TABS: 0.5000 mg | ORAL_TABLET | Freq: Two times a day (BID) | ORAL | 2 refills | Status: DC | PRN

## 2017-07-24 MED ORDER — SIMVASTATIN 40 MG PO TABS: 40.0000 mg | ORAL_TABLET | Freq: Every day | ORAL | 1 refills | Status: DC

## 2017-07-24 MED ORDER — LEVOTHYROXINE SODIUM 75 MCG PO TABS: 75.0000 ug | ORAL_TABLET | Freq: Every day | ORAL | 1 refills | Status: DC

## 2017-07-24 NOTE — Patient Instructions (Signed)
Bone Health Bones protect organs, store calcium, and anchor muscles. Good health habits, such as eating nutritious foods and exercising regularly, are important for maintaining healthy bones. They can also help to prevent a condition that causes bones to lose density and become weak and brittle (osteoporosis). Why is bone mass important? Bone mass refers to the amount of bone tissue that you have. The higher your bone mass, the stronger your bones. An important step toward having healthy bones throughout life is to have strong and dense bones during childhood. A young adult who has a high bone mass is more likely to have a high bone mass later in life. Bone mass at its greatest it is called peak bone mass. A large decline in bone mass occurs in older adults. In women, it occurs about the time of menopause. During this time, it is important to practice good health habits, because if more bone is lost than what is replaced, the bones will become less healthy and more likely to break (fracture). If you find that you have a low bone mass, you may be able to prevent osteoporosis or further bone loss by changing your diet and lifestyle. How can I find out if my bone mass is low? Bone mass can be measured with an X-ray test that is called a bone mineral density (BMD) test. This test is recommended for all women who are age 65 or older. It may also be recommended for men who are age 70 or older, or for people who are more likely to develop osteoporosis due to:  Having bones that break easily.  Having a long-term disease that weakens bones, such as kidney disease or rheumatoid arthritis.  Having menopause earlier than normal.  Taking medicine that weakens bones, such as steroids, thyroid hormones, or hormone treatment for breast cancer or prostate cancer.  Smoking.  Drinking three or more alcoholic drinks each day.  What are the nutritional recommendations for healthy bones? To have healthy bones, you  need to get enough of the right minerals and vitamins. Most nutrition experts recommend getting these nutrients from the foods that you eat. Nutritional recommendations vary from person to person. Ask your health care provider what is healthy for you. Here are some general guidelines. Calcium Recommendations Calcium is the most important (essential) mineral for bone health. Most people can get enough calcium from their diet, but supplements may be recommended for people who are at risk for osteoporosis. Good sources of calcium include:  Dairy products, such as low-fat or nonfat milk, cheese, and yogurt.  Dark green leafy vegetables, such as bok choy and broccoli.  Calcium-fortified foods, such as orange juice, cereal, bread, soy beverages, and tofu products.  Nuts, such as almonds.  Follow these recommended amounts for daily calcium intake:  Children, age 1?3: 700 mg.  Children, age 4?8: 1,000 mg.  Children, age 9?13: 1,300 mg.  Teens, age 14?18: 1,300 mg.  Adults, age 19?50: 1,000 mg.  Adults, age 51?70: ? Men: 1,000 mg. ? Women: 1,200 mg.  Adults, age 71 or older: 1,200 mg.  Pregnant and breastfeeding females: ? Teens: 1,300 mg. ? Adults: 1,000 mg.  Vitamin D Recommendations Vitamin D is the most essential vitamin for bone health. It helps the body to absorb calcium. Sunlight stimulates the skin to make vitamin D, so be sure to get enough sunlight. If you live in a cold climate or you do not get outside often, your health care provider may recommend that you take vitamin   D supplements. Good sources of vitamin D in your diet include:  Egg yolks.  Saltwater fish.  Milk and cereal fortified with vitamin D.  Follow these recommended amounts for daily vitamin D intake:  Children and teens, age 1?18: 600 international units.  Adults, age 50 or younger: 400-800 international units.  Adults, age 51 or older: 800-1,000 international units.  Other Nutrients Other nutrients  for bone health include:  Phosphorus. This mineral is found in meat, poultry, dairy foods, nuts, and legumes. The recommended daily intake for adult men and adult women is 700 mg.  Magnesium. This mineral is found in seeds, nuts, dark green vegetables, and legumes. The recommended daily intake for adult men is 400?420 mg. For adult women, it is 310?320 mg.  Vitamin K. This vitamin is found in green leafy vegetables. The recommended daily intake is 120 mg for adult men and 90 mg for adult women.  What type of physical activity is best for building and maintaining healthy bones? Weight-bearing and strength-building activities are important for building and maintaining peak bone mass. Weight-bearing activities cause muscles and bones to work against gravity. Strength-building activities increases muscle strength that supports bones. Weight-bearing and muscle-building activities include:  Walking and hiking.  Jogging and running.  Dancing.  Gym exercises.  Lifting weights.  Tennis and racquetball.  Climbing stairs.  Aerobics.  Adults should get at least 30 minutes of moderate physical activity on most days. Children should get at least 60 minutes of moderate physical activity on most days. Ask your health care provide what type of exercise is best for you. Where can I find more information? For more information, check out the following websites:  National Osteoporosis Foundation: http://nof.org/learn/basics  National Institutes of Health: http://www.niams.nih.gov/Health_Info/Bone/Bone_Health/bone_health_for_life.asp  This information is not intended to replace advice given to you by your health care provider. Make sure you discuss any questions you have with your health care provider. Document Released: 04/09/2003 Document Revised: 08/07/2015 Document Reviewed: 01/22/2014 Elsevier Interactive Patient Education  2018 Elsevier Inc.  

## 2017-07-24 NOTE — Addendum Note (Signed)
Addended by: Chevis Pretty on: 07/24/2017 08:29 AM   Modules accepted: Orders

## 2017-07-24 NOTE — Progress Notes (Signed)
Subjective:    Patient ID: Janice Zamora, female    DOB: 03/02/1935, 82 y.o.   MRN: [id number]   Chief Complaint: Medical management of chronic issues  HPI:  1. Essential hypertension  No c/o chest pain, sob or headache. Doe snot check blood pressure at home. BP Readings from Last 3 Encounters:  04/21/17 125/60  01/20/17 (!) 122/56  12/08/16 (!) 142/60     2. Gastroesophageal reflux disease without esophagitis  Takes omrprazole daily to keep symptoms under control.  3. Hypothyroidism due to acquired atrophy of thyroid  No problems that she is aware of.  4. Osteopenia, unspecified location  Last Dexascan was 01/11/16 with t score of -1.0  5. Overweight (BMI 29.0-29.9)  Weight gradually increasing.  6. Mixed hyperlipidemia  Does not watch diet very closely  7. GAD (generalized anxiety disorder)  Takes ativan every evening to calm herself down. She gets very anxious in theevenings,  8. Metabolic syndrome  Does not check blood sugars at home. Last HGBA1c was 5.9%    Outpatient Encounter Medications as of 07/24/2017  Medication Sig  . atenolol (TENORMIN) 50 MG tablet Take 1 tablet (50 mg total) by mouth daily.  . enalapril-hydrochlorothiazide (VASERETIC) 10-25 MG tablet Take 1 tablet by mouth daily.  Marland Kitchen levothyroxine (SYNTHROID) 75 MCG tablet Take 1 tablet (75 mcg total) by mouth daily before breakfast.  . LORazepam (ATIVAN) 0.5 MG tablet Take 1 tablet (0.5 mg total) by mouth 2 (two) times daily as needed. for anxiety  . Multiple Vitamin (MULTIVITAMIN WITH MINERALS) TABS tablet Take 1 tablet by mouth daily.  Marland Kitchen omeprazole (PRILOSEC) 20 MG capsule Take 1 capsule (20 mg total) by mouth daily.  . simvastatin (ZOCOR) 40 MG tablet Take 1 tablet (40 mg total) by mouth at bedtime.  . triamcinolone cream (KENALOG) 0.1 % Apply to affected area daily for 1 week the QOD       New complaints: None today  Social history: Patient lives by herself. She [place] the beach  with hr daughters and their families.    Review of Systems  Constitutional: Negative for activity change and appetite change.  HENT: Negative.   Eyes: Negative for pain.  Respiratory: Negative for shortness of breath.   Cardiovascular: Negative for chest pain, palpitations and leg swelling.  Gastrointestinal: Negative for abdominal pain.  Endocrine: Negative for polydipsia.  Genitourinary: Negative.   Skin: Negative for rash.  Neurological: Negative for dizziness, weakness and headaches.  Hematological: Does not bruise/bleed easily.  Psychiatric/Behavioral: Negative.   All other systems reviewed and are negative.      Objective:   Physical Exam  Constitutional: She is oriented to person, place, and time.  HENT:  Head: Normocephalic.  Nose: Nose normal.  Mouth/Throat: Oropharynx is clear and moist.  Eyes: Pupils are equal, round, and reactive to light. EOM are normal.  Neck: Normal range of motion. Neck supple. No JVD present. Carotid bruit is not present.  Cardiovascular: Normal rate, regular rhythm, normal heart sounds and intact distal pulses.  Pulmonary/Chest: Effort normal and breath sounds normal. No respiratory distress. She has no wheezes. She has no rales. She exhibits no tenderness.  Abdominal: Soft. Normal appearance, normal aorta and bowel sounds are normal. She exhibits no distension, no abdominal bruit, no pulsatile midline mass and no mass. There is no splenomegaly or hepatomegaly. There is no tenderness.  Musculoskeletal: Normal range of motion. She exhibits no edema.  Lymphadenopathy:    She has no cervical adenopathy.  Neurological: She is alert and oriented to person, place, and time. She has normal reflexes.  Skin: Skin is warm and dry.  Psychiatric: She has a normal mood and affect. Her behavior is normal. Judgment and thought content normal.      BP 130/63   Pulse 60   Temp (!) 96.8 F (36 C) (Oral)   Ht 5\' 2"  (1.575 m)   Wt 164 lb (74.4 kg)   BMI  30.00 kg/m      Assessment & Plan:  Melissia Zamora comes in today with chief complaint of Medical Management of Chronic Issues   Diagnosis and orders addressed:  1. Essential hypertension Low sodium diet - atenolol (TENORMIN) 50 MG tablet; Take 1 tablet (50 mg total) by mouth daily.  Dispense: 90 tablet; Refill: 1 - enalapril-hydrochlorothiazide (VASERETIC) 10-25 MG tablet; Take 1 tablet by mouth daily.  Dispense: 90 tablet; Refill: 1  2. Gastroesophageal reflux disease without esophagitis Avoid spicy foods Do not eat 2 hours prior to bedtime - omeprazole (PRILOSEC) 20 MG capsule; Take 1 capsule (20 mg total) by mouth daily.  Dispense: 90 capsule; Refill: 1  3. Hypothyroidism due to acquired atrophy of thyroid - levothyroxine (SYNTHROID) 75 MCG tablet; Take 1 tablet (75 mcg total) by mouth daily before breakfast.  Dispense: 90 tablet; Refill: 1  4. Osteopenia, unspecified location Weight bearing exercises encouraged  5. Obesity (BMI 30-39.9) Discussed diet and exercise for person with BMI >25 Will recheck weight in 3-6 months   6. Mixed hyperlipidemia Low fat diet - simvastatin (ZOCOR) 40 MG tablet; Take 1 tablet (40 mg total) by mouth at bedtime.  Dispense: 90 tablet; Refill: 1  7. GAD (generalized anxiety disorder) Stress management - LORazepam (ATIVAN) 0.5 MG tablet; Take 1 tablet (0.5 mg total) by mouth 2 (two) times daily as needed. for anxiety  Dispense: 30 tablet; Refill: 2  8. Metabolic syndrome Watch carbsin diet   Labs pending Health Maintenance reviewed Diet and exercise encouraged  Follow up plan: 3 months   Janice Zamora Done, FNP

## 2017-07-25 LAB — LIPID PANEL
CHOL/HDL RATIO: 3.6 ratio (ref 0.0–4.4)
Cholesterol, Total: 138 mg/dL (ref 100–199)
HDL: 38 mg/dL — AB (ref >39)
LDL Calculated: 63 mg/dL (ref 0–99)
Triglycerides: 186 mg/dL — ABNORMAL HIGH (ref 0–149)
VLDL CHOLESTEROL CAL: 37 mg/dL (ref 5–40)

## 2017-07-25 LAB — CMP14+EGFR
A/G RATIO: 1.9 (ref 1.2–2.2)
ALT: 13 IU/L (ref 0–32)
AST: 14 IU/L (ref 0–40)
Albumin: 4.3 g/dL (ref 3.5–4.7)
Alkaline Phosphatase: 64 IU/L (ref 39–117)
BILIRUBIN TOTAL: 0.4 mg/dL (ref 0.0–1.2)
BUN / CREAT RATIO: 24 (ref 12–28)
BUN: 24 mg/dL (ref 8–27)
CO2: 23 mmol/L (ref 20–29)
Calcium: 10.1 mg/dL (ref 8.7–10.3)
Chloride: 102 mmol/L (ref 96–106)
Creatinine, Ser: 1.02 mg/dL — ABNORMAL HIGH (ref 0.57–1.00)
GFR, EST AFRICAN AMERICAN: 60 mL/min/{1.73_m2} (ref >59)
GFR, EST NON AFRICAN AMERICAN: 52 mL/min/{1.73_m2} — AB (ref >59)
GLUCOSE: 137 mg/dL — AB (ref 65–99)
Globulin, Total: 2.3 g/dL (ref 1.5–4.5)
POTASSIUM: 4.1 mmol/L (ref 3.5–5.2)
Sodium: 139 mmol/L (ref 134–144)
Total Protein: 6.6 g/dL (ref 6.0–8.5)

## 2017-07-25 LAB — THYROID PANEL WITH TSH
FREE THYROXINE INDEX: 2.6 (ref 1.2–4.9)
T3 Uptake Ratio: 29 % (ref 24–39)
T4 TOTAL: 9 ug/dL (ref 4.5–12.0)
TSH: 0.423 u[IU]/mL — AB (ref 0.450–4.500)

## 2017-08-10 ENCOUNTER — Ambulatory Visit (INDEPENDENT_AMBULATORY_CARE_PROVIDER_SITE_OTHER): Payer: PPO | Admitting: *Deleted

## 2017-08-10 VITALS — BP 123/60 | HR 62 | Temp 98.2°F | Ht 61.5 in | Wt 160.6 lb

## 2017-08-10 DIAGNOSIS — Z0000 Encounter for general adult medical examination without abnormal findings: Secondary | ICD-10-CM | POA: Diagnosis not present

## 2017-08-10 NOTE — Progress Notes (Addendum)
Subjective:   Janice Zamora is a 82 y.o. female who presents for Medicare Annual (Subsequent) preventive examination. Janice Zamora lives at home by herself. She has adult 2 daughters and 2 sons. She has 3 living grandchildren and 2 great grandchildren. She continues to do her own house work and yard work. She enjoys getting outside and working in her yard. She mows, weedeats, and trims her bushes. She has some stairs in her home and is cautious when she has to go up and down them. She does not have any pets in the home. Fall hazards were discussed with patient   Review of Systems:   Cardiac Risk Factors include: advanced age (>66men, >25 women);hypertension;dyslipidemia  Patient states that her health is about the same as it was last year.     Objective:     BP 123/60   Pulse 62   Temp 98.2 F (36.8 C) (Oral)   Ht 5' 1.5" (1.562 m)   Wt 160 lb 9.6 oz (72.8 kg)   BMI 29.85 kg/m    Advanced Directives 08/10/2017 11/11/2016 01/19/2016 10/20/2014  Does Patient Have a Medical Advance Directive? No No No No  Does patient want to make changes to medical advance directive? - No - Patient declined - -  Would patient like information on creating a medical advance directive? Yes (MAU/Ambulatory/Procedural Areas - Information given) No - Patient declined No - Patient declined Yes - Scientist, clinical (histocompatibility and immunogenetics) given    Tobacco Social History   Tobacco Use  Smoking Status Never Smoker  Smokeless Tobacco Never Used     Counseling given: Not Answered   Clinical Intake:  Pain : No/denies pain  Nutritional Status: BMI 25 -29 Overweight Diabetes: No  How often do you need to have someone help you when you read instructions, pamphlets, or other written materials from your doctor or pharmacy?: 3 - Sometimes  Interpreter Needed?: No     Past Medical History:  Diagnosis Date  . Cataract   . Cholelithiasis   . Diverticulosis   . Fibrocystic breast disease   . GAD (generalized anxiety  disorder)   . GERD (gastroesophageal reflux disease)   . Hyperlipidemia   . Hypertension   . Hypothyroidism   . Metabolic syndrome   . Osteopenia    Past Surgical History:  Procedure Laterality Date  . BREAST SURGERY     fibriotic cyst removed  . CHOLECYSTECTOMY     Family History  Problem Relation Age of Onset  . Hypertension Mother   . Hypertension Father   . Hyperlipidemia Sister   . Hyperlipidemia Brother   . Hyperlipidemia Sister   . Hyperlipidemia Sister   . Healthy Daughter   . Healthy Son   . Healthy Daughter   . Healthy Son    Social History   Socioeconomic History  . Marital status: Widowed    Spouse name: Not on file  . Number of children: 4  . Years of education: Not on file  . Highest education level: Not on file  Occupational History  . Occupation: homemaker  Social Needs  . Financial resource strain: Not hard at all  . Food insecurity:    Worry: Never true    Inability: Never true  . Transportation needs:    Medical: No    Non-medical: No  Tobacco Use  . Smoking status: Never Smoker  . Smokeless tobacco: Never Used  Substance and Sexual Activity  . Alcohol use: No  . Drug use: No  . Sexual  activity: Never    Birth control/protection: Post-menopausal  Lifestyle  . Physical activity:    Days per week: 0 days    Minutes per session: 0 min  . Stress: Not at all  Relationships  . Social connections:    Talks on phone: More than three times a week    Gets together: More than three times a week    Attends religious service: More than 4 times per year    Active member of club or organization: No    Attends meetings of clubs or organizations: Never    Relationship status: Widowed  Other Topics Concern  . Not on file  Social History Narrative  . Not on file    Outpatient Encounter Medications as of 08/10/2017  Medication Sig  . atenolol (TENORMIN) 50 MG tablet Take 1 tablet (50 mg total) by mouth daily.  . enalapril-hydrochlorothiazide  (VASERETIC) 10-25 MG tablet Take 1 tablet by mouth daily.  Marland Kitchen levothyroxine (SYNTHROID) 75 MCG tablet Take 1 tablet (75 mcg total) by mouth daily before breakfast.  . LORazepam (ATIVAN) 0.5 MG tablet Take 1 tablet (0.5 mg total) by mouth 2 (two) times daily as needed. for anxiety  . Multiple Vitamin (MULTIVITAMIN WITH MINERALS) TABS tablet Take 1 tablet by mouth daily.  Marland Kitchen omeprazole (PRILOSEC) 20 MG capsule Take 1 capsule (20 mg total) by mouth daily.  . simvastatin (ZOCOR) 40 MG tablet Take 1 tablet (40 mg total) by mouth at bedtime.  . triamcinolone cream (KENALOG) 0.1 % Apply to affected area daily for 1 week the QOD   No facility-administered encounter medications on file as of 08/10/2017.     Activities of Daily Living In your present state of health, do you have any difficulty performing the following activities: 08/10/2017  Hearing? N  Vision? N  Comment Patient wears glasses  Difficulty concentrating or making decisions? N  Walking or climbing stairs? N  Dressing or bathing? N  Doing errands, shopping? N  Preparing Food and eating ? N  Using the Toilet? N  In the past six months, have you accidently leaked urine? N  Do you have problems with loss of bowel control? N  Managing your Medications? N  Managing your Finances? N  Housekeeping or managing your Housekeeping? N  Some recent data might be hidden    Patient Care Team: Chevis Pretty, FNP as PCP - General (Nurse Practitioner) Irene Shipper, MD as Consulting Physician (Gastroenterology) Melina Schools, OD as Consulting Physician (Optometry)    Assessment:   This is a routine wellness examination for Janice Zamora.  Exercise Activities and Dietary recommendations Current Exercise Habits: The patient does not participate in regular exercise at present  Goals    . Exercise 3x per week (30 min per time)     Walk for 30 minutes 3 time per week    . Exercise 3x per week (30 min per time)       Fall Risk Fall Risk   08/10/2017 07/24/2017 04/21/2017 01/20/2017 10/20/2016  Falls in the past year? No No No No No   Is the patient's home free of loose throw rugs in walkways, pet beds, electrical cords, etc?   no      Grab bars in the bathroom? no      Handrails on the stairs?   yes      Adequate lighting?   yes  Timed Get Up and Go performed:   Depression Screen [facility] 2/9 Scores 08/10/2017 07/24/2017 04/21/2017 01/20/2017  PHQ - 2 Score 0 0 0 0     Cognitive Function MMSE - Mini Mental State Exam 08/10/2017 10/28/2015 10/20/2014  Orientation to time 5 5 5   Orientation to Place 5 5 5   Registration 3 3 3   Attention/ Calculation 4 5 -  Recall 2 1 3   Language- name 2 objects 2 2 2   Language- repeat 1 1 1   Language- follow 3 step command 3 3 3   Language- read & follow direction 1 1 1   Write a sentence 1 1 1   Copy design 1 1 1   Total score 28 28 -        Immunization History  Administered Date(s) Administered  . Influenza, High Dose Seasonal PF 11/16/2016  . Influenza,inj,Quad PF,6+ Mos 11/02/2012, 10/29/2013, 11/11/2014, 10/22/2015  . Pneumococcal Conjugate-13 09/08/2014  . Pneumococcal Polysaccharide-23 06/01/2002  . Tdap 09/14/2015    Qualifies for Shingles Vaccine? Yes and patient states she will consider and let us know at her next visit.  Screening Tests Health Maintenance  Topic Date Due  . INFLUENZA VACCINE  08/31/2017  . DEXA SCAN  01/14/2018  . MAMMOGRAM  01/27/2018  . TETANUS/TDAP  09/13/2025  . PNA vac Low Risk Adult  Completed    Cancer Screenings: Lung: Low Dose CT Chest recommended if Age 15-80 years, 30 pack-year currently smoking OR have quit w/in 15years. Patient does not qualify. Breast:  Up to date on Mammogram? Yes   Up to date of Bone Density/Dexa? Yes Colorectal: Up to date   Additional Screenings: : Hepatitis C Screening:      Plan:  Patient will follow up with Ronnald Collum as planned. Patient will think about the shingles vaccine and let us know at her next  appointment Patient will contact Dr. Blanch Media office to see if he wants her to have colonoscopy  I have personally reviewed and noted the following in the patient's chart:   . Medical and social history . Use of alcohol, tobacco or illicit drugs  . Current medications and supplements . Functional ability and status . Nutritional status . Physical activity . Advanced directives . List of other physicians . Hospitalizations, surgeries, and ER visits in previous 12 months . Vitals . Screenings to include cognitive, depression, and falls . Referrals and appointments  In addition, I have reviewed and discussed with patient certain preventive protocols, quality metrics, and best practice recommendations. A written personalized care plan for preventive services as well as general preventive health recommendations were provided to patient.     Gareth Morgan, LPN  07/17/735  I have reviewed and agree with the above AWV documentation.   Mary-Margaret Hassell Done, FNP

## 2017-08-10 NOTE — Patient Instructions (Signed)
  Janice Zamora , Thank you for taking time to come for your Medicare Wellness Visit. I appreciate your ongoing commitment to your health goals. Please review the following plan we discussed and let me know if I can assist you in the future.   These are the goals we discussed: Goals    . Exercise 3x per week (30 min per time)     Walk for 30 minutes 3 time per week    . Exercise 3x per week (30 min per time)       This is a list of the screening recommended for you and due dates:  Health Maintenance  Topic Date Due  . Flu Shot  08/31/2017  . DEXA scan (bone density measurement)  01/14/2018  . Mammogram  01/27/2018  . Tetanus Vaccine  09/13/2025  . Pneumonia vaccines  Completed

## 2017-08-24 ENCOUNTER — Other Ambulatory Visit: Payer: Self-pay | Admitting: Nurse Practitioner

## 2017-08-24 DIAGNOSIS — F411 Generalized anxiety disorder: Secondary | ICD-10-CM

## 2017-08-24 NOTE — Telephone Encounter (Signed)
Last seen 07/24/17   MMM

## 2017-10-24 ENCOUNTER — Ambulatory Visit (INDEPENDENT_AMBULATORY_CARE_PROVIDER_SITE_OTHER): Payer: PPO | Admitting: Nurse Practitioner

## 2017-10-24 ENCOUNTER — Encounter: Payer: Self-pay | Admitting: Nurse Practitioner

## 2017-10-24 VITALS — BP 121/68 | HR 62 | Temp 97.0°F | Ht 61.5 in | Wt 162.8 lb

## 2017-10-24 DIAGNOSIS — E782 Mixed hyperlipidemia: Secondary | ICD-10-CM | POA: Diagnosis not present

## 2017-10-24 DIAGNOSIS — I10 Essential (primary) hypertension: Secondary | ICD-10-CM | POA: Diagnosis not present

## 2017-10-24 DIAGNOSIS — E8881 Metabolic syndrome: Secondary | ICD-10-CM | POA: Diagnosis not present

## 2017-10-24 DIAGNOSIS — F411 Generalized anxiety disorder: Secondary | ICD-10-CM | POA: Diagnosis not present

## 2017-10-24 DIAGNOSIS — E034 Atrophy of thyroid (acquired): Secondary | ICD-10-CM

## 2017-10-24 DIAGNOSIS — M8580 Other specified disorders of bone density and structure, unspecified site: Secondary | ICD-10-CM

## 2017-10-24 DIAGNOSIS — E663 Overweight: Secondary | ICD-10-CM | POA: Diagnosis not present

## 2017-10-24 DIAGNOSIS — K219 Gastro-esophageal reflux disease without esophagitis: Secondary | ICD-10-CM

## 2017-10-24 MED ORDER — LORAZEPAM 0.5 MG PO TABS: 0.5000 mg | ORAL_TABLET | Freq: Two times a day (BID) | ORAL | 2 refills | Status: DC | PRN

## 2017-10-24 MED ORDER — TRIAMCINOLONE ACETONIDE 0.1 % EX CREA: TOPICAL_CREAM | CUTANEOUS | 2 refills | Status: DC

## 2017-10-24 NOTE — Progress Notes (Signed)
Subjective:    Patient ID: Janice Zamora, female    DOB: March 20, 1935, 82 y.o.   MRN: [id number]   Chief Complaint: Hypertension (3 month follow up)   HPI:  1. Essential hypertension  -checks BP at home occasionally -no issues with BP meds -no HA/dizziness/CP -eat what she wants BP Readings from Last 3 Encounters:  10/24/17 121/68  08/10/17 123/60  07/24/17 130/63    2. GAD (generalized anxiety disorder)  -controlled -takes lorazepam 2-3 week generalized anxiety disorder   3. Gastroesophageal reflux disease without esophagitis  -takes Prilosec and has symptoms if she goes without it -does not watch what she eats   4. Hypothyroidism due to acquired atrophy of thyroid  -no issues with thyroid -no heat/cold intolerances   5. Mixed hyperlipidemia  -still takes Zocor, no myalgias -eat what she wants   6. Overweight (BMI 25.0-29.9)  -mow the yard -rake the leaves   7. Osteopenia, unspecified location  -no recent fractures -takes Multivitamin    Outpatient Encounter Medications as of 10/24/2017  Medication Sig  . atenolol (TENORMIN) 50 MG tablet Take 1 tablet (50 mg total) by mouth daily.  . enalapril-hydrochlorothiazide (VASERETIC) 10-25 MG tablet Take 1 tablet by mouth daily.  Marland Kitchen levothyroxine (SYNTHROID) 75 MCG tablet Take 1 tablet (75 mcg total) by mouth daily before breakfast.  . LORazepam (ATIVAN) 0.5 MG tablet TAKE ONE TABLET BY MOUTH TWICE DAILY AS NEEDED FOR ANXIETY  . Multiple Vitamin (MULTIVITAMIN WITH MINERALS) TABS tablet Take 1 tablet by mouth daily.  Marland Kitchen omeprazole (PRILOSEC) 20 MG capsule Take 1 capsule (20 mg total) by mouth daily.  . simvastatin (ZOCOR) 40 MG tablet Take 1 tablet (40 mg total) by mouth at bedtime.  . triamcinolone cream (KENALOG) 0.1 % Apply to affected area daily for 1 week the QOD   No facility-administered encounter medications on file as of 10/24/2017.     New complaints: No new complaints   Social history: -lives by  herself, grandson spends night with her once in awhile  Review of Systems  Constitutional: Negative for activity change and appetite change.  HENT: Positive for rhinorrhea (clear). Negative for ear discharge, ear pain and sinus pressure.   Eyes: Negative for pain and redness.  Respiratory: Negative for cough and shortness of breath.   Cardiovascular: Negative for chest pain and leg swelling.  Gastrointestinal: Negative for abdominal pain, constipation, diarrhea and nausea.  Endocrine: Negative for cold intolerance and heat intolerance.  Genitourinary: Negative for dysuria and urgency.  Musculoskeletal: Negative for arthralgias and myalgias.  Skin: Negative for rash and wound.  Allergic/Immunologic: Positive for environmental allergies. Negative for food allergies.  Neurological: Negative for dizziness, tremors, weakness and headaches.  Hematological: Does not bruise/bleed easily.  Psychiatric/Behavioral: Negative for sleep disturbance and suicidal ideas.       Objective:   Physical Exam  Constitutional: She is oriented to person, place, and time. She appears well-developed and well-nourished.  HENT:  Head: Normocephalic and atraumatic.  Right Ear: External ear normal.  Left Ear: External ear normal.  Nose: Nose normal.  Mouth/Throat: Oropharynx is clear and moist.  Eyes: Pupils are equal, round, and reactive to light. Conjunctivae are normal.  Neck: Normal range of motion. Neck supple. No thyromegaly present.  Cardiovascular: Normal rate, regular rhythm, normal heart sounds and intact distal pulses.  Pulmonary/Chest: Effort normal and breath sounds normal.  Abdominal: Soft. Bowel sounds are normal.  Musculoskeletal: Normal range of motion. She exhibits no edema.  Neurological: She is alert and  oriented to person, place, and time. She displays normal reflexes. No cranial nerve deficit.  Skin: Skin is warm and dry.  Psychiatric: She has a normal mood and affect. Her behavior is  normal. Judgment and thought content normal.  Nursing note and vitals reviewed.      BP 121/68   Pulse 62   Temp (!) 97 F (36.1 C) (Oral)   Ht 5' 1.5" (1.562 m)   Wt 162 lb 12.8 oz (73.8 kg)   BMI 30.26 kg/m   Assessment & Plan:  Keyera Mccart comes in today with chief complaint of Hypertension (3 month follow up)   Diagnosis and orders addressed:  1. GAD (generalized anxiety disorder) Stress management - LORazepam (ATIVAN) 0.5 MG tablet; Take 1 tablet (0.5 mg total) by mouth 2 (two) times daily as needed. for anxiety  Dispense: 30 tablet; Refill: 1  2. Essential hypertension Low sodium diet  3. Gastroesophageal reflux disease without esophagitis Avoid spicy foods Do not eat 2 hours prior to bedtime  4. Hypothyroidism due to acquired atrophy of thyroid  5. Mixed hyperlipidemia Low fat diet  6. Overweight (BMI 25.0-29.9) Discussed diet and exercise for person with BMI >25 Will recheck weight in 3-6 months  7. Osteopenia, unspecified location Weight bearing exrcises   Labs pending Health Maintenance reviewed Diet and exercise encouraged  Follow up plan: 3 months   Mary-Margaret Hassell Done, FNP

## 2017-10-24 NOTE — Addendum Note (Signed)
Addended by: Chevis Pretty on: 10/24/2017 03:50 PM   Modules accepted: Orders

## 2017-10-25 LAB — LIPID PANEL
CHOL/HDL RATIO: 3.6 ratio (ref 0.0–4.4)
Cholesterol, Total: 145 mg/dL (ref 100–199)
HDL: 40 mg/dL (ref >39)
LDL Calculated: 75 mg/dL (ref 0–99)
Triglycerides: 150 mg/dL — ABNORMAL HIGH (ref 0–149)
VLDL Cholesterol Cal: 30 mg/dL (ref 5–40)

## 2017-10-25 LAB — CMP14+EGFR
A/G RATIO: 2 (ref 1.2–2.2)
ALBUMIN: 4.4 g/dL (ref 3.5–4.7)
ALT: 15 IU/L (ref 0–32)
AST: 15 IU/L (ref 0–40)
Alkaline Phosphatase: 69 IU/L (ref 39–117)
BUN / CREAT RATIO: 24 (ref 12–28)
BUN: 23 mg/dL (ref 8–27)
Bilirubin Total: 0.3 mg/dL (ref 0.0–1.2)
CALCIUM: 9.6 mg/dL (ref 8.7–10.3)
CO2: 24 mmol/L (ref 20–29)
Chloride: 99 mmol/L (ref 96–106)
Creatinine, Ser: 0.96 mg/dL (ref 0.57–1.00)
GFR, EST AFRICAN AMERICAN: 65 mL/min/{1.73_m2} (ref >59)
GFR, EST NON AFRICAN AMERICAN: 56 mL/min/{1.73_m2} — AB (ref >59)
GLOBULIN, TOTAL: 2.2 g/dL (ref 1.5–4.5)
Glucose: 132 mg/dL — ABNORMAL HIGH (ref 65–99)
POTASSIUM: 4.1 mmol/L (ref 3.5–5.2)
Sodium: 141 mmol/L (ref 134–144)
TOTAL PROTEIN: 6.6 g/dL (ref 6.0–8.5)

## 2017-10-25 LAB — THYROID PANEL WITH TSH
Free Thyroxine Index: 2.5 (ref 1.2–4.9)
T3 Uptake Ratio: 27 % (ref 24–39)
T4 TOTAL: 9.1 ug/dL (ref 4.5–12.0)
TSH: 0.781 u[IU]/mL (ref 0.450–4.500)

## 2017-10-26 NOTE — Addendum Note (Signed)
Addended by: Chevis Pretty on: 10/26/2017 05:19 PM   Modules accepted: Orders

## 2017-10-30 DIAGNOSIS — E8881 Metabolic syndrome: Secondary | ICD-10-CM | POA: Diagnosis not present

## 2017-10-30 LAB — BAYER DCA HB A1C WAIVED: HB A1C (BAYER DCA - WAIVED): 6.2 % (ref <7.0)

## 2017-10-30 NOTE — Addendum Note (Signed)
Addended by: Liliane Bade on: 10/30/2017 01:46 PM   Modules accepted: Orders

## 2017-12-11 ENCOUNTER — Ambulatory Visit (INDEPENDENT_AMBULATORY_CARE_PROVIDER_SITE_OTHER): Payer: PPO

## 2017-12-11 DIAGNOSIS — Z23 Encounter for immunization: Secondary | ICD-10-CM

## 2018-02-05 ENCOUNTER — Encounter: Payer: Self-pay | Admitting: Nurse Practitioner

## 2018-02-05 ENCOUNTER — Ambulatory Visit (INDEPENDENT_AMBULATORY_CARE_PROVIDER_SITE_OTHER): Payer: PPO | Admitting: Nurse Practitioner

## 2018-02-05 VITALS — BP 127/63 | HR 65 | Temp 96.3°F | Ht 61.0 in | Wt 166.0 lb

## 2018-02-05 DIAGNOSIS — M8580 Other specified disorders of bone density and structure, unspecified site: Secondary | ICD-10-CM | POA: Diagnosis not present

## 2018-02-05 DIAGNOSIS — E782 Mixed hyperlipidemia: Secondary | ICD-10-CM

## 2018-02-05 DIAGNOSIS — E8881 Metabolic syndrome: Secondary | ICD-10-CM | POA: Diagnosis not present

## 2018-02-05 DIAGNOSIS — K219 Gastro-esophageal reflux disease without esophagitis: Secondary | ICD-10-CM

## 2018-02-05 DIAGNOSIS — E034 Atrophy of thyroid (acquired): Secondary | ICD-10-CM | POA: Diagnosis not present

## 2018-02-05 DIAGNOSIS — F411 Generalized anxiety disorder: Secondary | ICD-10-CM | POA: Diagnosis not present

## 2018-02-05 DIAGNOSIS — I10 Essential (primary) hypertension: Secondary | ICD-10-CM

## 2018-02-05 DIAGNOSIS — E663 Overweight: Secondary | ICD-10-CM | POA: Diagnosis not present

## 2018-02-05 DIAGNOSIS — R200 Anesthesia of skin: Secondary | ICD-10-CM

## 2018-02-05 MED ORDER — LEVOTHYROXINE SODIUM 75 MCG PO TABS: 75.0000 ug | ORAL_TABLET | Freq: Every day | ORAL | 1 refills | Status: DC

## 2018-02-05 MED ORDER — SIMVASTATIN 40 MG PO TABS: 40.0000 mg | ORAL_TABLET | Freq: Every day | ORAL | 1 refills | Status: DC

## 2018-02-05 MED ORDER — MELOXICAM 15 MG PO TABS: 15.0000 mg | ORAL_TABLET | Freq: Every day | ORAL | 0 refills | Status: DC

## 2018-02-05 MED ORDER — LORAZEPAM 0.5 MG PO TABS: 0.5000 mg | ORAL_TABLET | Freq: Two times a day (BID) | ORAL | 2 refills | Status: DC | PRN

## 2018-02-05 MED ORDER — ENALAPRIL-HYDROCHLOROTHIAZIDE 10-25 MG PO TABS: 1.0000 | ORAL_TABLET | Freq: Every day | ORAL | 1 refills | Status: DC

## 2018-02-05 MED ORDER — ATENOLOL 50 MG PO TABS: 50.0000 mg | ORAL_TABLET | Freq: Every day | ORAL | 1 refills | Status: DC

## 2018-02-05 MED ORDER — OMEPRAZOLE 20 MG PO CPDR: 20.0000 mg | DELAYED_RELEASE_CAPSULE | Freq: Every day | ORAL | 1 refills | Status: DC

## 2018-02-05 NOTE — Progress Notes (Signed)
Patient ID: Janice Zamora, female   DOB: 1936-01-30, 83 y.o.   MRN: [id number]   Subjective   Patient ID: Janice Zamora, female    DOB: [birth year], 83 y.o.   MRN: [id number]   Chief Complaint: Medical Management of Chronic Issues (numbness in left leg)   HPI:  1. Essential hypertension  No c/o chest pain, sob ir headaches. Does not chck blood pressure at home.    BP Readings from Last 3 Encounters:  02/05/18 127/63  10/24/17 121/68  08/10/17 123/60     2. Gastroesophageal reflux disease without esophagitis / Takes omeprazole dialy which works well to keep symptoms under control.  3. Hypothyroidism due to acquired atrophy of thyroid  No problems that she is aware of  4. Osteopenia, unspecified location  Last dexscan was in 11/2015. Her t score then was -1.0. she is suppose to be on daily vitamin d and calcium. She forgets to take it most days  5. Overweight (BMI 25.0-29.9)  Weight Is up 3lbs from last visit  6. Metabolic syndrome  She doe snot check blood sugars at home. Her last hgba1c was 6.2%  7. Mixed hyperlipidemia  Has really been eating a lot over the holidays. Does no exerxise  8. GAD (generalized anxiety disorder)  Takes ativan nightly in order to calm down so she can sleep        Outpatient Encounter Medications as of 02/05/2018  Medication Sig  . atenolol (TENORMIN) 50 MG tablet Take 1 tablet (50 mg total) by mouth daily.  . enalapril-hydrochlorothiazide (VASERETIC) 10-25 MG tablet Take 1 tablet by mouth daily.  Marland Kitchen levothyroxine (SYNTHROID) 75 MCG tablet Take 1 tablet (75 mcg total) by mouth daily before breakfast.  . LORazepam (ATIVAN) 0.5 MG tablet Take 1 tablet (0.5 mg total) by mouth 2 (two) times daily as needed. for anxiety  . Multiple Vitamin (MULTIVITAMIN WITH MINERALS) TABS tablet Take 1 tablet by mouth daily.  Marland Kitchen omeprazole (PRILOSEC) 20 MG capsule Take 1 capsule (20 mg total) by mouth daily.  . simvastatin (ZOCOR) 40 MG tablet Take 1 tablet (40  mg total) by mouth at bedtime.  . triamcinolone cream (KENALOG) 0.1 % Apply to affected area daily for 1 week the QOD     New complaints: She says she hurt her back getting up on new years day and she has numbness in lateral side of left leg. It is some better. Has no pain it is just numbness that is bothering her. Sh ehas been taking ibuprofen and "muscle cream".  Social history: Lives alone. Family checks on her daily   Review of Systems  Constitutional: Negative for activity change and appetite change.  HENT: Negative.   Eyes: Negative for pain.  Respiratory: Negative for shortness of breath.   Cardiovascular: Negative for chest pain, palpitations and leg swelling.  Gastrointestinal: Negative for abdominal pain.  Endocrine: Negative for polydipsia.  Genitourinary: Negative.   Musculoskeletal: Negative for back pain.  Skin: Negative for rash.  Neurological: Negative for dizziness, weakness and headaches.  Hematological: Does not bruise/bleed easily.  Psychiatric/Behavioral: Negative.   All other systems reviewed and are negative.      Objective:   Objective   Physical Exam Vitals signs and nursing note reviewed.  Constitutional:      General: She is not in acute distress.    Appearance: Normal appearance. She is well-developed.  HENT:     Head: Normocephalic.     Nose: Nose normal.  Eyes:     Pupils:  Pupils are equal, round, and reactive to light.  Neck:     Musculoskeletal: Normal range of motion and neck supple.     Vascular: No carotid bruit or JVD.  Cardiovascular:     Rate and Rhythm: Normal rate and regular rhythm.     Heart sounds: Normal heart sounds.  Pulmonary:     Effort: Pulmonary effort is normal. No respiratory distress.     Breath sounds: Normal breath sounds. No wheezing or rales.  Chest:     Chest wall: No tenderness.  Abdominal:     General: Bowel sounds are normal. There is no distension or abdominal bruit.     Palpations:  Abdomen is soft. There is no hepatomegaly, splenomegaly, mass or pulsatile mass.     Tenderness: There is no abdominal tenderness.  Musculoskeletal: Normal range of motion.     Comments: Gait slow and steady. (-) SLR bil Motor strength and sensation distally intact  Lymphadenopathy:     Cervical: No cervical adenopathy.  Skin:    General: Skin is warm and dry.  Neurological:     Mental Status: She is alert and oriented to person, place, and time.     Deep Tendon Reflexes: Reflexes are normal and symmetric.  Psychiatric:        Behavior: Behavior normal.        Thought Content: Thought content normal.        Judgment: Judgment normal.    BP 127/63   Pulse 65   Temp (!) 96.3 F (35.7 C) (Oral)   Ht _0  (1.549 m)   Wt 166 lb (75.3 kg)   BMI 31.37 kg/m         Assessment & Plan:  Janice Zamora comes in today with chief complaint of Medical Management of Chronic Issues (numbness in left leg)   Diagnosis and orders addressed:  1. Essential hypertension Low sodium diet - CMP14+EGFR - atenolol (TENORMIN) 50 MG tablet; Take 1 tablet (50 mg total) by mouth daily.  Dispense: 90 tablet; Refill: 1 - enalapril-hydrochlorothiazide (VASERETIC) 10-25 MG tablet; Take 1 tablet by mouth daily.  Dispense: 90 tablet; Refill: 1  2. Gastroesophageal reflux disease without esophagitis Avoid spicy foods Do not eat 2 hours prior to bedtime - omeprazole (PRILOSEC) 20 MG capsule; Take 1 capsule (20 mg total) by mouth daily.  Dispense: 90 capsule; Refill: 1  3. Hypothyroidism due to acquired atrophy of thyroid - Thyroid Panel With TSH - levothyroxine (SYNTHROID) 75 MCG tablet; Take 1 tablet (75 mcg total) by mouth daily before breakfast.  Dispense: 90 tablet; Refill: 1  4. Osteopenia, unspecified location Weight bearing exercises  5. Overweight (BMI 25.0-29.9) Discussed diet and exercise for person with BMI >25 Will recheck weight in 3-6 months  6. Metabolic syndrome  7.  Mixed hyperlipidemia Low fat diet - Lipid panel - simvastatin (ZOCOR) 40 MG tablet; Take 1 tablet (40 mg total) by mouth at bedtime.  Dispense: 90 tablet; Refill: 1  8. GAD (generalized anxiety disorder) Stress management - LORazepam (ATIVAN) 0.5 MG tablet; Take 1 tablet (0.5 mg total) by mouth 2 (two) times daily as needed. for anxiety  Dispense: 30 tablet; Refill: 2  9. Left leg numbness - meloxicam (MOBIC) 15 MG tablet; Take 1 tablet (15 mg total) by mouth daily.  Dispense: 30 tablet; Refill: 0   Labs pending Health Maintenance reviewed Diet and exercise encouraged  Follow up plan: 3 months   Mary-Margaret Hassell Done, FNP

## 2018-02-05 NOTE — Patient Instructions (Signed)
Sciatica    Sciatica is pain, numbness, weakness, or tingling along your sciatic nerve. The sciatic nerve starts in the lower back and goes down the back of each leg. Sciatica happens when this nerve is pinched or has pressure put on it. Sciatica usually goes away on its own or with treatment. Sometimes, sciatica may keep coming back (recur).  Follow these instructions at home:  Medicines  · Take over-the-counter and prescription medicines only as told by your doctor.  · Do not drive or use heavy machinery while taking prescription pain medicine.  Managing pain  · If directed, put ice on the affected area.  ? Put ice in a plastic bag.  ? Place a towel between your skin and the bag.  ? Leave the ice on for 20 minutes, 2-3 times a day.  · After icing, apply heat to the affected area before you exercise or as often as told by your doctor. Use the heat source that your doctor tells you to use, such as a moist heat pack or a heating pad.  ? Place a towel between your skin and the heat source.  ? Leave the heat on for 20-30 minutes.  ? Remove the heat if your skin turns bright red. This is especially important if you are unable to feel pain, heat, or cold. You may have a greater risk of getting burned.  Activity  · Return to your normal activities as told by your doctor. Ask your doctor what activities are safe for you.  ? Avoid activities that make your sciatica worse.  · Take short rests during the day. Rest in a lying or standing position. This is usually better than sitting to rest.  ? When you rest for a long time, do some physical activity or stretching between periods of rest.  ? Avoid sitting for a long time without moving. Get up and move around at least one time each hour.  · Exercise and stretch regularly, as told by your doctor.  · Do not lift anything that is heavier than 10 lb (4.5 kg) while you have symptoms of sciatica.  ? Avoid lifting heavy things even when you do not have symptoms.  ? Avoid lifting  heavy things over and over.  · When you lift objects, always lift in a way that is safe for your body. To do this, you should:  ? Bend your knees.  ? Keep the object close to your body.  ? Avoid twisting.  General instructions  · Use good posture.  ? Avoid leaning forward when you are sitting.  ? Avoid hunching over when you are standing.  · Stay at a healthy weight.  · Wear comfortable shoes that support your feet. Avoid wearing high heels.  · Avoid sleeping on a mattress that is too soft or too hard. You might have less pain if you sleep on a mattress that is firm enough to support your back.  · Keep all follow-up visits as told by your doctor. This is important.  Contact a doctor if:  · You have pain that:  ? Wakes you up when you are sleeping.  ? Gets worse when you lie down.  ? Is worse than the pain you have had in the past.  ? Lasts longer than 4 weeks.  · You lose weight for without trying.  Get help right away if:  · You cannot control when you pee (urinate) or poop (have a bowel movement).  · You   have weakness in any of these areas and it gets worse.  ? Lower back.  ? Lower belly (pelvis).  ? Butt (buttocks).  ? Legs.  · You have redness or swelling of your back.  · You have a burning feeling when you pee.  This information is not intended to replace advice given to you by your health care provider. Make sure you discuss any questions you have with your health care provider.  Document Released: 10/27/2007 Document Revised: 06/25/2015 Document Reviewed: 09/26/2014  Elsevier Interactive Patient Education © 2019 [employer]

## 2018-02-06 LAB — CMP14+EGFR
ALBUMIN: 4.4 g/dL (ref 3.5–4.7)
ALT: 17 IU/L (ref 0–32)
AST: 17 IU/L (ref 0–40)
Albumin/Globulin Ratio: 2 (ref 1.2–2.2)
Alkaline Phosphatase: 66 IU/L (ref 39–117)
BUN / CREAT RATIO: 21 (ref 12–28)
BUN: 21 mg/dL (ref 8–27)
Bilirubin Total: 0.4 mg/dL (ref 0.0–1.2)
CALCIUM: 9.3 mg/dL (ref 8.7–10.3)
CO2: 22 mmol/L (ref 20–29)
Chloride: 99 mmol/L (ref 96–106)
Creatinine, Ser: 0.99 mg/dL (ref 0.57–1.00)
GFR, EST AFRICAN AMERICAN: 62 mL/min/{1.73_m2} (ref >59)
GFR, EST NON AFRICAN AMERICAN: 54 mL/min/{1.73_m2} — AB (ref >59)
GLOBULIN, TOTAL: 2.2 g/dL (ref 1.5–4.5)
Glucose: 135 mg/dL — ABNORMAL HIGH (ref 65–99)
Potassium: 4 mmol/L (ref 3.5–5.2)
Sodium: 138 mmol/L (ref 134–144)
TOTAL PROTEIN: 6.6 g/dL (ref 6.0–8.5)

## 2018-02-06 LAB — LIPID PANEL
CHOL/HDL RATIO: 4.4 ratio (ref 0.0–4.4)
Cholesterol, Total: 163 mg/dL (ref 100–199)
HDL: 37 mg/dL — AB (ref >39)
LDL Calculated: 82 mg/dL (ref 0–99)
Triglycerides: 220 mg/dL — ABNORMAL HIGH (ref 0–149)
VLDL Cholesterol Cal: 44 mg/dL — ABNORMAL HIGH (ref 5–40)

## 2018-02-06 LAB — THYROID PANEL WITH TSH
FREE THYROXINE INDEX: 2.6 (ref 1.2–4.9)
T3 Uptake Ratio: 27 % (ref 24–39)
T4 TOTAL: 9.6 ug/dL (ref 4.5–12.0)
TSH: 2.92 u[IU]/mL (ref 0.450–4.500)

## 2018-02-09 ENCOUNTER — Other Ambulatory Visit: Payer: Self-pay | Admitting: Nurse Practitioner

## 2018-02-09 DIAGNOSIS — Z1231 Encounter for screening mammogram for malignant neoplasm of breast: Secondary | ICD-10-CM

## 2018-02-14 ENCOUNTER — Other Ambulatory Visit: Payer: Self-pay | Admitting: Nurse Practitioner

## 2018-02-14 NOTE — Telephone Encounter (Signed)
What is the name of the medication? Seen MMM 1-6 and MMM told her to call back and needs something else instead of Meloxicam and wants a generic  Have you contacted your pharmacy to request a refill? NO  Which pharmacy would you like this sent to? [facility] in [place]   Patient notified that their request is being sent to the clinical staff for review and that they should receive a call once it is complete. If they do not receive a call within 24 hours they can check with their pharmacy or our office.

## 2018-02-14 NOTE — Telephone Encounter (Signed)
Patient reports she is still experiencing back pain and pain in buttocks.  She is taking the Meloxicam as prescribed and using ice/heat.  Patient is aware you are out of the office and wants to wait until you return.  [facility]

## 2018-02-15 NOTE — Telephone Encounter (Signed)
She wants flexeril

## 2018-02-15 NOTE — Telephone Encounter (Signed)
That is all we can give hr other then pain mds and she as to be seen for yhat

## 2018-02-16 NOTE — Telephone Encounter (Signed)
Pt states she will just wait and see if it gets any better.

## 2018-02-16 NOTE — Telephone Encounter (Signed)
I do not want to do flexeril at her age because I am afrid it will make her fall- wuld sh elike referral to dr. Nelva Bush

## 2018-03-12 ENCOUNTER — Ambulatory Visit
Admission: RE | Admit: 2018-03-12 | Discharge: 2018-03-12 | Disposition: A | Discharge status: Home / Self Care | Payer: PPO | Source: Ambulatory Visit | Attending: Nurse Practitioner | Admitting: Nurse Practitioner

## 2018-03-12 DIAGNOSIS — Z1231 Encounter for screening mammogram for malignant neoplasm of breast: Secondary | ICD-10-CM | POA: Diagnosis not present

## 2018-05-08 ENCOUNTER — Other Ambulatory Visit: Payer: Self-pay

## 2018-05-08 ENCOUNTER — Encounter: Payer: Self-pay | Admitting: Nurse Practitioner

## 2018-05-08 ENCOUNTER — Ambulatory Visit (INDEPENDENT_AMBULATORY_CARE_PROVIDER_SITE_OTHER): Payer: PPO | Admitting: Nurse Practitioner

## 2018-05-08 DIAGNOSIS — F411 Generalized anxiety disorder: Secondary | ICD-10-CM

## 2018-05-08 DIAGNOSIS — I10 Essential (primary) hypertension: Secondary | ICD-10-CM | POA: Diagnosis not present

## 2018-05-08 DIAGNOSIS — K219 Gastro-esophageal reflux disease without esophagitis: Secondary | ICD-10-CM | POA: Diagnosis not present

## 2018-05-08 DIAGNOSIS — E034 Atrophy of thyroid (acquired): Secondary | ICD-10-CM

## 2018-05-08 DIAGNOSIS — M8580 Other specified disorders of bone density and structure, unspecified site: Secondary | ICD-10-CM

## 2018-05-08 DIAGNOSIS — E782 Mixed hyperlipidemia: Secondary | ICD-10-CM | POA: Diagnosis not present

## 2018-05-08 DIAGNOSIS — E663 Overweight: Secondary | ICD-10-CM

## 2018-05-08 DIAGNOSIS — R200 Anesthesia of skin: Secondary | ICD-10-CM

## 2018-05-08 DIAGNOSIS — E8881 Metabolic syndrome: Secondary | ICD-10-CM | POA: Diagnosis not present

## 2018-05-08 MED ORDER — ATENOLOL 50 MG PO TABS: 50.0000 mg | ORAL_TABLET | Freq: Every day | ORAL | 1 refills | Status: DC

## 2018-05-08 MED ORDER — SIMVASTATIN 40 MG PO TABS: 40.0000 mg | ORAL_TABLET | Freq: Every day | ORAL | 1 refills | Status: DC

## 2018-05-08 MED ORDER — OMEPRAZOLE 20 MG PO CPDR: 20.0000 mg | DELAYED_RELEASE_CAPSULE | Freq: Every day | ORAL | 1 refills | Status: DC

## 2018-05-08 MED ORDER — LEVOTHYROXINE SODIUM 75 MCG PO TABS: 75.0000 ug | ORAL_TABLET | Freq: Every day | ORAL | 1 refills | Status: DC

## 2018-05-08 MED ORDER — LORAZEPAM 0.5 MG PO TABS: 0.5000 mg | ORAL_TABLET | Freq: Two times a day (BID) | ORAL | 2 refills | Status: DC | PRN

## 2018-05-08 MED ORDER — MELOXICAM 15 MG PO TABS: 15.0000 mg | ORAL_TABLET | Freq: Every day | ORAL | 0 refills | Status: DC

## 2018-05-08 MED ORDER — ENALAPRIL-HYDROCHLOROTHIAZIDE 10-25 MG PO TABS: 1.0000 | ORAL_TABLET | Freq: Every day | ORAL | 1 refills | Status: DC

## 2018-05-08 NOTE — Progress Notes (Signed)
Patient ID: Janice Zamora, female   DOB: [birth year], 83 y.o.   MRN: [id number]    Virtual Visit via telephone Note  I connected with Audie Vidales on 05/08/18 at 8:20AM by telephone and verified that I am speaking with the correct person using two identifiers. Analisia Shorten is currently located at home and no one is currently with her during visit. The provider, Mary-Margaret Hassell Done, FNP is located in their office at time of visit.  I discussed the limitations, risks, security and privacy concerns of performing an evaluation and management service by telephone and the availability of in person appointments. I also discussed with the patient that there may be a patient responsible charge related to this service. The patient expressed understanding and agreed to proceed.   History and Present Illness:   Chief Complaint: medical management of chronic issues  HPI:  1. Essential hypertension  No c/o chest pain, sob or headache. Does  check blood pressure at home, running in 619'J systolic BP Readings from Last 3 Encounters:  02/05/18 127/63  10/24/17 121/68  08/10/17 123/60     2. Mixed hyperlipidemia  Watches fats in diet . Does no exercise  3. Hypothyroidism due to acquired atrophy of thyroid  No problem that she is aware of  4. Gastroesophageal reflux disease without esophagitis  Patient is on omperazole daily and works well to keep symptoms under control  5. Osteopenia, unspecified location  Last bone density was 12/17- she has aged out of repeat dexascans Does try to walk some everyday  6. Metabolic syndrome  She does not check blood sugars at home. Last hgab1c was 6.2  7. GAD (generalized anxiety disorder)  Takes ativan daily. She says being couped up in the house has been difficult.  8. Overweight (BMI 25.0-29.9)  Weight has stayed the same.    Outpatient Encounter Medications as of 05/08/2018  Medication Sig  . atenolol (TENORMIN) 50 MG tablet Take 1 tablet (50 mg  total) by mouth daily.  . enalapril-hydrochlorothiazide (VASERETIC) 10-25 MG tablet Take 1 tablet by mouth daily.  Marland Kitchen levothyroxine (SYNTHROID) 75 MCG tablet Take 1 tablet (75 mcg total) by mouth daily before breakfast.  . LORazepam (ATIVAN) 0.5 MG tablet Take 1 tablet (0.5 mg total) by mouth 2 (two) times daily as needed. for anxiety  . meloxicam (MOBIC) 15 MG tablet Take 1 tablet (15 mg total) by mouth daily.  . Multiple Vitamin (MULTIVITAMIN WITH MINERALS) TABS tablet Take 1 tablet by mouth daily.  Marland Kitchen omeprazole (PRILOSEC) 20 MG capsule Take 1 capsule (20 mg total) by mouth daily.  . simvastatin (ZOCOR) 40 MG tablet Take 1 tablet (40 mg total) by mouth at bedtime.  . triamcinolone cream (KENALOG) 0.1 % Apply to affected area daily for 1 week the QOD       New complaints: None today  Social history: Lives by herself and children heck on her daily  Review of Systems  Constitutional: Negative for diaphoresis and weight loss.  Eyes: Negative for blurred vision, double vision and pain.  Respiratory: Negative for shortness of breath.   Cardiovascular: Negative for chest pain, palpitations, orthopnea and leg swelling.  Gastrointestinal: Negative for abdominal pain.  Skin: Negative for rash.  Neurological: Negative for dizziness, sensory change, loss of consciousness, weakness and headaches.  Endo/Heme/Allergies: Negative for polydipsia. Does not bruise/bleed easily.  Psychiatric/Behavioral: Negative for memory loss. The patient does not have insomnia.   All other systems reviewed and are negative.      Observations/Objective: Alert  and oriented- answers all questions appropriately BP 122/78 HR 82- patient took herself at home.  Assessment and Plan: Seth Morson comes in today with chief complaint of Medical Management of Chronic Issues   Diagnosis and orders addressed:  1. Essential hypertension Low sodium diet - atenolol (TENORMIN) 50 MG tablet; Take 1 tablet (50 mg  total) by mouth daily.  Dispense: 90 tablet; Refill: 1 - enalapril-hydrochlorothiazide (VASERETIC) 10-25 MG tablet; Take 1 tablet by mouth daily.  Dispense: 90 tablet; Refill: 1  2. Mixed hyperlipidemia Low fat diet - simvastatin (ZOCOR) 40 MG tablet; Take 1 tablet (40 mg total) by mouth at bedtime.  Dispense: 90 tablet; Refill: 1  3. Hypothyroidism due to acquired atrophy of thyroid - levothyroxine (SYNTHROID) 75 MCG tablet; Take 1 tablet (75 mcg total) by mouth daily before breakfast.  Dispense: 90 tablet; Refill: 1  4. Gastroesophageal reflux disease without esophagitis Avoid spicy foods Do not eat 2 hours prior to bedtime - omeprazole (PRILOSEC) 20 MG capsule; Take 1 capsule (20 mg total) by mouth daily.  Dispense: 90 capsule; Refill: 1  5. Osteopenia, unspecified location weight bearing exercise encouraged  6. Metabolic syndrome watch carbs in diet  7. GAD (generalized anxiety disorder) Stress management - LORazepam (ATIVAN) 0.5 MG tablet; Take 1 tablet (0.5 mg total) by mouth 2 (two) times daily as needed. for anxiety  Dispense: 30 tablet; Refill: 2  8. Overweight (BMI 25.0-29.9) Discussed diet and exercise for person with BMI >25 Will recheck weight in 3-6 months   9. Left leg numbness Has improved - meloxicam (MOBIC) 15 MG tablet; Take 1 tablet (15 mg total) by mouth daily.  Dispense: 30 tablet; Refill: 0   Previous labs reviewed Health Maintenance reviewed Diet and exercise encouraged  Follow up plan: 3 months      I discussed the assessment and treatment plan with the patient. The patient was provided an opportunity to ask questions and all were answered. The patient agreed with the plan and demonstrated an understanding of the instructions.   The patient was advised to call back or seek an in-person evaluation if the symptoms worsen or if the condition fails to improve as anticipated.  The above assessment and management plan was discussed with the patient.  The patient verbalized understanding of and has agreed to the management plan. Patient is aware to call the clinic if symptoms persist or worsen. Patient is aware when to return to the clinic for a follow-up visit. Patient educated on when it is appropriate to go to the emergency department.    I provided 15  minutes of non-face-to-face time during this encounter.    Mary-Margaret Hassell Done, FNP

## 2018-07-23 DIAGNOSIS — H2511 Age-related nuclear cataract, right eye: Secondary | ICD-10-CM | POA: Diagnosis not present

## 2018-07-23 DIAGNOSIS — H2512 Age-related nuclear cataract, left eye: Secondary | ICD-10-CM | POA: Diagnosis not present

## 2018-07-23 DIAGNOSIS — H02831 Dermatochalasis of right upper eyelid: Secondary | ICD-10-CM | POA: Diagnosis not present

## 2018-07-23 DIAGNOSIS — H43812 Vitreous degeneration, left eye: Secondary | ICD-10-CM | POA: Diagnosis not present

## 2018-08-08 ENCOUNTER — Other Ambulatory Visit: Payer: Self-pay

## 2018-08-09 ENCOUNTER — Encounter: Payer: Self-pay | Admitting: Nurse Practitioner

## 2018-08-09 ENCOUNTER — Ambulatory Visit (INDEPENDENT_AMBULATORY_CARE_PROVIDER_SITE_OTHER): Payer: PPO

## 2018-08-09 ENCOUNTER — Ambulatory Visit (INDEPENDENT_AMBULATORY_CARE_PROVIDER_SITE_OTHER): Payer: PPO | Admitting: Nurse Practitioner

## 2018-08-09 VITALS — BP 130/64 | HR 62 | Temp 96.9°F | Ht 61.0 in | Wt 162.0 lb

## 2018-08-09 DIAGNOSIS — K219 Gastro-esophageal reflux disease without esophagitis: Secondary | ICD-10-CM

## 2018-08-09 DIAGNOSIS — E663 Overweight: Secondary | ICD-10-CM

## 2018-08-09 DIAGNOSIS — F411 Generalized anxiety disorder: Secondary | ICD-10-CM | POA: Diagnosis not present

## 2018-08-09 DIAGNOSIS — E8881 Metabolic syndrome: Secondary | ICD-10-CM

## 2018-08-09 DIAGNOSIS — E782 Mixed hyperlipidemia: Secondary | ICD-10-CM | POA: Diagnosis not present

## 2018-08-09 DIAGNOSIS — I10 Essential (primary) hypertension: Secondary | ICD-10-CM

## 2018-08-09 DIAGNOSIS — E034 Atrophy of thyroid (acquired): Secondary | ICD-10-CM

## 2018-08-09 DIAGNOSIS — M8580 Other specified disorders of bone density and structure, unspecified site: Secondary | ICD-10-CM

## 2018-08-09 LAB — CMP14+EGFR
ALT: 16 IU/L (ref 0–32)
AST: 16 IU/L (ref 0–40)
Albumin/Globulin Ratio: 2.2 (ref 1.2–2.2)
Albumin: 4.8 g/dL — ABNORMAL HIGH (ref 3.6–4.6)
Alkaline Phosphatase: 63 IU/L (ref 39–117)
BUN/Creatinine Ratio: 17 (ref 12–28)
BUN: 17 mg/dL (ref 8–27)
Bilirubin Total: 0.3 mg/dL (ref 0.0–1.2)
CO2: 27 mmol/L (ref 20–29)
Calcium: 10.1 mg/dL (ref 8.7–10.3)
Chloride: 100 mmol/L (ref 96–106)
Creatinine, Ser: 1.03 mg/dL — ABNORMAL HIGH (ref 0.57–1.00)
GFR calc Af Amer: 59 mL/min/{1.73_m2} — ABNORMAL LOW (ref >59)
GFR calc non Af Amer: 51 mL/min/{1.73_m2} — ABNORMAL LOW (ref >59)
Globulin, Total: 2.2 g/dL (ref 1.5–4.5)
Glucose: 128 mg/dL — ABNORMAL HIGH (ref 65–99)
Potassium: 3.9 mmol/L (ref 3.5–5.2)
Sodium: 140 mmol/L (ref 134–144)
Total Protein: 7 g/dL (ref 6.0–8.5)

## 2018-08-09 LAB — LIPID PANEL
Chol/HDL Ratio: 3.5 ratio (ref 0.0–4.4)
Cholesterol, Total: 147 mg/dL (ref 100–199)
HDL: 42 mg/dL (ref >39)
LDL Calculated: 73 mg/dL (ref 0–99)
Triglycerides: 162 mg/dL — ABNORMAL HIGH (ref 0–149)
VLDL Cholesterol Cal: 32 mg/dL (ref 5–40)

## 2018-08-09 MED ORDER — OMEPRAZOLE 20 MG PO CPDR: 20.0000 mg | DELAYED_RELEASE_CAPSULE | Freq: Every day | ORAL | 1 refills | Status: DC

## 2018-08-09 MED ORDER — LEVOTHYROXINE SODIUM 75 MCG PO TABS: 75.0000 ug | ORAL_TABLET | Freq: Every day | ORAL | 1 refills | Status: DC

## 2018-08-09 MED ORDER — LORAZEPAM 0.5 MG PO TABS: 0.5000 mg | ORAL_TABLET | Freq: Two times a day (BID) | ORAL | 2 refills | Status: DC | PRN

## 2018-08-09 MED ORDER — SIMVASTATIN 40 MG PO TABS: 40.0000 mg | ORAL_TABLET | Freq: Every day | ORAL | 1 refills | Status: DC

## 2018-08-09 MED ORDER — ENALAPRIL-HYDROCHLOROTHIAZIDE 10-25 MG PO TABS: 1.0000 | ORAL_TABLET | Freq: Every day | ORAL | 1 refills | Status: DC

## 2018-08-09 MED ORDER — ATENOLOL 50 MG PO TABS: 50.0000 mg | ORAL_TABLET | Freq: Every day | ORAL | 1 refills | Status: DC

## 2018-08-09 NOTE — Patient Instructions (Signed)

## 2018-08-09 NOTE — Progress Notes (Signed)
Subjective:    Patient ID: Janice Zamora, female    DOB: 1935/05/07, 83 y.o.   MRN: [id number]  Chief Complaint: Medical Management of Chronic Issues    HPI:  1. Essential hypertension No c/o chest pain, sob or headache. Does not check blood pressure at home. BP Readings from Last 3 Encounters:  08/09/18 130/64  02/05/18 127/63  10/24/17 121/68     2. Hypothyroidism due to acquired atrophy of thyroid No problems that she is aware of.  3. Mixed hyperlipidemia Eats what she wants to. Stays active but no dedicated exercise.  4. Gastroesophageal reflux disease without esophagitis On omeprazole daily and works well for her.  5. GAD (generalized anxiety disorder) Takes ativan as needed. She takes 2-3 x a week.  6. Metabolic syndrome Not watching carbs and does not check blood sugars at home.  7. Osteopenia, unspecified location Last dexascan 10/28/15- she no longer wants to repaet this due to her age.  8. Overweight (BMI 25.0-29.9) Has lost a couple of pounds since last visit.    Outpatient Encounter Medications as of 08/09/2018  Medication Sig  . atenolol (TENORMIN) 50 MG tablet Take 1 tablet (50 mg total) by mouth daily.  . enalapril-hydrochlorothiazide (VASERETIC) 10-25 MG tablet Take 1 tablet by mouth daily.  Marland Kitchen levothyroxine (SYNTHROID) 75 MCG tablet Take 1 tablet (75 mcg total) by mouth daily before breakfast.  . LORazepam (ATIVAN) 0.5 MG tablet Take 1 tablet (0.5 mg total) by mouth 2 (two) times daily as needed. for anxiety  . Multiple Vitamin (MULTIVITAMIN WITH MINERALS) TABS tablet Take 1 tablet by mouth daily.  Marland Kitchen omeprazole (PRILOSEC) 20 MG capsule Take 1 capsule (20 mg total) by mouth daily.  . simvastatin (ZOCOR) 40 MG tablet Take 1 tablet (40 mg total) by mouth at bedtime.  . triamcinolone cream (KENALOG) 0.1 % Apply to affected area daily for 1 week the QOD   Past Surgical History:  Procedure Laterality Date  . BREAST SURGERY     fibriotic cyst removed   . CHOLECYSTECTOMY      Family History  Problem Relation Age of Onset  . Hypertension Mother   . Hypertension Father   . Hyperlipidemia Sister   . Hyperlipidemia Brother   . Hyperlipidemia Sister   . Hyperlipidemia Sister   . Healthy Daughter   . Healthy Son   . Healthy Daughter   . Healthy Son   . Breast cancer Neg Hx     New complaints: None today  Social history: Lives by herself. Has 2 daughters that check on her daily  Controlled substance contract: 08/09/18    Review of Systems  Constitutional: Negative for activity change and appetite change.  HENT: Negative.   Eyes: Negative for pain.  Respiratory: Negative for shortness of breath.   Cardiovascular: Negative for chest pain, palpitations and leg swelling.  Gastrointestinal: Negative for abdominal pain.  Endocrine: Negative for polydipsia.  Genitourinary: Negative.   Skin: Negative for rash.  Neurological: Negative for dizziness, weakness and headaches.  Hematological: Does not bruise/bleed easily.  Psychiatric/Behavioral: Negative.   All other systems reviewed and are negative.      Objective:   Physical Exam Vitals signs and nursing note reviewed.  Constitutional:      General: She is not in acute distress.    Appearance: Normal appearance. She is well-developed.  HENT:     Head: Normocephalic.     Nose: Nose normal.  Eyes:     Pupils: Pupils are equal, round, and  reactive to light.  Neck:     Musculoskeletal: Normal range of motion and neck supple.     Vascular: No carotid bruit or JVD.  Cardiovascular:     Rate and Rhythm: Normal rate and regular rhythm.     Heart sounds: Normal heart sounds.  Pulmonary:     Effort: Pulmonary effort is normal. No respiratory distress.     Breath sounds: Normal breath sounds. No wheezing or rales.  Chest:     Chest wall: No tenderness.  Abdominal:     General: Bowel sounds are normal. There is no distension or abdominal bruit.     Palpations: Abdomen is  soft. There is no hepatomegaly, splenomegaly, mass or pulsatile mass.     Tenderness: There is no abdominal tenderness.  Musculoskeletal: Normal range of motion.     Right lower leg: No edema.     Left lower leg: No edema.  Lymphadenopathy:     Cervical: No cervical adenopathy.  Skin:    General: Skin is warm and dry.  Neurological:     Mental Status: She is alert and oriented to person, place, and time.     Deep Tendon Reflexes: Reflexes are normal and symmetric.  Psychiatric:        Behavior: Behavior normal.        Thought Content: Thought content normal.        Judgment: Judgment normal.    BP 130/64   Pulse 62   Temp (!) 96.9 F (36.1 C) (Oral)   Ht 5\' 1"  (1.549 m)   Wt 162 lb (73.5 kg)   BMI 30.61 kg/m   Chest xray- no acute or chronic findings-Preliminary reading by Ronnald Collum, FNP  [facility]  EKG- NSR- Mary-Margaret Hassell Done, FNP       Assessment & Plan:  Janice Zamora comes in today with chief complaint of Medical Management of Chronic Issues   Diagnosis and orders addressed:  1. Essential hypertension Low sodium diet - DG Chest 2 View; Future - EKG 12-Lead - atenolol (TENORMIN) 50 MG tablet; Take 1 tablet (50 mg total) by mouth daily.  Dispense: 90 tablet; Refill: 1 - enalapril-hydrochlorothiazide (VASERETIC) 10-25 MG tablet; Take 1 tablet by mouth daily.  Dispense: 90 tablet; Refill: 1  2. Hypothyroidism due to acquired atrophy of thyroid - levothyroxine (SYNTHROID) 75 MCG tablet; Take 1 tablet (75 mcg total) by mouth daily before breakfast.  Dispense: 90 tablet; Refill: 1  3. Mixed hyperlipidemia Low fat diet - simvastatin (ZOCOR) 40 MG tablet; Take 1 tablet (40 mg total) by mouth at bedtime.  Dispense: 90 tablet; Refill: 1  4. Gastroesophageal reflux disease without esophagitis Avoid spicy foods Do not eat 2 hours prior to bedtime - omeprazole (PRILOSEC) 20 MG capsule; Take 1 capsule (20 mg total) by mouth daily.  Dispense: 90 capsule; Refill: 1  5.  GAD (generalized anxiety disorder) Stress management - LORazepam (ATIVAN) 0.5 MG tablet; Take 1 tablet (0.5 mg total) by mouth 2 (two) times daily as needed. for anxiety  Dispense: 30 tablet; Refill: 2  6. Metabolic syndrome Watch carbs in diet  7. Osteopenia, unspecified location Weight bearing exercise  8. Overweight (BMI 25.0-29.9) Discussed diet and exercise for person with BMI >25 Will recheck weight in 3-6 months   Labs pending Health Maintenance reviewed Diet and exercise encouraged  Follow up plan: 3 months   Mary-Margaret Hassell Done, FNP

## 2018-08-09 NOTE — Addendum Note (Signed)
Addended by: Chevis Pretty on: 08/09/2018 09:29 AM   Modules accepted: Orders

## 2018-08-14 ENCOUNTER — Other Ambulatory Visit: Payer: Self-pay | Admitting: *Deleted

## 2018-08-14 DIAGNOSIS — R739 Hyperglycemia, unspecified: Secondary | ICD-10-CM | POA: Diagnosis not present

## 2018-08-14 DIAGNOSIS — E669 Obesity, unspecified: Secondary | ICD-10-CM

## 2018-08-14 DIAGNOSIS — E663 Overweight: Secondary | ICD-10-CM

## 2018-08-14 DIAGNOSIS — N904 Leukoplakia of vulva: Secondary | ICD-10-CM

## 2018-08-14 LAB — BAYER DCA HB A1C WAIVED: HB A1C (BAYER DCA - WAIVED): 6.4 % (ref <7.0)

## 2018-08-24 DIAGNOSIS — H2512 Age-related nuclear cataract, left eye: Secondary | ICD-10-CM | POA: Diagnosis not present

## 2018-08-24 DIAGNOSIS — Z01818 Encounter for other preprocedural examination: Secondary | ICD-10-CM | POA: Diagnosis not present

## 2018-08-24 DIAGNOSIS — H2511 Age-related nuclear cataract, right eye: Secondary | ICD-10-CM | POA: Diagnosis not present

## 2018-08-31 DIAGNOSIS — H25811 Combined forms of age-related cataract, right eye: Secondary | ICD-10-CM | POA: Diagnosis not present

## 2018-08-31 DIAGNOSIS — H2512 Age-related nuclear cataract, left eye: Secondary | ICD-10-CM | POA: Diagnosis not present

## 2018-08-31 DIAGNOSIS — H2511 Age-related nuclear cataract, right eye: Secondary | ICD-10-CM | POA: Diagnosis not present

## 2018-09-14 DIAGNOSIS — H25812 Combined forms of age-related cataract, left eye: Secondary | ICD-10-CM | POA: Diagnosis not present

## 2018-09-14 DIAGNOSIS — H2512 Age-related nuclear cataract, left eye: Secondary | ICD-10-CM | POA: Diagnosis not present

## 2018-11-19 ENCOUNTER — Ambulatory Visit: Payer: Self-pay | Admitting: Nurse Practitioner

## 2018-12-11 ENCOUNTER — Other Ambulatory Visit: Payer: Self-pay

## 2018-12-11 ENCOUNTER — Ambulatory Visit (INDEPENDENT_AMBULATORY_CARE_PROVIDER_SITE_OTHER): Payer: PPO | Admitting: Nurse Practitioner

## 2018-12-11 ENCOUNTER — Encounter: Payer: Self-pay | Admitting: Nurse Practitioner

## 2018-12-11 VITALS — BP 132/68 | HR 59 | Temp 96.6°F | Ht 61.0 in | Wt 158.8 lb

## 2018-12-11 DIAGNOSIS — M8580 Other specified disorders of bone density and structure, unspecified site: Secondary | ICD-10-CM

## 2018-12-11 DIAGNOSIS — E8881 Metabolic syndrome: Secondary | ICD-10-CM | POA: Diagnosis not present

## 2018-12-11 DIAGNOSIS — I10 Essential (primary) hypertension: Secondary | ICD-10-CM

## 2018-12-11 DIAGNOSIS — E663 Overweight: Secondary | ICD-10-CM

## 2018-12-11 DIAGNOSIS — F411 Generalized anxiety disorder: Secondary | ICD-10-CM

## 2018-12-11 DIAGNOSIS — K219 Gastro-esophageal reflux disease without esophagitis: Secondary | ICD-10-CM | POA: Diagnosis not present

## 2018-12-11 DIAGNOSIS — Z23 Encounter for immunization: Secondary | ICD-10-CM

## 2018-12-11 DIAGNOSIS — E782 Mixed hyperlipidemia: Secondary | ICD-10-CM

## 2018-12-11 DIAGNOSIS — E034 Atrophy of thyroid (acquired): Secondary | ICD-10-CM | POA: Diagnosis not present

## 2018-12-11 LAB — BAYER DCA HB A1C WAIVED: HB A1C (BAYER DCA - WAIVED): 6.2 % (ref <7.0)

## 2018-12-11 MED ORDER — ENALAPRIL-HYDROCHLOROTHIAZIDE 10-25 MG PO TABS: 1.0000 | ORAL_TABLET | Freq: Every day | ORAL | 1 refills | Status: DC

## 2018-12-11 MED ORDER — ATENOLOL 50 MG PO TABS: 50.0000 mg | ORAL_TABLET | Freq: Every day | ORAL | 1 refills | Status: DC

## 2018-12-11 MED ORDER — SIMVASTATIN 40 MG PO TABS: 40.0000 mg | ORAL_TABLET | Freq: Every day | ORAL | 1 refills | Status: DC

## 2018-12-11 MED ORDER — OMEPRAZOLE 20 MG PO CPDR: 20.0000 mg | DELAYED_RELEASE_CAPSULE | Freq: Every day | ORAL | 1 refills | Status: DC

## 2018-12-11 MED ORDER — LEVOTHYROXINE SODIUM 75 MCG PO TABS: 75.0000 ug | ORAL_TABLET | Freq: Every day | ORAL | 1 refills | Status: DC

## 2018-12-11 MED ORDER — LORAZEPAM 0.5 MG PO TABS: 0.5000 mg | ORAL_TABLET | Freq: Two times a day (BID) | ORAL | 2 refills | Status: DC | PRN

## 2018-12-11 NOTE — Patient Instructions (Signed)

## 2018-12-11 NOTE — Progress Notes (Signed)
Subjective:    Patient ID: Janice Zamora, female    DOB: April 16, 1935, 83 y.o.   MRN: [id number]   Chief Complaint: Medical Management of Chronic Issues    HPI:  1. Essential hypertension No c/o chest pain, sob or headache. Does not check blood pressure at home. BP Readings from Last 3 Encounters:  12/11/18 (!) 146/71  08/09/18 130/64  02/05/18 127/63     2. Mixed hyperlipidemia Not watching diet. Does no exercise but does stay busy. Lab Results  Component Value Date   CHOL 147 08/09/2018   HDL 42 08/09/2018   LDLCALC 73 08/09/2018   TRIG 162 (H) 08/09/2018   CHOLHDL 3.5 08/09/2018     3. Gastroesophageal reflux disease without esophagitis Is on omeprazole daily. No symptoms when she takes meds.  4. Hypothyroidism due to acquired atrophy of thyroid No problems that she is aware of. Lab Results  Component Value Date   TSH 2.920 02/05/2018     5. Osteopenia, unspecified location No weight bearing exercise. Has aged out of dexascans  6. Metabolic syndrome Does not check blood sugars at home. Lab Results  Component Value Date   HGBA1C 6.4 08/14/2018     7. Overweight (BMI 25.0-29.9) No recent weight changes Wt Readings from Last 3 Encounters:  12/11/18 158 lb 12.8 oz (72 kg)  08/09/18 162 lb (73.5 kg)  02/05/18 166 lb (75.3 kg)   BMI Readings from Last 3 Encounters:  12/11/18 30.00 kg/m  08/09/18 30.61 kg/m  02/05/18 31.37 kg/m    GAD 7 : Generalized Anxiety Score 12/11/2018  Nervous, Anxious, on Edge 0  Control/stop worrying 1  Worry too much - different things 1  Trouble relaxing 0  Restless 0  Easily annoyed or irritable 0  Afraid - awful might happen 0  Total GAD 7 Score 2  Anxiety Difficulty Not difficult at all          Outpatient Encounter Medications as of 12/11/2018  Medication Sig  . atenolol (TENORMIN) 50 MG tablet Take 1 tablet (50 mg total) by mouth daily.  . enalapril-hydrochlorothiazide (VASERETIC) 10-25 MG tablet  Take 1 tablet by mouth daily.  Marland Kitchen levothyroxine (SYNTHROID) 75 MCG tablet Take 1 tablet (75 mcg total) by mouth daily before breakfast.  . LORazepam (ATIVAN) 0.5 MG tablet Take 1 tablet (0.5 mg total) by mouth 2 (two) times daily as needed. for anxiety  . Multiple Vitamin (MULTIVITAMIN WITH MINERALS) TABS tablet Take 1 tablet by mouth daily.  Marland Kitchen omeprazole (PRILOSEC) 20 MG capsule Take 1 capsule (20 mg total) by mouth daily.  . simvastatin (ZOCOR) 40 MG tablet Take 1 tablet (40 mg total) by mouth at bedtime.  . triamcinolone cream (KENALOG) 0.1 % Apply to affected area daily for 1 week the QOD    Past Surgical History:  Procedure Laterality Date  . BREAST SURGERY     fibriotic cyst removed  . CHOLECYSTECTOMY      Family History  Problem Relation Age of Onset  . Hypertension Mother   . Hypertension Father   . Hyperlipidemia Sister   . Hyperlipidemia Brother   . Hyperlipidemia Sister   . Hyperlipidemia Sister   . Healthy Daughter   . Healthy Son   . Healthy Daughter   . Healthy Son   . Breast cancer Neg Hx     New complaints: none  Social history: Lives alone and daughters check on hr daily  Controlled substance contract: 08/14/18    Review of Systems  Constitutional:  Negative for activity change and appetite change.  HENT: Negative.   Eyes: Negative for pain.  Respiratory: Negative for shortness of breath.   Cardiovascular: Negative for chest pain, palpitations and leg swelling.  Gastrointestinal: Negative for abdominal pain.  Endocrine: Negative for polydipsia.  Genitourinary: Negative.   Skin: Negative for rash.  Neurological: Negative for dizziness, weakness and headaches.  Hematological: Does not bruise/bleed easily.  Psychiatric/Behavioral: Negative.   All other systems reviewed and are negative.      Objective:   Physical Exam Vitals signs and nursing note reviewed.  Constitutional:      General: She is not in acute distress.    Appearance: Normal  appearance. She is well-developed.  HENT:     Head: Normocephalic.     Nose: Nose normal.  Eyes:     Pupils: Pupils are equal, round, and reactive to light.  Neck:     Musculoskeletal: Normal range of motion and neck supple.     Vascular: No carotid bruit or JVD.  Cardiovascular:     Rate and Rhythm: Normal rate and regular rhythm.     Heart sounds: Normal heart sounds.  Pulmonary:     Effort: Pulmonary effort is normal. No respiratory distress.     Breath sounds: Normal breath sounds. No wheezing or rales.  Chest:     Chest wall: No tenderness.  Abdominal:     General: Bowel sounds are normal. There is no distension or abdominal bruit.     Palpations: Abdomen is soft. There is no hepatomegaly, splenomegaly, mass or pulsatile mass.     Tenderness: There is no abdominal tenderness.  Musculoskeletal: Normal range of motion.  Lymphadenopathy:     Cervical: No cervical adenopathy.  Skin:    General: Skin is warm and dry.  Neurological:     Mental Status: She is alert and oriented to person, place, and time.     Deep Tendon Reflexes: Reflexes are normal and symmetric.  Psychiatric:        Behavior: Behavior normal.        Thought Content: Thought content normal.        Judgment: Judgment normal.    BP 132/68 (BP Location: Left Arm, Cuff Size: Normal)   Pulse (!) 59   Temp (!) 96.6 F (35.9 C) (Temporal)   Ht 5\' 1"  (1.549 m)   Wt 158 lb 12.8 oz (72 kg)   SpO2 99%   BMI 30.00 kg/m        Assessment & Plan:  Janice Zamora comes in today with chief complaint of Medical Management of Chronic Issues   Diagnosis and orders addressed:  1. Essential hypertension Low sodium diet - atenolol (TENORMIN) 50 MG tablet; Take 1 tablet (50 mg total) by mouth daily.  Dispense: 90 tablet; Refill: 1 - enalapril-hydrochlorothiazide (VASERETIC) 10-25 MG tablet; Take 1 tablet by mouth daily.  Dispense: 90 tablet; Refill: 1  2. Mixed hyperlipidemia Low fat diet - simvastatin (ZOCOR)  40 MG tablet; Take 1 tablet (40 mg total) by mouth at bedtime.  Dispense: 90 tablet; Refill: 1  3. Gastroesophageal reflux disease without esophagitis Avoid spicy foods Do not eat 2 hours prior to bedtime - omeprazole (PRILOSEC) 20 MG capsule; Take 1 capsule (20 mg total) by mouth daily.  Dispense: 90 capsule; Refill: 1  4. Hypothyroidism due to acquired atrophy of thyroid - levothyroxine (SYNTHROID) 75 MCG tablet; Take 1 tablet (75 mcg total) by mouth daily before breakfast.  Dispense: 90 tablet; Refill: 1  5. Osteopenia, unspecified location Weight bearing exercises encouraged  6. Metabolic syndrome Watch carbs in diet  7. Overweight (BMI 25.0-29.9) Discussed diet and exercise for person with BMI >25 Will recheck weight in 3-6 months  8. GAD (generalized anxiety disorder) stress management - LORazepam (ATIVAN) 0.5 MG tablet; Take 1 tablet (0.5 mg total) by mouth 2 (two) times daily as needed. for anxiety  Dispense: 30 tablet; Refill: 2   Labs pending Health Maintenance reviewed Diet and exercise encouraged  Follow up plan: 6 months   Mary-Margaret Hassell Done, FNP

## 2018-12-12 LAB — CMP14+EGFR
ALT: 17 IU/L (ref 0–32)
AST: 19 IU/L (ref 0–40)
Albumin/Globulin Ratio: 2 (ref 1.2–2.2)
Albumin: 4.7 g/dL — ABNORMAL HIGH (ref 3.6–4.6)
Alkaline Phosphatase: 73 IU/L (ref 39–117)
BUN/Creatinine Ratio: 22 (ref 12–28)
BUN: 21 mg/dL (ref 8–27)
Bilirubin Total: 0.4 mg/dL (ref 0.0–1.2)
CO2: 23 mmol/L (ref 20–29)
Calcium: 10.1 mg/dL (ref 8.7–10.3)
Chloride: 101 mmol/L (ref 96–106)
Creatinine, Ser: 0.97 mg/dL (ref 0.57–1.00)
GFR calc Af Amer: 63 mL/min/{1.73_m2} (ref >59)
GFR calc non Af Amer: 55 mL/min/{1.73_m2} — ABNORMAL LOW (ref >59)
Globulin, Total: 2.4 g/dL (ref 1.5–4.5)
Glucose: 126 mg/dL — ABNORMAL HIGH (ref 65–99)
Potassium: 4.4 mmol/L (ref 3.5–5.2)
Sodium: 141 mmol/L (ref 134–144)
Total Protein: 7.1 g/dL (ref 6.0–8.5)

## 2018-12-12 LAB — THYROID PANEL WITH TSH
Free Thyroxine Index: 2.9 (ref 1.2–4.9)
T3 Uptake Ratio: 28 % (ref 24–39)
T4, Total: 10.3 ug/dL (ref 4.5–12.0)
TSH: 1.99 u[IU]/mL (ref 0.450–4.500)

## 2018-12-12 LAB — LIPID PANEL
Chol/HDL Ratio: 3.9 ratio (ref 0.0–4.4)
Cholesterol, Total: 173 mg/dL (ref 100–199)
HDL: 44 mg/dL (ref >39)
LDL Chol Calc (NIH): 102 mg/dL — ABNORMAL HIGH (ref 0–99)
Triglycerides: 152 mg/dL — ABNORMAL HIGH (ref 0–149)
VLDL Cholesterol Cal: 27 mg/dL (ref 5–40)

## 2019-02-14 ENCOUNTER — Other Ambulatory Visit: Payer: Self-pay | Admitting: Nurse Practitioner

## 2019-02-14 DIAGNOSIS — Z1231 Encounter for screening mammogram for malignant neoplasm of breast: Secondary | ICD-10-CM

## 2019-03-25 ENCOUNTER — Other Ambulatory Visit: Payer: Self-pay

## 2019-03-25 ENCOUNTER — Ambulatory Visit
Admission: RE | Admit: 2019-03-25 | Discharge: 2019-03-25 | Disposition: A | Discharge status: Home / Self Care | Payer: PPO | Source: Ambulatory Visit | Attending: Nurse Practitioner | Admitting: Nurse Practitioner

## 2019-03-25 DIAGNOSIS — Z1231 Encounter for screening mammogram for malignant neoplasm of breast: Secondary | ICD-10-CM | POA: Diagnosis not present

## 2019-03-26 ENCOUNTER — Other Ambulatory Visit: Payer: Self-pay | Admitting: Nurse Practitioner

## 2019-03-26 DIAGNOSIS — N6489 Other specified disorders of breast: Secondary | ICD-10-CM

## 2019-04-08 ENCOUNTER — Ambulatory Visit
Admission: RE | Admit: 2019-04-08 | Discharge: 2019-04-08 | Disposition: A | Discharge status: Home / Self Care | Payer: PPO | Source: Ambulatory Visit | Attending: Nurse Practitioner | Admitting: Nurse Practitioner

## 2019-04-08 ENCOUNTER — Other Ambulatory Visit: Payer: Self-pay

## 2019-04-08 ENCOUNTER — Ambulatory Visit: Payer: PPO

## 2019-04-08 DIAGNOSIS — R928 Other abnormal and inconclusive findings on diagnostic imaging of breast: Secondary | ICD-10-CM | POA: Diagnosis not present

## 2019-04-08 DIAGNOSIS — N6489 Other specified disorders of breast: Secondary | ICD-10-CM

## 2019-04-16 ENCOUNTER — Telehealth: Payer: Self-pay | Admitting: Nurse Practitioner

## 2019-04-16 NOTE — Chronic Care Management (AMB) (Signed)
  Chronic Care Management   Note  04/16/2019 Name: Janice Zamora MRN: [id number] DOB: 22-Nov-1935  Janice Zamora is a 84 y.o. year old female who is a primary care patient of Chevis Pretty, FNP. I reached out to [place] by phone today in response to a referral sent by [name] health plan.     [name] was given information about Chronic Care Management services today including:  1. CCM service includes personalized support from designated clinical staff supervised by her physician, including individualized plan of care and coordination with other care providers 2. 24/7 contact phone numbers for assistance for urgent and routine care needs. 3. Service will only be billed when office clinical staff spend 20 minutes or more in a month to coordinate care. 4. Only one practitioner may furnish and bill the service in a calendar month. 5. The patient may stop CCM services at any time (effective at the end of the month) by phone call to the office staff. 6. The patient will be responsible for cost sharing (co-pay) of up to 20% of the service fee (after annual deductible is met).  Patient agreed to services and verbal consent obtained.   Follow up plan: Telephone appointment with care management team member scheduled for: 08/13/2019.  [place], [place] [address] Direct Dial: [phone] [email or web address]: [email or web address]

## 2019-06-10 ENCOUNTER — Other Ambulatory Visit: Payer: Self-pay

## 2019-06-10 ENCOUNTER — Encounter: Payer: Self-pay | Admitting: Nurse Practitioner

## 2019-06-10 ENCOUNTER — Ambulatory Visit (INDEPENDENT_AMBULATORY_CARE_PROVIDER_SITE_OTHER): Payer: PPO | Admitting: Nurse Practitioner

## 2019-06-10 VITALS — BP 134/61 | HR 59 | Temp 97.4°F | Resp 20 | Ht 61.0 in | Wt 162.0 lb

## 2019-06-10 DIAGNOSIS — M8580 Other specified disorders of bone density and structure, unspecified site: Secondary | ICD-10-CM

## 2019-06-10 DIAGNOSIS — K219 Gastro-esophageal reflux disease without esophagitis: Secondary | ICD-10-CM

## 2019-06-10 DIAGNOSIS — E663 Overweight: Secondary | ICD-10-CM

## 2019-06-10 DIAGNOSIS — F411 Generalized anxiety disorder: Secondary | ICD-10-CM | POA: Diagnosis not present

## 2019-06-10 DIAGNOSIS — E782 Mixed hyperlipidemia: Secondary | ICD-10-CM | POA: Diagnosis not present

## 2019-06-10 DIAGNOSIS — E8881 Metabolic syndrome: Secondary | ICD-10-CM | POA: Diagnosis not present

## 2019-06-10 DIAGNOSIS — E034 Atrophy of thyroid (acquired): Secondary | ICD-10-CM | POA: Diagnosis not present

## 2019-06-10 DIAGNOSIS — I10 Essential (primary) hypertension: Secondary | ICD-10-CM

## 2019-06-10 MED ORDER — LORAZEPAM 0.5 MG PO TABS: 0.5000 mg | ORAL_TABLET | Freq: Two times a day (BID) | ORAL | 2 refills | Status: DC | PRN

## 2019-06-10 MED ORDER — OMEPRAZOLE 20 MG PO CPDR: 20.0000 mg | DELAYED_RELEASE_CAPSULE | Freq: Every day | ORAL | 1 refills | Status: DC

## 2019-06-10 MED ORDER — ENALAPRIL-HYDROCHLOROTHIAZIDE 10-25 MG PO TABS: 1.0000 | ORAL_TABLET | Freq: Every day | ORAL | 1 refills | Status: DC

## 2019-06-10 MED ORDER — LEVOTHYROXINE SODIUM 75 MCG PO TABS: 75.0000 ug | ORAL_TABLET | Freq: Every day | ORAL | 1 refills | Status: DC

## 2019-06-10 MED ORDER — SIMVASTATIN 40 MG PO TABS: 40.0000 mg | ORAL_TABLET | Freq: Every day | ORAL | 1 refills | Status: DC

## 2019-06-10 MED ORDER — ATENOLOL 50 MG PO TABS: 50.0000 mg | ORAL_TABLET | Freq: Every day | ORAL | 1 refills | Status: DC

## 2019-06-10 NOTE — Progress Notes (Signed)
Subjective:    Patient ID: Janice Zamora, female    DOB: 08/01/1935, 84 y.o.   MRN: [id number]   Chief Complaint: Medical Management of Chronic Issues    HPI:  1. Essential hypertension Patient only checks her blood pressures at home on occasion. She states they run in the 130s. Denies dizziness, chest pain, or presyncopal episodes. BP Readings from Last 3 Encounters:  06/10/19 134/61  12/11/18 132/68  08/09/18 130/64      2. Mixed hyperlipidemia Patient states that she doesn't watch her diet. Denies muscle aches with Zocor. Lab Results  Component Value Date   CHOL 173 12/11/2018   HDL 44 12/11/2018   LDLCALC 102 (H) 12/11/2018   TRIG 152 (H) 12/11/2018   CHOLHDL 3.9 12/11/2018     3. Overweight (BMI 25.0-29.9) Wt Readings from Last 3 Encounters:  06/10/19 162 lb (73.5 kg)  12/11/18 158 lb 12.8 oz (72 kg)  08/09/18 162 lb (73.5 kg)   BMI Readings from Last 3 Encounters:  06/10/19 30.61 kg/m  12/11/18 30.00 kg/m  08/09/18 30.61 kg/m  Patient states she doesn't watch her diet. Only a four pound weight gain since last visit.  4. Metabolic syndrome Denies increase in urination, hunger, or thirst. Lab Results  Component Value Date   HGBA1C 6.2 12/11/2018    5. Gastroesophageal reflux disease without esophagitis Patient states compliance with Prilosec. No increase in acid reflux symptoms and she denies eating acid triggering foods.  6. Hypothyroidism due to acquired atrophy of thyroid Lab Results  Component Value Date   TSH 1.990 12/11/2018  No new complaints related to hyperthyroidism.   7. Osteopenia, unspecified location Does not do weight bearing exercises but stays active.   8. GAD (generalized anxiety disorder) GAD 7 : Generalized Anxiety Score 06/10/2019 12/11/2018  Nervous, Anxious, on Edge 0 0  Control/stop worrying 0 1  Worry too much - different things 0 1  Trouble relaxing 0 0  Restless 0 0  Easily annoyed or irritable 0 0    Afraid - awful might happen 0 0  Total GAD 7 Score 0 2  Anxiety Difficulty Not difficult at all Not difficult at all  Patient states she is not feeling anxious and doesn't take her Ativan often.     Outpatient Encounter Medications as of 06/10/2019  Medication Sig  . atenolol (TENORMIN) 50 MG tablet Take 1 tablet (50 mg total) by mouth daily.  . enalapril-hydrochlorothiazide (VASERETIC) 10-25 MG tablet Take 1 tablet by mouth daily.  Marland Kitchen levothyroxine (SYNTHROID) 75 MCG tablet Take 1 tablet (75 mcg total) by mouth daily before breakfast.  . LORazepam (ATIVAN) 0.5 MG tablet Take 1 tablet (0.5 mg total) by mouth 2 (two) times daily as needed. for anxiety  . Multiple Vitamin (MULTIVITAMIN WITH MINERALS) TABS tablet Take 1 tablet by mouth daily.  Marland Kitchen omeprazole (PRILOSEC) 20 MG capsule Take 1 capsule (20 mg total) by mouth daily.  . simvastatin (ZOCOR) 40 MG tablet Take 1 tablet (40 mg total) by mouth at bedtime.  . triamcinolone cream (KENALOG) 0.1 % Apply to affected area daily for 1 week the QOD   No facility-administered encounter medications on file as of 06/10/2019.    Past Surgical History:  Procedure Laterality Date  . BREAST SURGERY     fibriotic cyst removed  . CHOLECYSTECTOMY      Family History  Problem Relation Age of Onset  . Hypertension Mother   . Hypertension Father   . Hyperlipidemia Sister   .  Hyperlipidemia Brother   . Hyperlipidemia Sister   . Hyperlipidemia Sister   . Healthy Daughter   . Healthy Son   . Healthy Daughter   . Healthy Son   . Breast cancer Neg Hx     New complaints: Patient denies any new complaints today.  Social history: Patient denies new social complaints.  Controlled substance contract:    Review of Systems  All other systems reviewed and are negative.      Objective:   Physical Exam Constitutional:      Appearance: Normal appearance.  HENT:     Head: Normocephalic.  Eyes:     Conjunctiva/sclera: Conjunctivae normal.   Cardiovascular:     Rate and Rhythm: Normal rate and regular rhythm.     Pulses: Normal pulses.     Heart sounds: Normal heart sounds.  Pulmonary:     Effort: Pulmonary effort is normal.     Comments: Breath sounds slightly diminished. Abdominal:     General: Bowel sounds are normal.  Musculoskeletal:        General: Normal range of motion.     Cervical back: Normal range of motion.  Skin:    General: Skin is warm and dry.  Neurological:     Mental Status: She is alert and oriented to person, place, and time.  Psychiatric:        Mood and Affect: Mood normal.        Behavior: Behavior normal.       Assessment & Plan:  Ashyra Kerper Holleman comes in today with chief complaint of Medical Management of Chronic Issues   Diagnosis and orders addressed:  1. Essential hypertension - atenolol (TENORMIN) 50 MG tablet; Take 1 tablet (50 mg total) by mouth daily.  Dispense: 90 tablet; Refill: 1 - enalapril-hydrochlorothiazide (VASERETIC) 10-25 MG tablet; Take 1 tablet by mouth daily.  Dispense: 90 tablet; Refill: 1 - CBC with Differential/Platelet - CMP14+EGFR  2. Mixed hyperlipidemia - simvastatin (ZOCOR) 40 MG tablet; Take 1 tablet (40 mg total) by mouth at bedtime.  Dispense: 90 tablet; Refill: 1 - Lipid panel  3. Overweight (BMI 25.0-29.9) Discussed diet and exercise for person with BMI >25 Will recheck weight in 3-6 months   4. Metabolic syndrome Continue to monitor for elevated blood pressure or diabetes symptoms.  5. Gastroesophageal reflux disease without esophagitis - omeprazole (PRILOSEC) 20 MG capsule; Take 1 capsule (20 mg total) by mouth daily.  Dispense: 90 capsule; Refill: 1  6. Hypothyroidism due to acquired atrophy of thyroid - levothyroxine (SYNTHROID) 75 MCG tablet; Take 1 tablet (75 mcg total) by mouth daily before breakfast.  Dispense: 90 tablet; Refill: 1 - Thyroid Panel With TSH  7. Osteopenia, unspecified location Weight bearing exercise  8.  GAD (generalized anxiety disorder) - LORazepam (ATIVAN) 0.5 MG tablet; Take 1 tablet (0.5 mg total) by mouth 2 (two) times daily as needed. for anxiety  Dispense: 30 tablet; Refill: 2   Labs pending Health Maintenance reviewed Diet and exercise encouraged  Follow up plan: Follow up in 6 months or sooner if new complaints arise.  Mary-Margaret Hassell Done, FNP Robynn Pane, RN, FNP student

## 2019-06-10 NOTE — Patient Instructions (Signed)

## 2019-06-11 LAB — LIPID PANEL
Chol/HDL Ratio: 3.2 ratio (ref 0.0–4.4)
Cholesterol, Total: 148 mg/dL (ref 100–199)
HDL: 46 mg/dL (ref >39)
LDL Chol Calc (NIH): 79 mg/dL (ref 0–99)
Triglycerides: 133 mg/dL (ref 0–149)
VLDL Cholesterol Cal: 23 mg/dL (ref 5–40)

## 2019-06-11 LAB — CBC WITH DIFFERENTIAL/PLATELET
Basophils Absolute: 0.1 10*3/uL (ref 0.0–0.2)
Basos: 1 %
EOS (ABSOLUTE): 0.2 10*3/uL (ref 0.0–0.4)
Eos: 2 %
Hematocrit: 39 % (ref 34.0–46.6)
Hemoglobin: 13.6 g/dL (ref 11.1–15.9)
Immature Grans (Abs): 0.1 10*3/uL (ref 0.0–0.1)
Immature Granulocytes: 1 %
Lymphocytes Absolute: 2.3 10*3/uL (ref 0.7–3.1)
Lymphs: 32 %
MCH: 32.5 pg (ref 26.6–33.0)
MCHC: 34.9 g/dL (ref 31.5–35.7)
MCV: 93 fL (ref 79–97)
Monocytes Absolute: 0.7 10*3/uL (ref 0.1–0.9)
Monocytes: 10 %
Neutrophils Absolute: 3.9 10*3/uL (ref 1.4–7.0)
Neutrophils: 54 %
Platelets: 210 10*3/uL (ref 150–450)
RBC: 4.19 x10E6/uL (ref 3.77–5.28)
RDW: 12.4 % (ref 11.7–15.4)
WBC: 7.1 10*3/uL (ref 3.4–10.8)

## 2019-06-11 LAB — CMP14+EGFR
ALT: 14 IU/L (ref 0–32)
AST: 20 IU/L (ref 0–40)
Albumin/Globulin Ratio: 1.8 (ref 1.2–2.2)
Albumin: 4.5 g/dL (ref 3.6–4.6)
Alkaline Phosphatase: 71 IU/L (ref 39–117)
BUN/Creatinine Ratio: 24 (ref 12–28)
BUN: 25 mg/dL (ref 8–27)
Bilirubin Total: 0.3 mg/dL (ref 0.0–1.2)
CO2: 23 mmol/L (ref 20–29)
Calcium: 9.9 mg/dL (ref 8.7–10.3)
Chloride: 99 mmol/L (ref 96–106)
Creatinine, Ser: 1.06 mg/dL — ABNORMAL HIGH (ref 0.57–1.00)
GFR calc Af Amer: 57 mL/min/{1.73_m2} — ABNORMAL LOW (ref >59)
GFR calc non Af Amer: 49 mL/min/{1.73_m2} — ABNORMAL LOW (ref >59)
Globulin, Total: 2.5 g/dL (ref 1.5–4.5)
Glucose: 130 mg/dL — ABNORMAL HIGH (ref 65–99)
Potassium: 4.2 mmol/L (ref 3.5–5.2)
Sodium: 140 mmol/L (ref 134–144)
Total Protein: 7 g/dL (ref 6.0–8.5)

## 2019-06-11 LAB — THYROID PANEL WITH TSH
Free Thyroxine Index: 2.5 (ref 1.2–4.9)
T3 Uptake Ratio: 28 % (ref 24–39)
T4, Total: 9.1 ug/dL (ref 4.5–12.0)
TSH: 2.28 u[IU]/mL (ref 0.450–4.500)

## 2019-08-13 ENCOUNTER — Ambulatory Visit: Payer: PPO | Admitting: *Deleted

## 2019-08-13 DIAGNOSIS — E034 Atrophy of thyroid (acquired): Secondary | ICD-10-CM

## 2019-08-13 DIAGNOSIS — E782 Mixed hyperlipidemia: Secondary | ICD-10-CM

## 2019-08-13 DIAGNOSIS — I10 Essential (primary) hypertension: Secondary | ICD-10-CM

## 2019-08-13 NOTE — Chronic Care Management (AMB) (Signed)
  Chronic Care Management   Initial Visit Outreach  08/13/2019 Name: Janice Zamora MRN: [id number] DOB: [birth year]  Referred by: Chevis Pretty, FNP Reason for referral : Chronic Care Management   An unsuccessful Initial Telephone Visit was attempted today. The patient was referred to the case management team for assistance with care management and care coordination.   RN Care Plan           This Visit's Progress   . Chronic Disease Management Needs       CARE PLAN ENTRY (see longtitudinal plan of care for additional care plan information)  Current Barriers:  . Chronic Disease Management support, education, and care coordination needs related to HTN, hypothyroidism, osteopenia, HLD, metabolic syndrome  Clinical Goals: . Over the next 10 days, patient will be contacted by a Care Guide to reschedule their Initial CCM Visit . Over the next 30 days, patient will have an Initial CCM Visit with a member of the embedded CCM team to discuss self-management of their chronic medical conditions  Interventions: . Chart reviewed in preparation for initial visit telephone call . Collaboration with other care team members as needed . Unsuccessful outreach to patient  . A HIPPA compliant phone message was left for the patient providing contact information and requesting a return call.  . Request sent to care guides to reach out and reschedule patient's initial visit  Patient Self Care Activities: . Undetermined   Initial goal documentation        Follow Up Plan: A HIPPA compliant phone message was left for the patient providing contact information and requesting a return call.  The care management team will reach out to the patient again over the next 10 days.   Chong Sicilian, BSN, RN-BC Embedded Chronic Care Manager [facility] Direct Dial: [phone]

## 2019-08-15 ENCOUNTER — Telehealth: Payer: Self-pay | Admitting: Nurse Practitioner

## 2019-08-15 NOTE — Chronic Care Management (AMB) (Signed)
  Care Management   Note  08/15/2019 Name: Janice Zamora MRN: [id number] DOB: March 03, 1935  Janice Zamora is a 84 y.o. year old female who is a primary care patient of Hassell Done, Mary-Margaret, FNP and is actively engaged with the care management team. I reached out to [place] by phone today to assist with re-scheduling an initial visit with the RN Case Manager  Follow up plan: Patient declines participating in CCM services  and engagement by the care management team. Appropriate care team members and provider have been notified via electronic communication.   Noreene Larsson, [place], [place], [place] [address] Direct Dial: [phone] [email or web address]

## 2019-09-12 ENCOUNTER — Encounter: Payer: Self-pay | Admitting: Nurse Practitioner

## 2019-09-13 ENCOUNTER — Telehealth: Payer: PPO

## 2019-09-17 ENCOUNTER — Other Ambulatory Visit: Payer: Self-pay | Admitting: Nurse Practitioner

## 2019-09-17 DIAGNOSIS — F411 Generalized anxiety disorder: Secondary | ICD-10-CM

## 2019-10-28 ENCOUNTER — Other Ambulatory Visit: Payer: Self-pay | Admitting: Nurse Practitioner

## 2019-11-07 ENCOUNTER — Other Ambulatory Visit: Payer: Self-pay | Admitting: Nurse Practitioner

## 2019-11-07 DIAGNOSIS — F411 Generalized anxiety disorder: Secondary | ICD-10-CM

## 2019-12-12 ENCOUNTER — Encounter: Payer: Self-pay | Admitting: Nurse Practitioner

## 2019-12-12 ENCOUNTER — Ambulatory Visit (INDEPENDENT_AMBULATORY_CARE_PROVIDER_SITE_OTHER): Payer: PPO | Admitting: Nurse Practitioner

## 2019-12-12 ENCOUNTER — Other Ambulatory Visit: Payer: Self-pay

## 2019-12-12 ENCOUNTER — Ambulatory Visit (INDEPENDENT_AMBULATORY_CARE_PROVIDER_SITE_OTHER): Payer: PPO

## 2019-12-12 VITALS — BP 132/64 | HR 61 | Temp 97.1°F | Resp 20 | Ht 61.0 in | Wt 167.0 lb

## 2019-12-12 DIAGNOSIS — E034 Atrophy of thyroid (acquired): Secondary | ICD-10-CM | POA: Diagnosis not present

## 2019-12-12 DIAGNOSIS — E8881 Metabolic syndrome: Secondary | ICD-10-CM

## 2019-12-12 DIAGNOSIS — M8588 Other specified disorders of bone density and structure, other site: Secondary | ICD-10-CM

## 2019-12-12 DIAGNOSIS — Z780 Asymptomatic menopausal state: Secondary | ICD-10-CM | POA: Diagnosis not present

## 2019-12-12 DIAGNOSIS — E782 Mixed hyperlipidemia: Secondary | ICD-10-CM

## 2019-12-12 DIAGNOSIS — Z23 Encounter for immunization: Secondary | ICD-10-CM | POA: Diagnosis not present

## 2019-12-12 DIAGNOSIS — I10 Essential (primary) hypertension: Secondary | ICD-10-CM

## 2019-12-12 DIAGNOSIS — K219 Gastro-esophageal reflux disease without esophagitis: Secondary | ICD-10-CM

## 2019-12-12 DIAGNOSIS — F411 Generalized anxiety disorder: Secondary | ICD-10-CM | POA: Diagnosis not present

## 2019-12-12 DIAGNOSIS — E663 Overweight: Secondary | ICD-10-CM | POA: Diagnosis not present

## 2019-12-12 MED ORDER — ATENOLOL 50 MG PO TABS: 50.0000 mg | ORAL_TABLET | Freq: Every day | ORAL | 1 refills | Status: DC

## 2019-12-12 MED ORDER — LORAZEPAM 0.5 MG PO TABS: 0.5000 mg | ORAL_TABLET | Freq: Two times a day (BID) | ORAL | 2 refills | Status: DC | PRN

## 2019-12-12 MED ORDER — SIMVASTATIN 40 MG PO TABS: 40.0000 mg | ORAL_TABLET | Freq: Every day | ORAL | 1 refills | Status: DC

## 2019-12-12 MED ORDER — LEVOTHYROXINE SODIUM 75 MCG PO TABS: 75.0000 ug | ORAL_TABLET | Freq: Every day | ORAL | 1 refills | Status: DC

## 2019-12-12 MED ORDER — ENALAPRIL-HYDROCHLOROTHIAZIDE 10-25 MG PO TABS: 1.0000 | ORAL_TABLET | Freq: Every day | ORAL | 1 refills | Status: DC

## 2019-12-12 MED ORDER — OMEPRAZOLE 20 MG PO CPDR: 20.0000 mg | DELAYED_RELEASE_CAPSULE | Freq: Every day | ORAL | 1 refills | Status: DC

## 2019-12-12 NOTE — Progress Notes (Signed)
Subjective:    Patient ID: Janice Zamora, female    DOB: 1935/10/29, 84 y.o.   MRN: [id number]   Chief Complaint: Medical Management of Chronic Issues    HPI:  1. Essential hypertension No c/o chest pain, sob or headache. Does not check blood pressure today. BP Readings from Last 3 Encounters:  12/12/19 140/61  06/10/19 134/61  12/11/18 132/68     2. Mixed hyperlipidemia Does not really watch diet and does very little exercise. Lab Results  Component Value Date   CHOL 148 06/10/2019   HDL 46 06/10/2019   LDLCALC 79 06/10/2019   TRIG 133 06/10/2019   CHOLHDL 3.2 06/10/2019    3. Hypothyroidism due to acquired atrophy of thyroid No problems that she is aware of. Lab Results  Component Value Date   TSH 2.280 06/10/2019     4. Gastroesophageal reflux disease without esophagitis Is on omeprazole daily and is doing well.  5. Metabolic syndrome Does not check blood sugars at home.  Lab Results  Component Value Date   HGBA1C 6.2 12/11/2018     6. Osteopenia of lumbar spine Is having dexascan today. Will discuss results when available.  7. GAD (generalized anxiety disorder) She has  Prescription for ativan but does not take very often.   8. Overweight (BMI 25.0-29.9) No recent weight changes Wt Readings from Last 3 Encounters:  12/12/19 167 lb (75.8 kg)  06/10/19 162 lb (73.5 kg)  12/11/18 158 lb 12.8 oz (72 kg)   BMI Readings from Last 3 Encounters:  12/12/19 31.55 kg/m  06/10/19 30.61 kg/m  12/11/18 30.00 kg/m      Outpatient Encounter Medications as of 12/12/2019  Medication Sig  . LORazepam (ATIVAN) 0.5 MG tablet Take 1 tablet by mouth twice daily as needed for anxiety  . atenolol (TENORMIN) 50 MG tablet Take 1 tablet (50 mg total) by mouth daily.  . enalapril-hydrochlorothiazide (VASERETIC) 10-25 MG tablet Take 1 tablet by mouth daily.  Marland Kitchen levothyroxine (SYNTHROID) 75 MCG tablet Take 1 tablet (75 mcg total) by mouth daily before  breakfast.  . Multiple Vitamin (MULTIVITAMIN WITH MINERALS) TABS tablet Take 1 tablet by mouth daily.  Marland Kitchen omeprazole (PRILOSEC) 20 MG capsule Take 1 capsule (20 mg total) by mouth daily.  . simvastatin (ZOCOR) 40 MG tablet Take 1 tablet (40 mg total) by mouth at bedtime.  . triamcinolone cream (KENALOG) 0.1 % APPLY TO AFFECTED AREA DAILY FOR 1 WEEK THEN APPLY TO AFFECTED AREA EVERY OTHER DAY   No facility-administered encounter medications on file as of 12/12/2019.    Past Surgical History:  Procedure Laterality Date  . BREAST SURGERY     fibriotic cyst removed  . CHOLECYSTECTOMY      Family History  Problem Relation Age of Onset  . Hypertension Mother   . Hypertension Father   . Hyperlipidemia Sister   . Hyperlipidemia Brother   . Hyperlipidemia Sister   . Hyperlipidemia Sister   . Healthy Daughter   . Healthy Son   . Healthy Daughter   . Healthy Son   . Breast cancer Neg Hx     New complaints: Occasional burning left side of bottom when she voids  Social history: Lives by herself. Family checks on her daily  Controlled substance contract: 12/12/19    Review of Systems  Constitutional: Negative for diaphoresis.  Eyes: Negative for pain.  Respiratory: Negative for shortness of breath.   Cardiovascular: Negative for chest pain, palpitations and leg swelling.  Gastrointestinal: Negative  for abdominal pain.  Endocrine: Negative for polydipsia.  Skin: Negative for rash.  Neurological: Negative for dizziness, weakness and headaches.  Hematological: Does not bruise/bleed easily.  All other systems reviewed and are negative.      Objective:   Physical Exam Vitals and nursing note reviewed.  Constitutional:      General: She is not in acute distress.    Appearance: Normal appearance. She is well-developed.  HENT:     Head: Normocephalic.     Nose: Nose normal.  Eyes:     Pupils: Pupils are equal, round, and reactive to light.  Neck:     Vascular: No carotid  bruit or JVD.  Cardiovascular:     Rate and Rhythm: Normal rate and regular rhythm.     Heart sounds: Normal heart sounds.  Pulmonary:     Effort: Pulmonary effort is normal. No respiratory distress.     Breath sounds: Normal breath sounds. No wheezing or rales.  Chest:     Chest wall: No tenderness.  Abdominal:     General: Bowel sounds are normal. There is no distension or abdominal bruit.     Palpations: Abdomen is soft. There is no hepatomegaly, splenomegaly, mass or pulsatile mass.     Tenderness: There is no abdominal tenderness.  Musculoskeletal:        General: Normal range of motion.     Cervical back: Normal range of motion and neck supple.  Lymphadenopathy:     Cervical: No cervical adenopathy.  Skin:    General: Skin is warm and dry.  Neurological:     Mental Status: She is alert and oriented to person, place, and time.     Deep Tendon Reflexes: Reflexes are normal and symmetric.  Psychiatric:        Behavior: Behavior normal.        Thought Content: Thought content normal.        Judgment: Judgment normal.    BP 132/64 (BP Location: Left Arm, Cuff Size: Normal)   Pulse 61   Temp (!) 97.1 F (36.2 C) (Temporal)   Resp 20   Ht 5\' 1"  (1.549 m)   Wt 167 lb (75.8 kg)   SpO2 97%   BMI 31.55 kg/m         Assessment & Plan:  Janice Zamora comes in today with chief complaint of Medical Management of Chronic Issues   Diagnosis and orders addressed:  1. Essential hypertension Low sodium diet - atenolol (TENORMIN) 50 MG tablet; Take 1 tablet (50 mg total) by mouth daily.  Dispense: 90 tablet; Refill: 1 - enalapril-hydrochlorothiazide (VASERETIC) 10-25 MG tablet; Take 1 tablet by mouth daily.  Dispense: 90 tablet; Refill: 1  2. Mixed hyperlipidemia Low fat diet - simvastatin (ZOCOR) 40 MG tablet; Take 1 tablet (40 mg total) by mouth at bedtime.  Dispense: 90 tablet; Refill: 1  3. Hypothyroidism due to acquired atrophy of thyroid Labs pending -  levothyroxine (SYNTHROID) 75 MCG tablet; Take 1 tablet (75 mcg total) by mouth daily before breakfast.  Dispense: 90 tablet; Refill: 1  4. Gastroesophageal reflux disease without esophagitis Avoid spicy foods Do not eat 2 hours prior to bedtime - omeprazole (PRILOSEC) 20 MG capsule; Take 1 capsule (20 mg total) by mouth daily.  Dispense: 90 capsule; Refill: 1  5. Metabolic syndrome Watch carbs in diet  6. Osteopenia of lumbar spine Will discuss results once recieved - DG [facility] DEXA  7. GAD (generalized anxiety disorder) Stress management - LORazepam (  ATIVAN) 0.5 MG tablet; Take 1 tablet (0.5 mg total) by mouth 2 (two) times daily as needed. for anxiety  Dispense: 30 tablet; Refill: 2  8. Overweight (BMI 25.0-29.9) Discussed diet and exercise for person with BMI >25 Will recheck weight in 3-6 months    Labs pending Health Maintenance reviewed Diet and exercise encouraged  Follow up plan: 6 months   Mary-Margaret Hassell Done, FNP

## 2019-12-12 NOTE — Patient Instructions (Signed)
Stress, Adult Stress is a normal reaction to life events. Stress is what you feel when life demands more than you are used to, or more than you think you can handle. Some stress can be useful, such as studying for a test or meeting a deadline at work. Stress that occurs too often or for too long can cause problems. It can affect your emotional health and interfere with relationships and normal daily activities. Too much stress can weaken your body's defense system (immune system) and increase your risk for physical illness. If you already have a medical problem, stress can make it worse. What are the causes? All sorts of life events can cause stress. An event that causes stress for one person may not be stressful for another person. Major life events, whether positive or negative, commonly cause stress. Examples include:  Losing a job or starting a new job.  Losing a loved one.  Moving to a new town or home.  Getting married or divorced.  Having a baby.  Getting injured or sick. Less obvious life events can also cause stress, especially if they occur day after day or in combination with each other. Examples include:  Working long hours.  Driving in traffic.  Caring for children.  Being in debt.  Being in a difficult relationship. What are the signs or symptoms? Stress can cause emotional symptoms, including:  Anxiety. This is feeling worried, afraid, on edge, overwhelmed, or out of control.  Anger, including irritation or impatience.  Depression. This is feeling sad, down, helpless, or guilty.  Trouble focusing, remembering, or making decisions. Stress can cause physical symptoms, including:  Aches and pains. These may affect your head, neck, back, stomach, or other areas of your body.  Tight muscles or a clenched jaw.  Low energy.  Trouble sleeping. Stress can cause unhealthy behaviors, including:  Eating to feel better (overeating) or skipping meals.  Working too  much or putting off tasks.  Smoking, drinking alcohol, or using drugs to feel better. How is this diagnosed? Stress is diagnosed through an assessment by your health care provider. He or she may diagnose this condition based on:  Your symptoms and any stressful life events.  Your medical history.  Tests to rule out other causes of your symptoms. Depending on your condition, your health care provider may refer you to a specialist for further evaluation. How is this treated?  Stress management techniques are the recommended treatment for stress. Medicine is not typically recommended for the treatment of stress. Techniques to reduce your reaction to stressful life events include:  Stress identification. Monitor yourself for symptoms of stress and identify what causes stress for you. These skills may help you to avoid or prepare for stressful events.  Time management. Set your priorities, keep a calendar of events, and learn to say no. Taking these actions can help you avoid making too many commitments. Techniques for coping with stress include:  Rethinking the problem. Try to think realistically about stressful events rather than ignoring them or overreacting. Try to find the positives in a stressful situation rather than focusing on the negatives.  Exercise. Physical exercise can release both physical and emotional tension. The key is to find a form of exercise that you enjoy and do it regularly.  Relaxation techniques. These relax the body and mind. The key is to find one or more that you enjoy and use the techniques regularly. Examples include: ? Meditation, deep breathing, or progressive relaxation techniques. ? Yoga or   tai chi. ? Biofeedback, mindfulness techniques, or journaling. ? Listening to music, being out in nature, or participating in other hobbies.  Practicing a healthy lifestyle. Eat a balanced diet, drink plenty of water, limit or avoid caffeine, and get plenty of  sleep.  Having a strong support network. Spend time with family, friends, or other people you enjoy being around. Express your feelings and talk things over with someone you trust. Counseling or talk therapy with a mental health professional may be helpful if you are having trouble managing stress on your own. Follow these instructions at home: Lifestyle   Avoid drugs.  Do not use any products that contain nicotine or tobacco, such as cigarettes, e-cigarettes, and chewing tobacco. If you need help quitting, ask your health care provider.  Limit alcohol intake to no more than 1 drink a day for nonpregnant women and 2 drinks a day for men. One drink equals 12 oz of beer, 5 oz of wine, or 1 oz of hard liquor  Do not use alcohol or drugs to relax.  Eat a balanced diet that includes fresh fruits and vegetables, whole grains, lean meats, fish, eggs, and beans, and low-fat dairy. Avoid processed foods and foods high in added fat, sugar, and salt.  Exercise at least 30 minutes on 5 or more days each week.  Get 7-8 hours of sleep each night. General instructions   Practice stress management techniques as discussed with your health care provider.  Drink enough fluid to keep your urine clear or pale yellow.  Take over-the-counter and prescription medicines only as told by your health care provider.  Keep all follow-up visits as told by your health care provider. This is important. Contact a health care provider if:  Your symptoms get worse.  You have new symptoms.  You feel overwhelmed by your problems and can no longer manage them on your own. Get help right away if:  You have thoughts of hurting yourself or others. If you ever feel like you may hurt yourself or others, or have thoughts about taking your own life, get help right away. You can go to your nearest emergency department or call:  Your local emergency services (911 in the [place]  A suicide crisis helpline, such as the  [place] at (316) 250-6172. This is open 24 hours a day. Summary  Stress is a normal reaction to life events. It can cause problems if it happens too often or for too long.  Practicing stress management techniques is the best way to treat stress.  Counseling or talk therapy with a mental health professional may be helpful if you are having trouble managing stress on your own. This information is not intended to replace advice given to you by your health care provider. Make sure you discuss any questions you have with your health care provider. Document Revised: 08/17/2018 Document Reviewed: 03/09/2016 Elsevier Patient Education  [place].

## 2019-12-13 LAB — CMP14+EGFR
ALT: 16 IU/L (ref 0–32)
AST: 16 IU/L (ref 0–40)
Albumin/Globulin Ratio: 1.9 (ref 1.2–2.2)
Albumin: 4.5 g/dL (ref 3.6–4.6)
Alkaline Phosphatase: 68 IU/L (ref 44–121)
BUN/Creatinine Ratio: 20 (ref 12–28)
BUN: 22 mg/dL (ref 8–27)
Bilirubin Total: 0.4 mg/dL (ref 0.0–1.2)
CO2: 24 mmol/L (ref 20–29)
Calcium: 10.3 mg/dL (ref 8.7–10.3)
Chloride: 97 mmol/L (ref 96–106)
Creatinine, Ser: 1.11 mg/dL — ABNORMAL HIGH (ref 0.57–1.00)
GFR calc Af Amer: 53 mL/min/{1.73_m2} — ABNORMAL LOW (ref >59)
GFR calc non Af Amer: 46 mL/min/{1.73_m2} — ABNORMAL LOW (ref >59)
Globulin, Total: 2.4 g/dL (ref 1.5–4.5)
Glucose: 144 mg/dL — ABNORMAL HIGH (ref 65–99)
Potassium: 3.9 mmol/L (ref 3.5–5.2)
Sodium: 143 mmol/L (ref 134–144)
Total Protein: 6.9 g/dL (ref 6.0–8.5)

## 2019-12-13 LAB — LIPID PANEL
Chol/HDL Ratio: 3.9 ratio (ref 0.0–4.4)
Cholesterol, Total: 160 mg/dL (ref 100–199)
HDL: 41 mg/dL (ref >39)
LDL Chol Calc (NIH): 92 mg/dL (ref 0–99)
Triglycerides: 156 mg/dL — ABNORMAL HIGH (ref 0–149)
VLDL Cholesterol Cal: 27 mg/dL (ref 5–40)

## 2019-12-13 LAB — CBC WITH DIFFERENTIAL/PLATELET
Basophils Absolute: 0.1 10*3/uL (ref 0.0–0.2)
Basos: 1 %
EOS (ABSOLUTE): 0.1 10*3/uL (ref 0.0–0.4)
Eos: 2 %
Hematocrit: 38.6 % (ref 34.0–46.6)
Hemoglobin: 13.8 g/dL (ref 11.1–15.9)
Immature Grans (Abs): 0.1 10*3/uL (ref 0.0–0.1)
Immature Granulocytes: 1 %
Lymphocytes Absolute: 2.1 10*3/uL (ref 0.7–3.1)
Lymphs: 27 %
MCH: 32.5 pg (ref 26.6–33.0)
MCHC: 35.8 g/dL — ABNORMAL HIGH (ref 31.5–35.7)
MCV: 91 fL (ref 79–97)
Monocytes Absolute: 0.7 10*3/uL (ref 0.1–0.9)
Monocytes: 9 %
Neutrophils Absolute: 4.6 10*3/uL (ref 1.4–7.0)
Neutrophils: 60 %
Platelets: 220 10*3/uL (ref 150–450)
RBC: 4.24 x10E6/uL (ref 3.77–5.28)
RDW: 11.9 % (ref 11.7–15.4)
WBC: 7.6 10*3/uL (ref 3.4–10.8)

## 2019-12-13 LAB — THYROID PANEL WITH TSH
Free Thyroxine Index: 2.1 (ref 1.2–4.9)
T3 Uptake Ratio: 26 % (ref 24–39)
T4, Total: 8.2 ug/dL (ref 4.5–12.0)
TSH: 3.34 u[IU]/mL (ref 0.450–4.500)

## 2020-01-13 ENCOUNTER — Ambulatory Visit (INDEPENDENT_AMBULATORY_CARE_PROVIDER_SITE_OTHER): Payer: PPO | Admitting: *Deleted

## 2020-01-13 DIAGNOSIS — Z0000 Encounter for general adult medical examination without abnormal findings: Secondary | ICD-10-CM

## 2020-01-13 NOTE — Progress Notes (Signed)
MEDICARE ANNUAL WELLNESS VISIT  01/13/2020  Telephone Visit Disclaimer This Medicare AWV was conducted by telephone due to national recommendations for restrictions regarding the COVID-19 Pandemic (e.g. social distancing).  I verified, using two identifiers, that I am speaking with Janice Zamora or their authorized healthcare agent. I discussed the limitations, risks, security, and privacy concerns of performing an evaluation and management service by telephone and the potential availability of an in-person appointment in the future. The patient expressed understanding and agreed to proceed.  Location of Patient: home Location of Provider (nurse):  [facility]  Subjective:    Janice Zamora is a 84 y.o. female patient of Chevis Pretty, [place] who had a Medicare Annual Wellness Visit today via telephone. 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 She has 4 grown children. She reports that she is socially active and does interact with friends/family regularly. She is not physically active and enjoys sewing.  Patient Care Team: Chevis Pretty, FNP as PCP - General (Nurse Practitioner) Irene Shipper, MD as Consulting Physician (Gastroenterology) Melina Schools, OD as Consulting Physician (Optometry)  Advanced Directives 01/13/2020 08/10/2017 11/11/2016 01/19/2016 10/20/2014  Does Patient Have a Medical Advance Directive? No No No No No  Does patient want to make changes to medical advance directive? - - No - Patient declined - -  Would patient like information on creating a medical advance directive? No - Patient declined Yes (MAU/Ambulatory/Procedural Areas - Information given) No - Patient declined No - Patient declined Yes - Educational materials given    Hospital Utilization Over the Past 12 Months: # of hospitalizations or ER visits: 0 # of surgeries: 1  Review of Systems    Patient reports that her overall health is unchanged compared to last year.  History obtained from  the patient and patient chart.   Patient Reported Readings (BP, Pulse, CBG, Weight, etc) none  Pain Assessment Pain : No/denies pain     Current Medications & Allergies (verified) Allergies as of 01/13/2020      Reactions   Anesthesia S-i-40 [propofol] Nausea And Vomiting      Medication List       Accurate as of January 13, 2020 10:04 AM. If you have any questions, ask your nurse or doctor.        atenolol 50 MG tablet Commonly known as: TENORMIN Take 1 tablet (50 mg total) by mouth daily.   enalapril-hydrochlorothiazide 10-25 MG tablet Commonly known as: VASERETIC Take 1 tablet by mouth daily.   levothyroxine 75 MCG tablet Commonly known as: Synthroid Take 1 tablet (75 mcg total) by mouth daily before breakfast.   LORazepam 0.5 MG tablet Commonly known as: ATIVAN Take 1 tablet (0.5 mg total) by mouth 2 (two) times daily as needed. for anxiety   multivitamin with minerals Tabs tablet Take 1 tablet by mouth daily.   omeprazole 20 MG capsule Commonly known as: PRILOSEC Take 1 capsule (20 mg total) by mouth daily.   simvastatin 40 MG tablet Commonly known as: ZOCOR Take 1 tablet (40 mg total) by mouth at bedtime.   triamcinolone 0.1 % Commonly known as: KENALOG APPLY TO AFFECTED AREA DAILY FOR 1 WEEK THEN APPLY TO AFFECTED AREA EVERY OTHER DAY       History (reviewed): Past Medical History:  Diagnosis Date  . Cataract   . Cholelithiasis   . Diverticulosis   . Fibrocystic breast disease   . GAD (generalized anxiety disorder)   . GERD (gastroesophageal reflux disease)   .  Hyperlipidemia   . Hypertension   . Hypothyroidism   . Metabolic syndrome   . Osteopenia    Past Surgical History:  Procedure Laterality Date  . BREAST SURGERY     fibriotic cyst removed  . CHOLECYSTECTOMY     Family History  Problem Relation Age of Onset  . Hypertension Mother   . Hypertension Father   . Hyperlipidemia Sister   . Hyperlipidemia Brother   .  Hyperlipidemia Sister   . Hyperlipidemia Sister   . Healthy Daughter   . Healthy Son   . Healthy Daughter   . Healthy Son   . Breast cancer Neg Hx    Social History   Socioeconomic History  . Marital status: Widowed    Spouse name: Not on file  . Number of children: 4  . Years of education: 8  . Highest education level: Not on file  Occupational History  . Occupation: homemaker  Tobacco Use  . Smoking status: Never Smoker  . Smokeless tobacco: Never Used  Vaping Use  . Vaping Use: Never used  Substance and Sexual Activity  . Alcohol use: No  . Drug use: No  . Sexual activity: Not Currently    Birth control/protection: Post-menopausal  Other Topics Concern  . 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 Maintains her own home and yard. Enjoys sewing (mask making).    Social Determinants of Health   Financial Resource Strain: Not on file  Food Insecurity: Not on file  Transportation Needs: Not on file  Physical Activity: Not on file  Stress: Not on file  Social Connections: Not on file    Activities of Daily Living In your present state of health, do you have any difficulty performing the following activities: 01/13/2020 12/12/2019  Hearing? N N  Vision? N N  Difficulty concentrating or making decisions? N N  Walking or climbing stairs? N N  Dressing or bathing? N N  Doing errands, shopping? N N  Preparing Food and eating ? N -  Using the Toilet? N -  In the past six months, have you accidently leaked urine? Y -  Comment upon standing -  Do you have problems with loss of bowel control? N -  Managing your Medications? N -  Managing your Finances? N -  Housekeeping or managing your Housekeeping? N -  Some recent data might be hidden    Patient Education/ Literacy How often do you need to have someone help you when you read instructions, pamphlets, or other written materials from your doctor or pharmacy?: 3 -  Sometimes What is the last grade level you completed in school?: 8th  Exercise Current Exercise Habits: The patient does not participate in regular exercise at present, Exercise limited by: None identified  Diet Patient reports consuming 3 meals a day and 1 snack(s) a day Patient reports that her primary diet is: Regular Patient reports that she does have regular access to food.   Depression Screen PHQ 2/9 Scores 01/13/2020 12/12/2019 06/10/2019 12/11/2018 08/09/2018 02/05/2018 10/24/2017  PHQ - 2 Score 0 0 0 0 0 0 0     Fall Risk Fall Risk  01/13/2020 12/12/2019 06/10/2019 12/11/2018 08/09/2018  Falls in the past year? 0 0 0 0 0     Objective:  Janice Zamora seemed alert and oriented and she participated appropriately during our telephone visit.  Blood Pressure Weight BMI  BP Readings from Last 3 Encounters:  12/12/19  132/64  06/10/19 134/61  12/11/18 132/68   Wt Readings from Last 3 Encounters:  12/12/19 167 lb (75.8 kg)  06/10/19 162 lb (73.5 kg)  12/11/18 158 lb 12.8 oz (72 kg)   BMI Readings from Last 1 Encounters:  12/12/19 31.55 kg/m    *Unable to obtain current vital signs, weight, and BMI due to telephone visit type  Hearing/Vision  . Adrine did not seem to have difficulty with hearing/understanding during the telephone conversation . Reports that she has had a formal eye exam by an eye care professional within the past year . Reports that she has not had a formal hearing evaluation within the past year *Unable to fully assess hearing and vision during telephone visit type  Cognitive Function: 6CIT Screen 01/13/2020  What Year? 0 points  What month? 0 points  What time? 0 points  Count back from 20 0 points  Months in reverse 4 points  Repeat phrase 4 points  Total Score 8   (Normal:0-7, Significant for Dysfunction: >8)  Normal Cognitive Function Screening: No: 8   Immunization & Health Maintenance Record Immunization History  Administered Date(s)  Administered  . Fluad Quad(high Dose 65+) 12/11/2018, 12/12/2019  . Influenza, High Dose Seasonal PF 11/16/2016, 12/11/2017  . Influenza,inj,Quad PF,6+ Mos 11/02/2012, 10/29/2013, 11/11/2014, 10/22/2015  . Moderna Sars-Covid-2 Vaccination 03/08/2019, 04/09/2019  . Pneumococcal Conjugate-13 09/08/2014  . Pneumococcal Polysaccharide-23 06/01/2002  . Tdap 09/14/2015    Health Maintenance  Topic Date Due  . COVID-19 Vaccine (3 - Booster for Moderna series) 10/10/2019  . MAMMOGRAM  03/24/2020  . DEXA SCAN  12/11/2021  . TETANUS/TDAP  09/13/2025  . INFLUENZA VACCINE  Completed  . PNA vac Low Risk Adult  Completed       Assessment  This is a routine wellness examination for Janice Zamora.  Health Maintenance: Due or Overdue Health Maintenance Due  Topic Date Due  . COVID-19 Vaccine (3 - Booster for Moderna series) 10/10/2019    Janice Zamora does not need a referral for Community Assistance: Care Management:   no Social Work:    no Prescription Assistance:  no Nutrition/Diabetes Education:  no   Plan:  Personalized Goals Goals Addressed            This Visit's Progress   . Patient Stated       01/13/2020 AWV Goal: Exercise for General Health   Patient will verbalize understanding of the benefits of increased physical activity:  Exercising regularly is important. It will improve your overall fitness, flexibility, and endurance.  Regular exercise also will improve your overall health. It can help you control your weight, reduce stress, and improve your bone density.  Over the next year, patient will increase physical activity as tolerated with a goal of at least 150 minutes of moderate physical activity per week.   You can tell that you are exercising at a moderate intensity if your heart starts beating faster and you start breathing faster but can still hold a conversation.  Moderate-intensity exercise ideas include:  Walking 1 mile (1.6 km) in about 15  minutes  Biking  Hiking  Golfing  Dancing  Water aerobics  Patient will verbalize understanding of everyday activities that increase physical activity by providing examples like the following: ? Yard work, such as: ? Pushing a Conservation officer, nature ? Raking and bagging leaves ? Washing your car ? Pushing a stroller ? Shoveling snow ? Gardening ? Washing windows or floors  Patient will be able to  explain general safety guidelines for exercising:   Before you start a new exercise program, talk with your health care provider.  Do not exercise so much that you hurt yourself, feel dizzy, or get very short of breath.  Wear comfortable clothes and wear shoes with good support.  Drink plenty of water while you exercise to prevent dehydration or heat stroke.  Work out until your breathing and your heartbeat get faster.       Personalized Health Maintenance & Screening Recommendations    Lung Cancer Screening Recommended: no (Low Dose CT Chest recommended if Age 91-80 years, 30 pack-year currently smoking OR have quit w/in past 15 years) Hepatitis C Screening recommended: no HIV Screening recommended: no  Advanced Directives: Written information was not prepared per patient's request.  Referrals & Orders No orders of the defined types were placed in this encounter.   Follow-up Plan . Follow-up with Chevis Pretty, FNP as planned   I have personally reviewed and noted the following in the patient's chart:   . Medical and social history . Use of alcohol, tobacco or illicit drugs  . Current medications and supplements . Functional ability and status . Nutritional status . Physical activity . Advanced directives . List of other physicians . Hospitalizations, surgeries, and ER visits in previous 12 months . Vitals . Screenings to include cognitive, depression, and falls . Referrals and appointments  In addition, I have reviewed and discussed with Janice Zamora  certain preventive protocols, quality metrics, and best practice recommendations. A written personalized care plan for preventive services as well as general preventive health recommendations is available and can be mailed to the patient at her request.      Baldomero Lamy, LPN  [id number]

## 2020-02-24 ENCOUNTER — Other Ambulatory Visit: Payer: Self-pay | Admitting: Nurse Practitioner

## 2020-02-24 DIAGNOSIS — Z1231 Encounter for screening mammogram for malignant neoplasm of breast: Secondary | ICD-10-CM

## 2020-03-23 ENCOUNTER — Other Ambulatory Visit: Payer: Self-pay

## 2020-03-23 ENCOUNTER — Ambulatory Visit (INDEPENDENT_AMBULATORY_CARE_PROVIDER_SITE_OTHER): Payer: PPO | Admitting: Family Medicine

## 2020-03-23 ENCOUNTER — Encounter: Payer: Self-pay | Admitting: Family Medicine

## 2020-03-23 VITALS — BP 122/54 | HR 62 | Temp 97.6°F | Ht 61.0 in | Wt 162.1 lb

## 2020-03-23 DIAGNOSIS — M79641 Pain in right hand: Secondary | ICD-10-CM | POA: Diagnosis not present

## 2020-03-23 MED ORDER — PREDNISONE 20 MG PO TABS: 20.0000 mg | ORAL_TABLET | Freq: Every day | ORAL | 0 refills | Status: AC

## 2020-03-23 NOTE — Progress Notes (Signed)
Established Patient Office Visit  Subjective:  Patient ID: Janice Zamora, female    DOB: 03/03/1935  Age: 85 y.o. MRN: [id number]  CC:  Chief Complaint  Patient presents with  . Hand Pain    HPI Janice Zamora presents for right hand pain x 1 week. The pain is mild, intermittent, and achy. She has been taking advil with some improvement. She reports decreased grip strength x 1 week. She has some intermittent tingling in her hand, mostly in her thumb and first two fingers. She denies injury or trauma. She reports that her symptoms improve throughout the day.  Past Medical History:  Diagnosis Date  . Cataract   . Cholelithiasis   . Diverticulosis   . Fibrocystic breast disease   . GAD (generalized anxiety disorder)   . GERD (gastroesophageal reflux disease)   . Hyperlipidemia   . Hypertension   . Hypothyroidism   . Metabolic syndrome   . Osteopenia     Past Surgical History:  Procedure Laterality Date  . BREAST SURGERY     fibriotic cyst removed  . CHOLECYSTECTOMY      Family History  Problem Relation Age of Onset  . Hypertension Mother   . Hypertension Father   . Hyperlipidemia Sister   . Hyperlipidemia Brother   . Hyperlipidemia Sister   . Hyperlipidemia Sister   . Healthy Daughter   . Healthy Son   . Healthy Daughter   . Healthy Son   . Breast cancer Neg Hx     Social History   Socioeconomic History  . Marital status: Widowed    Spouse name: Not on file  . Number of children: 4  . Years of education: 8  . Highest education level: Not on file  Occupational History  . Occupation: homemaker  Tobacco Use  . Smoking status: Never Smoker  . Smokeless tobacco: Never Used  Vaping Use  . Vaping Use: Never used  Substance and Sexual Activity  . Alcohol use: No  . Drug use: No  . Sexual activity: Not Currently    Birth control/protection: Post-menopausal  Other Topics Concern  . 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 4 grown children. Maintains her own home and yard. Enjoys sewing (mask making).    Social Determinants of Health   Financial Resource Strain: Not on file  Food Insecurity: Not on file  Transportation Needs: Not on file  Physical Activity: Not on file  Stress: Not on file  Social Connections: Not on file  Intimate Partner Violence: Not on file    Outpatient Medications Prior to Visit  Medication Sig Dispense Refill  . atenolol (TENORMIN) 50 MG tablet Take 1 tablet (50 mg total) by mouth daily. 90 tablet 1  . enalapril-hydrochlorothiazide (VASERETIC) 10-25 MG tablet Take 1 tablet by mouth daily. 90 tablet 1  . levothyroxine (SYNTHROID) 75 MCG tablet Take 1 tablet (75 mcg total) by mouth daily before breakfast. 90 tablet 1  . LORazepam (ATIVAN) 0.5 MG tablet Take 1 tablet (0.5 mg total) by mouth 2 (two) times daily as needed. for anxiety 30 tablet 2  . Multiple Vitamin (MULTIVITAMIN WITH MINERALS) TABS tablet Take 1 tablet by mouth daily.    Marland Kitchen omeprazole (PRILOSEC) 20 MG capsule Take 1 capsule (20 mg total) by mouth daily. 90 capsule 1  . simvastatin (ZOCOR) 40 MG tablet Take 1 tablet (40 mg total) by mouth at bedtime. 90 tablet 1  . triamcinolone cream (KENALOG)  0.1 % APPLY TO AFFECTED AREA DAILY FOR 1 WEEK THEN APPLY TO AFFECTED AREA EVERY OTHER DAY 30 g 0   No facility-administered medications prior to visit.    Allergies  Allergen Reactions  . Anesthesia S-I-40 [Propofol] Nausea And Vomiting    ROS Review of Systems  As per HPI.    Objective:    Physical Exam Vitals and nursing note reviewed.  Constitutional:      General: She is not in acute distress.    Appearance: Normal appearance. She is not ill-appearing, toxic-appearing or diaphoretic.  Pulmonary:     Effort: Pulmonary effort is normal. No respiratory distress.  Musculoskeletal:     Right hand: No swelling, tenderness or bony tenderness. Normal range of motion. Decreased strength (decreased grip strength).  Normal sensation. There is no disruption of two-point discrimination. Normal capillary refill. Normal pulse.     Comments: + phalens on right hand. [place] nodes present on right hand.   Skin:    General: Skin is warm and dry.  Neurological:     General: No focal deficit present.     Mental Status: She is alert and oriented to person, place, and time.  Psychiatric:        Mood and Affect: Mood normal.        Behavior: Behavior normal.     BP (!) 122/54   Pulse 62   Temp 97.6 F (36.4 C) (Temporal)   Ht 5\' 1"  (1.549 m)   Wt 162 lb 2 oz (73.5 kg)   BMI 30.63 kg/m  Wt Readings from Last 3 Encounters:  03/23/20 162 lb 2 oz (73.5 kg)  12/12/19 167 lb (75.8 kg)  06/10/19 162 lb (73.5 kg)     There are no preventive care reminders to display for this patient.  There are no preventive care reminders to display for this patient.  Lab Results  Component Value Date   TSH 3.340 12/12/2019   Lab Results  Component Value Date   WBC 7.6 12/12/2019   HGB 13.8 12/12/2019   HCT 38.6 12/12/2019   MCV 91 12/12/2019   PLT 220 12/12/2019   Lab Results  Component Value Date   NA 143 12/12/2019   K 3.9 12/12/2019   CO2 24 12/12/2019   GLUCOSE 144 (H) 12/12/2019   BUN 22 12/12/2019   CREATININE 1.11 (H) 12/12/2019   BILITOT 0.4 12/12/2019   ALKPHOS 68 12/12/2019   AST 16 12/12/2019   ALT 16 12/12/2019   PROT 6.9 12/12/2019   ALBUMIN 4.5 12/12/2019   CALCIUM 10.3 12/12/2019   Lab Results  Component Value Date   CHOL 160 12/12/2019   Lab Results  Component Value Date   HDL 41 12/12/2019   Lab Results  Component Value Date   LDLCALC 92 12/12/2019   Lab Results  Component Value Date   TRIG 156 (H) 12/12/2019   Lab Results  Component Value Date   CHOLHDL 3.9 12/12/2019   Lab Results  Component Value Date   HGBA1C 6.2 12/11/2018      Assessment & Plan:   Janice Zamora was seen today for hand pain.  Diagnoses and all orders for this visit:  Right hand  pain Discussed arthritis and carpal tunnel as cause. Continue tylenol, advil, heat, ice. Discussed wrist splint. Short prednisone burst given. Return to office for new or worsening symptoms, or if symptoms persist.  -     predniSONE (DELTASONE) 20 MG tablet; Take 1 tablet (20 mg total) by mouth  daily with breakfast for 5 days.    Follow-up: Return if symptoms worsen or fail to improve.   The patient indicates understanding of these issues and agrees with the plan.  Gwenlyn Perking, FNP

## 2020-03-23 NOTE — Patient Instructions (Signed)
Arthritis Arthritis is a term that is commonly used to refer to joint pain or joint disease. There are more than 100 types of arthritis. What are the causes? The most common cause of this condition is wear and tear of a joint. Other causes include:  Gout.  Inflammation of a joint.  An infection of a joint.  Sprains and other injuries near the joint.  A reaction to medicines or drugs, or an allergic reaction. In some cases, the cause may not be known. What are the signs or symptoms? The main symptom of this condition is pain in the joint during movement. Other symptoms include:  Redness, swelling, or stiffness at a joint.  Warmth coming from the joint.  Fever.  Overall feeling of illness. How is this diagnosed? This condition may be diagnosed with a physical exam and tests, including:  Blood tests.  Urine tests.  Imaging tests, such as X-rays, an MRI, or a CT scan. Sometimes, fluid is removed from a joint for testing. How is this treated? This condition may be treated with:  Treatment of the cause, if it is known.  Rest.  Raising (elevating) the joint.  Applying cold or hot packs to the joint.  Medicines to improve symptoms and reduce inflammation.  Injections of a steroid such as cortisone into the joint to help reduce pain and inflammation. Depending on the cause of your arthritis, you may need to make lifestyle changes to reduce stress on your joint. Changes may include:  Exercising more.  Losing weight. Follow these instructions at home: Medicines  Take over-the-counter and prescription medicines only as told by your health care provider.  Do not take aspirin to relieve pain if your health care provider thinks that gout may be causing your pain. Activity  Rest your joint if told by your health care provider. Rest is important when your disease is active and your joint feels painful, swollen, or stiff.  Avoid activities that make the pain worse. It is  important to balance activity with rest.  Exercise your joint regularly with range-of-motion exercises as told by your health care provider. Try doing low-impact exercise, such as: ? Swimming. ? Water aerobics. ? Biking. ? Walking. Managing pain, stiffness, and swelling  If directed, put ice on the joint. ? Put ice in a plastic bag. ? Place a towel between your skin and the bag. ? Leave the ice on for 20 minutes, 2-3 times per day.  If your joint is swollen, raise (elevate) it above the level of your heart if directed by your health care provider.  If your joint feels stiff in the morning, try taking a warm shower.  If directed, apply heat to the affected area as often as told by your health care provider. Use the heat source that your health care provider recommends, such as a moist heat pack or a heating pad. If you have diabetes, do not apply heat without permission from your health care provider. To apply heat: ? Place a towel between your skin and the heat source. ? Leave the heat on for 20-30 minutes. ? Remove the heat if your skin turns bright red. This is especially important if you are unable to feel pain, heat, or cold. You may have a greater risk of getting burned.      General instructions Do not use any products that contain nicotine or tobacco, such as cigarettes, e-cigarettes, and chewing tobacco. If you need help quitting, ask your health care pro Carpal Tunnel  Syndrome  Carpal tunnel syndrome is a condition that causes pain, numbness, and weakness in your hand and fingers. The carpal tunnel is a narrow area located on the palm side of your wrist. Repeated wrist motion or certain diseases may cause swelling within the tunnel. This swelling pinches the main nerve in the wrist. The main nerve in the wrist is called the median nerve. What are the causes? This condition may be caused by: Repeated and forceful wrist and hand motions. Wrist injuries. Arthritis. A cyst or  tumor in the carpal tunnel. Fluid buildup during pregnancy. Use of tools that vibrate. Sometimes the cause of this condition is not known. What increases the risk? The following factors may make you more likely to develop this condition: Having a job that requires you to repeatedly or forcefully move your wrist or hand or requires you to use tools that vibrate. This may include jobs that involve using computers, working on an [employer], or working with [place] such as Pension scheme manager. Being a woman. Having certain conditions, such as: Diabetes. Obesity. An underactive thyroid (hypothyroidism). Kidney failure. Rheumatoid arthritis. What are the signs or symptoms? Symptoms of this condition include: A tingling feeling in your fingers, especially in your thumb, index, and middle fingers. Tingling or numbness in your hand. An aching feeling in your entire arm, especially when your wrist and elbow are bent for a long time. Wrist pain that goes up your arm to your shoulder. Pain that goes down into your palm or fingers. A weak feeling in your hands. You may have trouble grabbing and holding items. Your symptoms may feel worse during the night. How is this diagnosed? This condition is diagnosed with a medical history and physical exam. You may also have tests, including: Electromyogram (EMG). This test measures electrical signals sent by your nerves into the muscles. Nerve conduction study. This test measures how well electrical signals pass through your nerves. Imaging tests, such as X-rays, ultrasound, and MRI. These tests check for possible causes of your condition. How is this treated? This condition may be treated with: Lifestyle changes. It is important to stop or change the activity that caused your condition. Doing exercise and activities to strengthen and stretch your muscles and tendons (physical therapy). Making lifestyle changes to help with your condition and learning  how to do your daily activities safely (occupational therapy). Medicines for pain and inflammation. This may include medicine that is injected into your wrist. A wrist splint or brace. Surgery. Follow these instructions at home: If you have a splint or brace: Wear the splint or brace as told by your health care provider. Remove it only as told by your health care provider. Loosen the splint or brace if your fingers tingle, become numb, or turn cold and blue. Keep the splint or brace clean. If the splint or brace is not waterproof: Do not let it get wet. Cover it with a watertight covering when you take a bath or shower. Managing pain, stiffness, and swelling If directed, put ice on the painful area. To do this: If you have a removeable splint or brace, remove it as told by your health care provider. Put ice in a plastic bag. Place a towel between your skin and the bag or between the splint or brace and the bag. Leave the ice on for 20 minutes, 2-3 times a day. Do not fall asleep with the cold pack on your skin. Remove the ice if your skin turns bright  red. This is very important. If you cannot feel pain, heat, or cold, you have a greater risk of damage to the area. Move your fingers often to reduce stiffness and swelling.   General instructions Take over-the-counter and prescription medicines only as told by your health care provider. Rest your wrist and hand from any activity that may be causing your pain. If your condition is work related, talk with your employer about changes that can be made, such as getting a wrist pad to use while typing. Do any exercises as told by your health care provider, physical therapist, or occupational therapist. Keep all follow-up visits. This is important. Contact a health care provider if: You have new symptoms. Your pain is not controlled with medicines. Your symptoms get worse. Get help right away if: You have severe numbness or tingling in your  wrist or hand. Summary Carpal tunnel syndrome is a condition that causes pain, numbness, and weakness in your hand and fingers. It is usually caused by repeated wrist motions. Lifestyle changes and medicines are used to treat carpal tunnel syndrome. Surgery may be recommended. Follow your health care provider's instructions about wearing a splint, resting from activity, keeping follow-up visits, and calling for help. This information is not intended to replace advice given to you by your health care provider. Make sure you discuss any questions you have with your health care provider. Document Revised: 05/30/2019 Document Reviewed: 05/30/2019 Elsevier Patient Education  2021 [place].  Keep all follow-up visits as told by your health care provider. This is important. Contact a health care provider if:  The pain gets worse.  You have a fever. Get help right away if:  You develop severe joint pain, swelling, or redness.  Many joints become painful and swollen.  You develop severe back pain.  You develop severe weakness in your leg.  You cannot control your bladder or bowels. Summary  Arthritis is a term that is commonly used to refer to joint pain or joint disease. There are more than 100 types of arthritis.  The most common cause of this condition is wear and tear of a joint. Other causes include gout, inflammation or infection of the joint, sprains, or allergies.  Symptoms of this condition include redness, swelling, or stiffness of the joint. Other symptoms include warmth, fever, or feeling ill.  This condition is treated with rest, elevation, medicines, and applying cold or hot packs.  Follow your health care provider's instructions about medicines, activity, exercises, and other home care treatments. This information is not intended to replace advice given to you by your health care provider. Make sure you discuss any questions you have with your health care  provider. Document Revised: 12/25/2017 Document Reviewed: 12/25/2017 Elsevier Patient Education  2021 [employer].

## 2020-03-31 ENCOUNTER — Encounter: Payer: Self-pay | Admitting: Family Medicine

## 2020-03-31 NOTE — Telephone Encounter (Signed)
Was seen 02/21 and given predniSONE (DELTASONE) 20 MG tablet. Please advise if she needs to come back in.

## 2020-04-07 ENCOUNTER — Inpatient Hospital Stay: Admission: RE | Admit: 2020-04-07 | Payer: PPO | Source: Ambulatory Visit

## 2020-04-20 ENCOUNTER — Other Ambulatory Visit: Payer: Self-pay | Admitting: Nurse Practitioner

## 2020-04-20 ENCOUNTER — Ambulatory Visit (INDEPENDENT_AMBULATORY_CARE_PROVIDER_SITE_OTHER): Payer: PPO | Admitting: Nurse Practitioner

## 2020-04-20 ENCOUNTER — Other Ambulatory Visit: Payer: Self-pay

## 2020-04-20 ENCOUNTER — Encounter: Payer: Self-pay | Admitting: Nurse Practitioner

## 2020-04-20 ENCOUNTER — Ambulatory Visit (INDEPENDENT_AMBULATORY_CARE_PROVIDER_SITE_OTHER): Payer: PPO

## 2020-04-20 VITALS — BP 143/56 | HR 65 | Temp 96.8°F | Resp 20 | Ht 61.0 in | Wt 162.0 lb

## 2020-04-20 DIAGNOSIS — M79641 Pain in right hand: Secondary | ICD-10-CM

## 2020-04-20 MED ORDER — TRIAMCINOLONE ACETONIDE 0.1 % EX CREA: TOPICAL_CREAM | CUTANEOUS | 0 refills | Status: DC

## 2020-04-20 MED ORDER — DICLOFENAC SODIUM 1 % EX GEL: 4.0000 g | Freq: Four times a day (QID) | CUTANEOUS | 3 refills | Status: AC

## 2020-04-20 NOTE — Patient Instructions (Signed)
Hand Pain Many things can cause hand pain. Some common causes are:  An injury.  Repeating the same movement with your hand over and over (overuse).  Osteoporosis.  Arthritis.  Lumps in the tendons or joints of the hand and wrist (ganglion cysts).  Nerve compression syndromes (carpal tunnel syndrome).  Inflammation of the tendons (tendinitis).  Infection. Follow these instructions at home: Pay attention to any changes in your symptoms. Take these actions to help with your discomfort: Managing pain, stiffness, and swelling  Take over-the-counter and prescription medicines only as told by your health care provider.  Wear a hand splint or support as told by your health care provider.  If directed, put ice on the affected area: ? Put ice in a plastic bag. ? Place a towel between your skin and the bag. ? Leave the ice on for 20 minutes, 2-3 times a day.   Activity  Take breaks from repetitive activity often.  Avoid activities that make your pain worse.  Minimize stress on your hands and wrists as much as possible.  Do stretches or exercises as told by your health care provider.  Do not do activities that make your pain worse. Contact a health care provider if:  Your pain does not get better after a few days of self-care.  Your pain gets worse.  Your pain affects your ability to do your daily activities. Get help right away if:  Your hand becomes warm, red, or swollen.  Your hand is numb or tingling.  Your hand is extremely swollen or deformed.  Your hand or fingers turn white or blue.  You cannot move your hand, wrist, or fingers. Summary  Many things can cause hand pain.  Contact your health care provider if your pain does not get better after a few days of self care.  Minimize stress on your hands and wrists as much as possible.  Do not do activities that make your pain worse. This information is not intended to replace advice given to you by your  health care provider. Make sure you discuss any questions you have with your health care provider. Document Revised: 10/13/2017 Document Reviewed: 10/13/2017 Elsevier Patient Education  2021 Else[employer] Inc.  

## 2020-04-20 NOTE — Progress Notes (Signed)
   Subjective:    Patient ID: Janice Zamora, female    DOB: 1935-05-02, 85 y.o.   MRN: [id number]   Chief Complaint: Right hand pain   HPI Patient come sin today c/o right hand pain. Mainly in palm of hand, she says it hurts to make a fist. She was seen on 03/23/20 with same complaint. She was given a short burst of steroids. That helped some. Pain started again on March 1,2022. Describes pain as being sore with shooting pain up into right thumb. She has numbness in the entire hand. She has taken a few doses of ibuprofen  Review of Systems  Musculoskeletal: Positive for arthralgias (right hand).  All other systems reviewed and are negative.      Objective:   Physical Exam Vitals reviewed.  Constitutional:      Appearance: Normal appearance.  Musculoskeletal:     Comments: No pain on palpation No edema Unable to make a full fist - but no pain No numbness right now  Skin:    General: Skin is warm.  Neurological:     General: No focal deficit present.     Mental Status: She is alert and oriented to person, place, and time.    BP (!) 143/56   Pulse 65   Temp (!) 96.8 F (36 C) (Temporal)   Resp 20   Ht 5\' 1"  (1.549 m)   Wt 162 lb (73.5 kg)   SpO2 100%   BMI 30.61 kg/m   Right hand xray- mild osteoarthritis of fingers-Preliminary reading by Ronnald Collum, FNP  [facility]       Assessment & Plan:  Janice Zamora in today with chief complaint of Right hand pain   1. Hand pain, right Moist heat or ice- which ever feels better Rest Follow up if no improvement - DG Hand Complete Right - diclofenac Sodium (VOLTAREN) 1 % GEL; Apply 4 g topically 4 (four) times daily.  Dispense: 350 g; Refill: 3    The above assessment and management plan was discussed with the patient. The patient verbalized understanding of and has agreed to the management plan. Patient is aware to call the clinic if symptoms persist or worsen. Patient is aware when to return to the clinic for a  follow-up visit. Patient educated on when it is appropriate to go to the emergency department.   Mary-Margaret Hassell Done, FNP

## 2020-05-13 ENCOUNTER — Other Ambulatory Visit: Payer: Self-pay | Admitting: Nurse Practitioner

## 2020-05-13 DIAGNOSIS — M79641 Pain in right hand: Secondary | ICD-10-CM

## 2020-05-13 NOTE — Progress Notes (Signed)
Re ortho

## 2020-05-28 ENCOUNTER — Ambulatory Visit
Admission: RE | Admit: 2020-05-28 | Discharge: 2020-05-28 | Disposition: A | Discharge status: Home / Self Care | Payer: PPO | Source: Ambulatory Visit | Attending: Nurse Practitioner | Admitting: Nurse Practitioner

## 2020-05-28 ENCOUNTER — Other Ambulatory Visit: Payer: Self-pay

## 2020-05-28 DIAGNOSIS — Z1231 Encounter for screening mammogram for malignant neoplasm of breast: Secondary | ICD-10-CM | POA: Diagnosis not present

## 2020-06-05 DIAGNOSIS — M79641 Pain in right hand: Secondary | ICD-10-CM | POA: Diagnosis not present

## 2020-06-08 ENCOUNTER — Encounter: Payer: Self-pay | Admitting: Nurse Practitioner

## 2020-06-11 ENCOUNTER — Other Ambulatory Visit: Payer: Self-pay

## 2020-06-11 ENCOUNTER — Ambulatory Visit (INDEPENDENT_AMBULATORY_CARE_PROVIDER_SITE_OTHER): Payer: PPO | Admitting: Nurse Practitioner

## 2020-06-11 ENCOUNTER — Encounter: Payer: Self-pay | Admitting: Nurse Practitioner

## 2020-06-11 VITALS — BP 139/63 | HR 61 | Temp 96.8°F | Resp 20 | Ht 61.0 in | Wt 159.0 lb

## 2020-06-11 DIAGNOSIS — M8588 Other specified disorders of bone density and structure, other site: Secondary | ICD-10-CM

## 2020-06-11 DIAGNOSIS — I10 Essential (primary) hypertension: Secondary | ICD-10-CM | POA: Diagnosis not present

## 2020-06-11 DIAGNOSIS — E663 Overweight: Secondary | ICD-10-CM | POA: Diagnosis not present

## 2020-06-11 DIAGNOSIS — E034 Atrophy of thyroid (acquired): Secondary | ICD-10-CM

## 2020-06-11 DIAGNOSIS — E782 Mixed hyperlipidemia: Secondary | ICD-10-CM

## 2020-06-11 DIAGNOSIS — K219 Gastro-esophageal reflux disease without esophagitis: Secondary | ICD-10-CM | POA: Diagnosis not present

## 2020-06-11 DIAGNOSIS — E8881 Metabolic syndrome: Secondary | ICD-10-CM

## 2020-06-11 DIAGNOSIS — F411 Generalized anxiety disorder: Secondary | ICD-10-CM

## 2020-06-11 LAB — BAYER DCA HB A1C WAIVED: HB A1C (BAYER DCA - WAIVED): 6.3 % (ref <7.0)

## 2020-06-11 MED ORDER — SIMVASTATIN 40 MG PO TABS: 40.0000 mg | ORAL_TABLET | Freq: Every day | ORAL | 1 refills | Status: DC

## 2020-06-11 MED ORDER — LORAZEPAM 0.5 MG PO TABS: 0.5000 mg | ORAL_TABLET | Freq: Two times a day (BID) | ORAL | 2 refills | Status: DC | PRN

## 2020-06-11 MED ORDER — ENALAPRIL-HYDROCHLOROTHIAZIDE 10-25 MG PO TABS: 1.0000 | ORAL_TABLET | Freq: Every day | ORAL | 1 refills | Status: DC

## 2020-06-11 MED ORDER — LEVOTHYROXINE SODIUM 75 MCG PO TABS
75.0000 ug | ORAL_TABLET | Freq: Every day | ORAL | 1 refills | Status: DC
Start: 2020-06-11 — End: 2020-12-14

## 2020-06-11 MED ORDER — OMEPRAZOLE 20 MG PO CPDR: 20.0000 mg | DELAYED_RELEASE_CAPSULE | Freq: Every day | ORAL | 1 refills | Status: DC

## 2020-06-11 MED ORDER — ATENOLOL 50 MG PO TABS: 50.0000 mg | ORAL_TABLET | Freq: Every day | ORAL | 1 refills | Status: DC

## 2020-06-11 NOTE — Progress Notes (Signed)
Subjective:    Patient ID: Janice Zamora, female    DOB: 1935-06-16, 85 y.o.   MRN: [id number]   Chief Complaint: medical management of chronic issues      HPI:  1. Essential hypertension No c/o chest pain, sob or headche. Does not check blood pressure at home BP Readings from Last 3 Encounters:  06/11/20 139/63  04/20/20 (!) 143/56  03/23/20 (!) 122/54      2. Mixed hyperlipidemia Does try to watch diet but does no exercise. Lab Results  Component Value Date   CHOL 160 12/12/2019   HDL 41 12/12/2019   LDLCALC 92 12/12/2019   TRIG 156 (H) 12/12/2019   CHOLHDL 3.9 12/12/2019     3. Metabolic syndrome Does not check blood sugars at home. Is aware to watch carbs in diet. Lab Results  Component Value Date   HGBA1C 6.2 12/11/2018     4. Hypothyroidism due to acquired atrophy of thyroid No problems that she is aware of. Lab Results  Component Value Date   TSH 3.340 12/12/2019     5. Gastroesophageal reflux disease without esophagitis Takes omeprazole daily and is doing well.   6. GAD (generalized anxiety disorder) Stays stressed. 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 Has ativan to take as needed  7. Osteopenia of lumbar spine Last dexascan was done 12/12/19 with t score of -0.1.  8. Overweight (BMI 25.0-29.9) No recent weight changes  Wt Readings from Last 3 Encounters:  06/11/20 159 lb (72.1 kg)  04/20/20 162 lb (73.5 kg)  03/23/20 162 lb 2 oz (73.5 kg)   BMI Readings from Last 3 Encounters:  06/11/20 30.04 kg/m  04/20/20 30.61 kg/m  03/23/20 30.63 kg/m     Outpatient Encounter Medications as of 06/11/2020  Medication Sig  . atenolol (TENORMIN) 50 MG tablet Take 1 tablet (50 mg total) by mouth daily.  . diclofenac Sodium (VOLTAREN) 1 % GEL Apply 4 g topically 4 (four) times daily.  . enalapril-hydrochlorothiazide (VASERETIC) 10-25 MG tablet Take 1 tablet by mouth daily.  Marland Kitchen levothyroxine (SYNTHROID) 75 MCG tablet Take 1 tablet (75 mcg total) by mouth  daily before breakfast.  . LORazepam (ATIVAN) 0.5 MG tablet Take 1 tablet (0.5 mg total) by mouth 2 (two) times daily as needed. for anxiety  . Multiple Vitamin (MULTIVITAMIN WITH MINERALS) TABS tablet Take 1 tablet by mouth daily.  Marland Kitchen omeprazole (PRILOSEC) 20 MG capsule Take 1 capsule (20 mg total) by mouth daily.  . simvastatin (ZOCOR) 40 MG tablet Take 1 tablet (40 mg total) by mouth at bedtime.  . triamcinolone (KENALOG) 0.1 % APPLY TO AFFECTED AREA DAILY FOR 1 WEEK THEN APPLY TO AFFECTED AREA EVERY OTHER DAY   No facility-administered encounter medications on file as of 06/11/2020.    Past Surgical History:  Procedure Laterality Date  . BREAST SURGERY     fibriotic cyst removed  . CHOLECYSTECTOMY      Family History  Problem Relation Age of Onset  . Hypertension Mother   . Hypertension Father   . Hyperlipidemia Sister   . Hyperlipidemia Brother   . Hyperlipidemia Sister   . Hyperlipidemia Sister   . Healthy Daughter   . Healthy Son   . Healthy Daughter   . Healthy Son   . Breast cancer Neg Hx     New complaints: Has carpal tunnel in right hand and she had an injection 1 week ago and it really helped.  Social history: Lives by herself and her family checks  on her daily.  Controlled substance contract: 12/13/19    Review of Systems  Constitutional: Negative for diaphoresis.  Eyes: Negative for pain.  Respiratory: Negative for shortness of breath.   Cardiovascular: Negative for chest pain, palpitations and leg swelling.  Gastrointestinal: Negative for abdominal pain.  Endocrine: Negative for polydipsia.  Skin: Negative for rash.  Neurological: Negative for dizziness, weakness and headaches.  Hematological: Does not bruise/bleed easily.  All other systems reviewed and are negative.      Objective:   Physical Exam Vitals and nursing note reviewed.  Constitutional:      General: She is not in acute distress.    Appearance: Normal appearance. She is  well-developed.  HENT:     Head: Normocephalic.     Nose: Nose normal.  Eyes:     Pupils: Pupils are equal, round, and reactive to light.  Neck:     Vascular: No carotid bruit or JVD.  Cardiovascular:     Rate and Rhythm: Normal rate and regular rhythm.     Heart sounds: Normal heart sounds.  Pulmonary:     Effort: Pulmonary effort is normal. No respiratory distress.     Breath sounds: Normal breath sounds. No wheezing or rales.  Chest:     Chest wall: No tenderness.  Abdominal:     General: Bowel sounds are normal. There is no distension or abdominal bruit.     Palpations: Abdomen is soft. There is no hepatomegaly, splenomegaly, mass or pulsatile mass.     Tenderness: There is no abdominal tenderness.  Musculoskeletal:        General: Normal range of motion.     Cervical back: Normal range of motion and neck supple.  Lymphadenopathy:     Cervical: No cervical adenopathy.  Skin:    General: Skin is warm and dry.  Neurological:     Mental Status: She is alert and oriented to person, place, and time.     Deep Tendon Reflexes: Reflexes are normal and symmetric.  Psychiatric:        Behavior: Behavior normal.        Thought Content: Thought content normal.        Judgment: Judgment normal.    BP 139/63   Pulse 61   Temp (!) 96.8 F (36 C) (Temporal)   Resp 20   Ht _0  (1.549 m)   Wt 159 lb (72.1 kg)   SpO2 98%   BMI 30.04 kg/m       Assessment & Plan:  Janice Zamora comes in today with chief complaint of Medical Management of Chronic Issues   Diagnosis and orders addressed:  1. Essential hypertension Low sodium diet - CBC with Differential/Platelet - CMP14+EGFR - atenolol (TENORMIN) 50 MG tablet; Take 1 tablet (50 mg total) by mouth daily.  Dispense: 90 tablet; Refill: 1 - enalapril-hydrochlorothiazide (VASERETIC) 10-25 MG tablet; Take 1 tablet by mouth daily.  Dispense: 90 tablet; Refill: 1  2. Mixed hyperlipidemia Low fat diet - Lipid panel -  simvastatin (ZOCOR) 40 MG tablet; Take 1 tablet (40 mg total) by mouth at bedtime.  Dispense: 90 tablet; Refill: 1  3. Metabolic syndrome Watch carbs in diet - Bayer DCA Hb A1c Waived  4. Hypothyroidism due to acquired atrophy of thyroid Labs pending - Thyroid Panel With TSH - levothyroxine (SYNTHROID) 75 MCG tablet; Take 1 tablet (75 mcg total) by mouth daily before breakfast.  Dispense: 90 tablet; Refill: 1  5. Gastroesophageal reflux disease without esophagitis Avoid  spicy foods Do not eat 2 hours prior to bedtime - omeprazole (PRILOSEC) 20 MG capsule; Take 1 capsule (20 mg total) by mouth daily.  Dispense: 90 capsule; Refill: 1  6. GAD (generalized anxiety disorder) Stress management - LORazepam (ATIVAN) 0.5 MG tablet; Take 1 tablet (0.5 mg total) by mouth 2 (two) times daily as needed. for anxiety  Dispense: 30 tablet; Refill: 2  7. Osteopenia of lumbar spine Weight bearing exercise  8. Overweight (BMI 25.0-29.9) Discussed diet and exercise for person with BMI >25 Will recheck weight in 3-6 months   Labs pending Health Maintenance reviewed Diet and exercise encouraged  Follow up plan: 6 months   Mary-Margaret Hassell Done, FNP

## 2020-06-11 NOTE — Patient Instructions (Signed)

## 2020-06-12 LAB — THYROID PANEL WITH TSH
Free Thyroxine Index: 2.9 (ref 1.2–4.9)
T3 Uptake Ratio: 29 % (ref 24–39)
T4, Total: 10.1 ug/dL (ref 4.5–12.0)
TSH: 1.35 u[IU]/mL (ref 0.450–4.500)

## 2020-06-12 LAB — LIPID PANEL
Chol/HDL Ratio: 3.4 ratio (ref 0.0–4.4)
Cholesterol, Total: 167 mg/dL (ref 100–199)
HDL: 49 mg/dL (ref >39)
LDL Chol Calc (NIH): 90 mg/dL (ref 0–99)
Triglycerides: 160 mg/dL — ABNORMAL HIGH (ref 0–149)
VLDL Cholesterol Cal: 28 mg/dL (ref 5–40)

## 2020-06-12 LAB — CBC WITH DIFFERENTIAL/PLATELET
Basophils Absolute: 0.1 10*3/uL (ref 0.0–0.2)
Basos: 1 %
EOS (ABSOLUTE): 0.1 10*3/uL (ref 0.0–0.4)
Eos: 1 %
Hematocrit: 38.9 % (ref 34.0–46.6)
Hemoglobin: 13.6 g/dL (ref 11.1–15.9)
Immature Grans (Abs): 0.1 10*3/uL (ref 0.0–0.1)
Immature Granulocytes: 1 %
Lymphocytes Absolute: 2.5 10*3/uL (ref 0.7–3.1)
Lymphs: 32 %
MCH: 31.9 pg (ref 26.6–33.0)
MCHC: 35 g/dL (ref 31.5–35.7)
MCV: 91 fL (ref 79–97)
Monocytes Absolute: 0.7 10*3/uL (ref 0.1–0.9)
Monocytes: 9 %
Neutrophils Absolute: 4.4 10*3/uL (ref 1.4–7.0)
Neutrophils: 56 %
Platelets: 212 10*3/uL (ref 150–450)
RBC: 4.27 x10E6/uL (ref 3.77–5.28)
RDW: 13.1 % (ref 11.7–15.4)
WBC: 7.8 10*3/uL (ref 3.4–10.8)

## 2020-06-12 LAB — CMP14+EGFR
ALT: 10 IU/L (ref 0–32)
AST: 11 IU/L (ref 0–40)
Albumin/Globulin Ratio: 1.7 (ref 1.2–2.2)
Albumin: 4.4 g/dL (ref 3.6–4.6)
Alkaline Phosphatase: 77 IU/L (ref 44–121)
BUN/Creatinine Ratio: 24 (ref 12–28)
BUN: 24 mg/dL (ref 8–27)
Bilirubin Total: 0.5 mg/dL (ref 0.0–1.2)
CO2: 24 mmol/L (ref 20–29)
Calcium: 10.2 mg/dL (ref 8.7–10.3)
Chloride: 97 mmol/L (ref 96–106)
Creatinine, Ser: 1.01 mg/dL — ABNORMAL HIGH (ref 0.57–1.00)
Globulin, Total: 2.6 g/dL (ref 1.5–4.5)
Glucose: 135 mg/dL — ABNORMAL HIGH (ref 65–99)
Potassium: 3.7 mmol/L (ref 3.5–5.2)
Sodium: 137 mmol/L (ref 134–144)
Total Protein: 7 g/dL (ref 6.0–8.5)
eGFR: 55 mL/min/{1.73_m2} — ABNORMAL LOW (ref >59)

## 2020-07-17 DIAGNOSIS — G5601 Carpal tunnel syndrome, right upper limb: Secondary | ICD-10-CM | POA: Diagnosis not present

## 2020-07-17 DIAGNOSIS — M79641 Pain in right hand: Secondary | ICD-10-CM | POA: Diagnosis not present

## 2020-09-24 ENCOUNTER — Encounter: Payer: Self-pay | Admitting: Nurse Practitioner

## 2020-09-24 ENCOUNTER — Other Ambulatory Visit: Payer: Self-pay

## 2020-09-24 ENCOUNTER — Ambulatory Visit (INDEPENDENT_AMBULATORY_CARE_PROVIDER_SITE_OTHER): Payer: PPO | Admitting: Nurse Practitioner

## 2020-09-24 VITALS — BP 143/70 | HR 73 | Temp 97.3°F | Resp 20 | Ht 61.0 in | Wt 157.0 lb

## 2020-09-24 DIAGNOSIS — N898 Other specified noninflammatory disorders of vagina: Secondary | ICD-10-CM | POA: Diagnosis not present

## 2020-09-24 DIAGNOSIS — N904 Leukoplakia of vulva: Secondary | ICD-10-CM | POA: Diagnosis not present

## 2020-09-24 LAB — URINALYSIS, COMPLETE
Bilirubin, UA: NEGATIVE
Glucose, UA: NEGATIVE
Ketones, UA: NEGATIVE
Nitrite, UA: NEGATIVE
Protein,UA: NEGATIVE
RBC, UA: NEGATIVE
Specific Gravity, UA: 1.02 (ref 1.005–1.030)
Urobilinogen, Ur: 1 mg/dL (ref 0.2–1.0)
pH, UA: 6 (ref 5.0–7.5)

## 2020-09-24 LAB — MICROSCOPIC EXAMINATION: RBC, Urine: NONE SEEN /hpf (ref 0–2)

## 2020-09-24 MED ORDER — CLOBETASOL PROPIONATE 0.05 % EX CREA
1.0000 | TOPICAL_CREAM | Freq: Two times a day (BID) | CUTANEOUS | 0 refills | Status: DC
Start: 2020-09-24 — End: 2020-12-14

## 2020-09-24 NOTE — Patient Instructions (Signed)
Lichen Sclerosus Lichen sclerosus is a skin problem. It can happen on any part of the body. It happens most often in the areas around the anus or the genitals. It can cause itching and discomfort. Treatment can help to control symptoms. It can alsohelp prevent scarring that may lead to other problems. What are the causes? The cause of this condition is not known. It is not passed from one person toanother. What increases the risk? Being a woman who has reached menopause. Being a man who was not circumcised. Being a child who is about to reach puberty. What are the signs or symptoms? Symptoms of this condition include: Thin, wrinkled, white areas on the skin. Thickened white areas on the skin. Red and swollen patches on the skin. Tears or cracks in the skin. Bruising. Blood blisters. Very bad itching. Pain, itching, or burning when peeing (urinating). Children are also most likely to have trouble pooping (constipation). Some adults too can have trouble pooping. How is this treated? This condition may be treated with: Creams or ointments (topical steroids) that are put on the skin in the affected areas. This is the most common treatment. Medicines that are taken by mouth. Topical immunotherapy. This is putting creams and ointments on the affected area. The creams and ointments will make your immunity strong in order to fight the infection. This is needed if steroids have not helped. Surgery. This is only needed if the condition is very bad and is causing problems such as scarring. Follow these instructions at home: Medicines Take over-the-counter and prescription medicines only as told by your health care provider. Use creams or ointments as told by your doctor. Skin care Do not scratch the affected areas of skin. If you are a woman, keep the vagina as clean and dry as you can. Clean the affected area of skin gently with water only. Avoid using rough towels or toilet paper. Avoid  products that irritate the skin. These include soap and scented lotions. Your doctor will tell you what creams to use to treat itching. General instructions Keep all follow-up visits. You may need to take these actions to prevent or treat trouble pooping: Drink enough fluid to keep your pee (urine) pale yellow. Take over-the-counter or prescription medicines. Eat foods that are high in fiber. These include beans, whole grains, and fresh fruits and vegetables. Limit foods that are high in fat and sugar. These include fried or sweet foods. Contact a doctor if: Your redness, swelling, or pain gets worse. You have fluid, blood, or pus coming from the area. You have new red and swollen patches on your skin. You have a fever. You have pain during sex. Get help right away if: You have very bad pain or burning in the affected areas, especially in the area around your vagina, penis, or anus. Summary Lichen sclerosus is a skin problem. It can cause itching and discomfort. This condition is usually treated with creams or ointments that are put on the skin in the affected areas. Use medicines only as told by your doctor. Do not scratch the affected areas of skin. Keep all follow-up visits. This information is not intended to replace advice given to you by your health care provider. Make sure you discuss any questions you have with your healthcare provider. Document Revised: 06/01/2019 Document Reviewed: 06/01/2019 Elsevier Patient Education  H. Rivera Colon.

## 2020-09-24 NOTE — Progress Notes (Signed)
   Subjective:    Patient ID: Janice Zamora, female    DOB: [birth year], 85 y.o.   MRN: [id number]   Chief Complaint: vaginal irritation   HPI  Patient comes in c/o vaginal and perineal itching. Has been going on for months. She has tried everything OTC and nothing really helps. She has good days and bad days. She has a dx of lichen sclerosus of vilva.   Review of Systems  Constitutional: Negative.   Respiratory: Negative.    Cardiovascular: Negative.   Genitourinary:  Negative for dysuria, frequency, urgency, vaginal bleeding, vaginal discharge and vaginal pain.  All other systems reviewed and are negative.     Objective:   Physical Exam Vitals and nursing note reviewed.  Constitutional:      Appearance: Normal appearance.  Cardiovascular:     Rate and Rhythm: Normal rate and regular rhythm.     Heart sounds: Normal heart sounds.  Pulmonary:     Effort: Pulmonary effort is normal.     Breath sounds: Normal breath sounds.  Genitourinary:    Comments: Hx lichen sclerosus  Skin:    General: Skin is warm.  Neurological:     General: No focal deficit present.     Mental Status: She is alert and oriented to person, place, and time.    BP (!) 143/70   Pulse 73   Temp (!) 97.3 F (36.3 C) (Temporal)   Resp 20   Ht '5\' 1"'$  (1.549 m)   Wt 157 lb (71.2 kg)   SpO2 97%   BMI 29.66 kg/m   Urine clear      Assessment & Plan:   Janice Zamora in today with chief complaint of vaginal irritation   1. Vaginal irritation - Urinalysis, Complete  2. Lichen sclerosus of vulva Avoid bubble baths Mild soap- dove Try to avoid scratching  Meds ordered this encounter  Medications   clobetasol cream (TEMOVATE) 0.05 %    Sig: Apply 1 application topically 2 (two) times daily.    Dispense:  30 g    Refill:  0    Order Specific Question:   Supervising Provider    Answer:   Caryl Pina A [id number]       The above assessment and management plan was discussed  with the patient. The patient verbalized understanding of and has agreed to the management plan. Patient is aware to call the clinic if symptoms persist or worsen. Patient is aware when to return to the clinic for a follow-up visit. Patient educated on when it is appropriate to go to the emergency department.   Mary-Margaret Hassell Done, FNP

## 2020-09-28 NOTE — Telephone Encounter (Signed)
No the only thing she can do is the old triamcinolone cream  and monistat OTC

## 2020-11-15 IMAGING — MG DIGITAL SCREENING BILAT W/ TOMO W/ CAD
8 series · 9 of 24 positions shown · non-contrast
Comparison: Previous exam(s).

CLINICAL DATA: Screening.

EXAM:
DIGITAL SCREENING BILATERAL MAMMOGRAM WITH TOMO AND CAD

[L CC synth-2D]
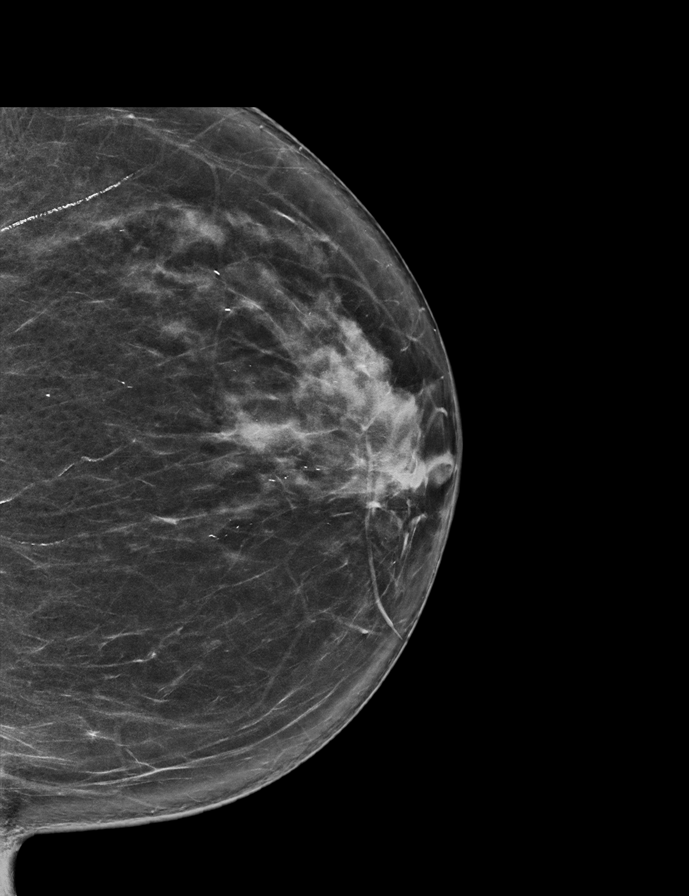

[R CC synth-2D]
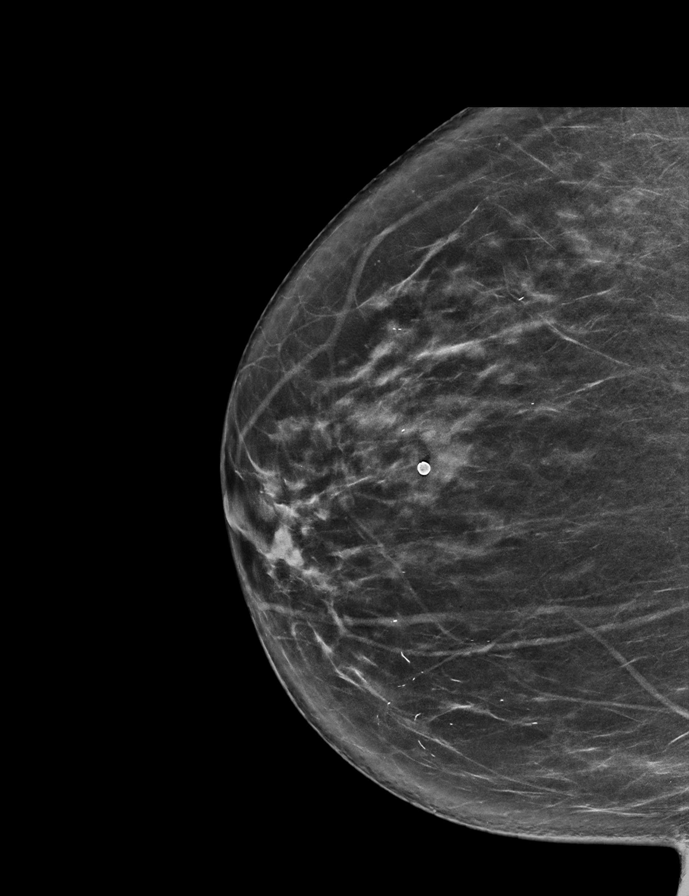

[R MLO synth-2D]
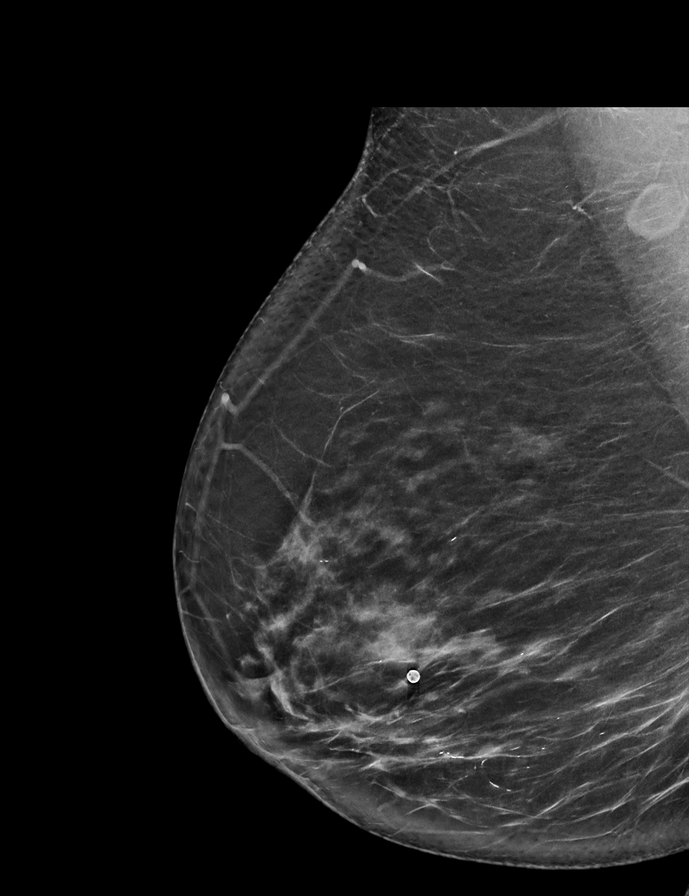

[L MLO synth-2D]
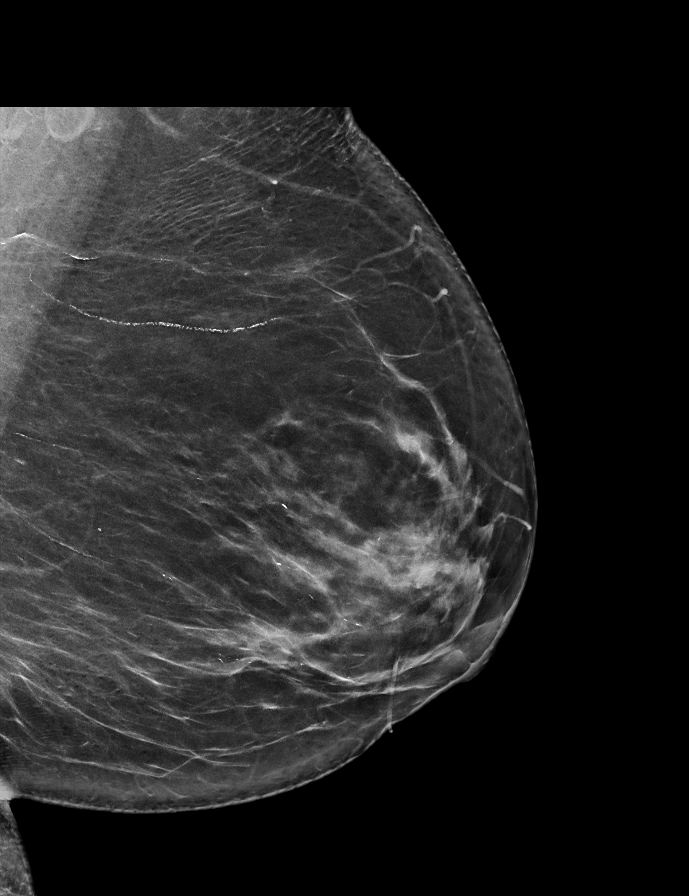

[R CC tomo · 2 of 67 frames shown]
[frame 22/67]
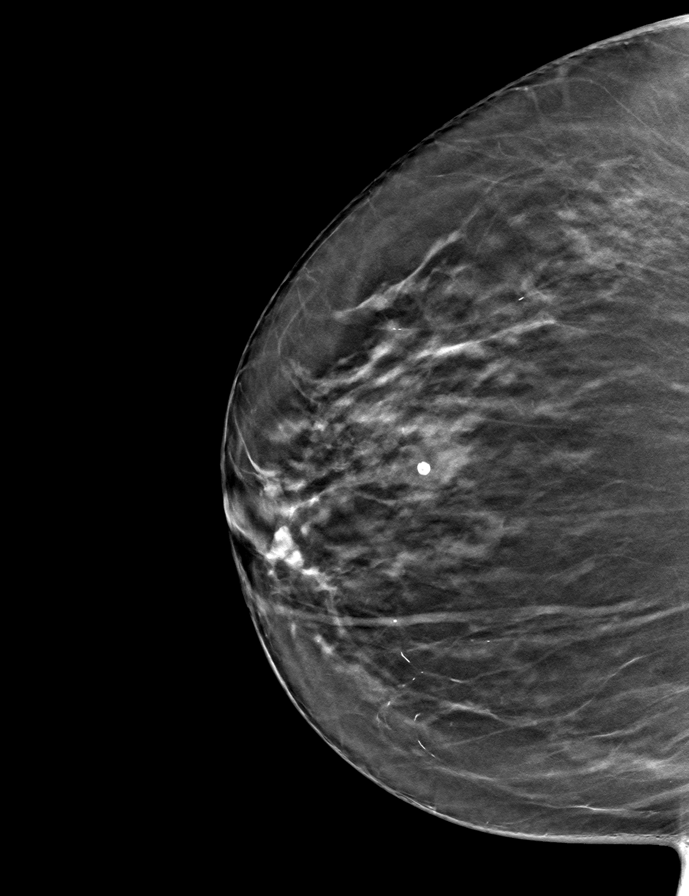
[frame 34/67]
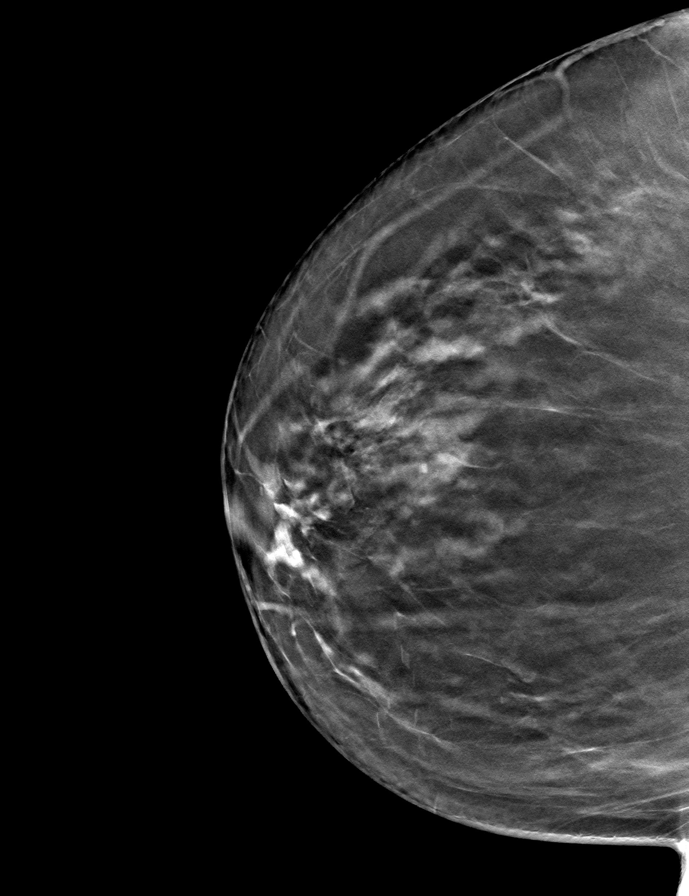

[L CC tomo · tomo slice 35/70.0]
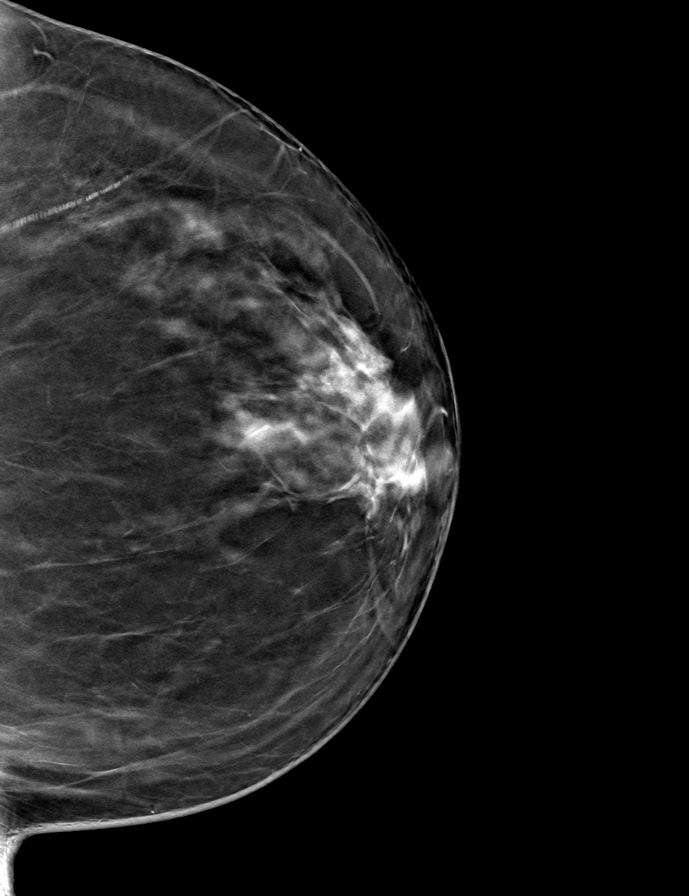

[L MLO tomo · tomo slice 40/79.0]
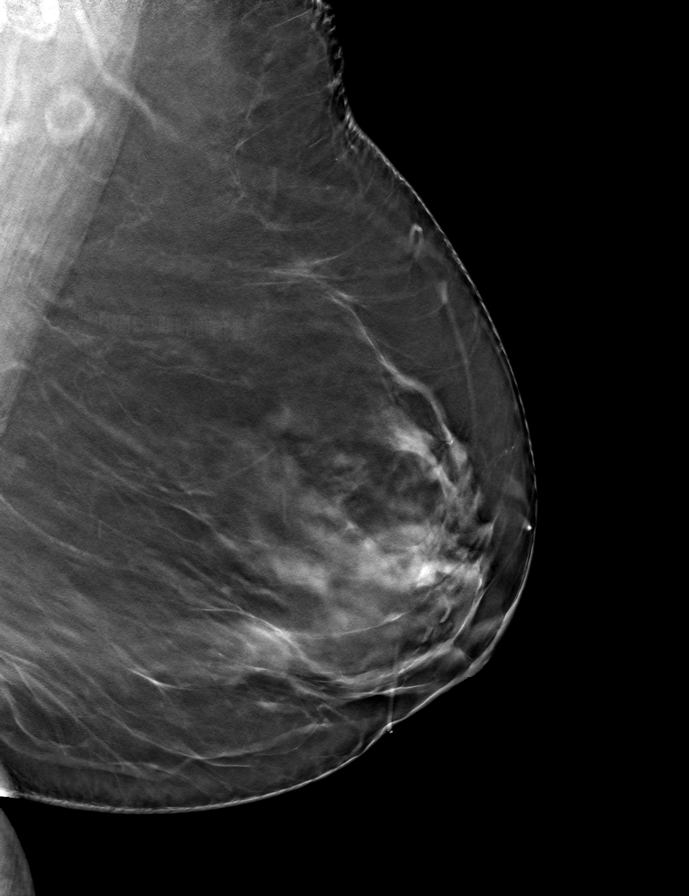

[R MLO tomo · tomo slice 39/77.0]
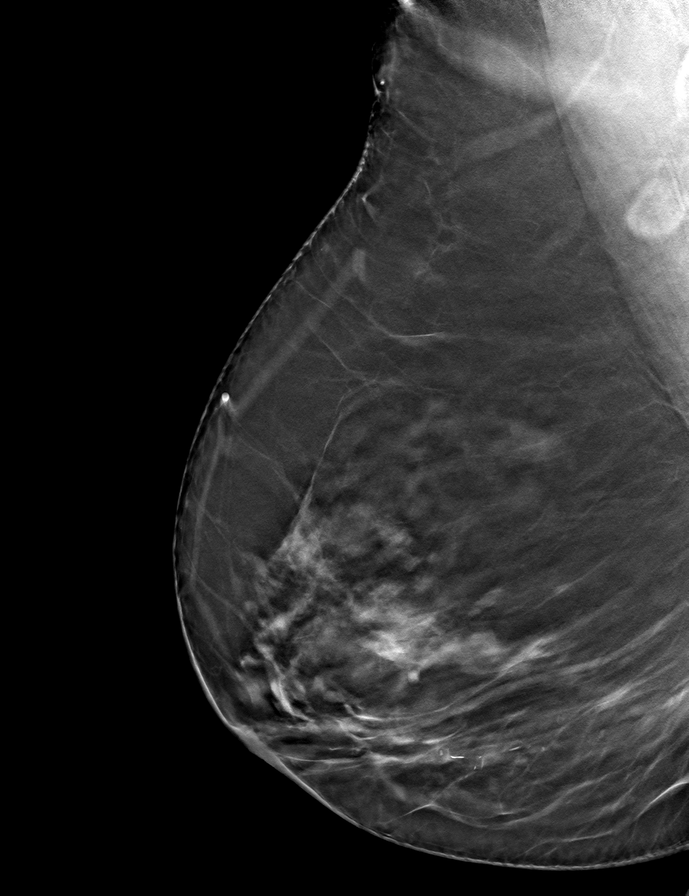

[9 of 24 positions shown; findings below may reference images not displayed]

ACR Breast Density Category c: The breast tissue is heterogeneously
dense, which may obscure small masses.
FINDINGS: In the left breast, a possible asymmetry warrants further
evaluation. In the right breast, no findings suspicious for
malignancy. Images were processed with CAD.
IMPRESSION: Further evaluation is suggested for possible asymmetry in the left
breast.

RECOMMENDATION:
Diagnostic mammogram and possibly ultrasound of the left breast.
(Code:F6-R-881)

The patient will be contacted regarding the findings, and additional
imaging will be scheduled.

BI-RADS CATEGORY  0: Incomplete. Need additional imaging evaluation
and/or prior mammograms for comparison.

## 2020-11-17 DIAGNOSIS — M25512 Pain in left shoulder: Secondary | ICD-10-CM | POA: Diagnosis not present

## 2020-11-17 DIAGNOSIS — M79642 Pain in left hand: Secondary | ICD-10-CM | POA: Diagnosis not present

## 2020-11-17 DIAGNOSIS — M545 Low back pain, unspecified: Secondary | ICD-10-CM | POA: Diagnosis not present

## 2020-12-14 ENCOUNTER — Other Ambulatory Visit: Payer: Self-pay

## 2020-12-14 ENCOUNTER — Telehealth: Payer: Self-pay | Admitting: *Deleted

## 2020-12-14 ENCOUNTER — Encounter: Payer: Self-pay | Admitting: Nurse Practitioner

## 2020-12-14 ENCOUNTER — Ambulatory Visit (INDEPENDENT_AMBULATORY_CARE_PROVIDER_SITE_OTHER): Payer: PPO | Admitting: Nurse Practitioner

## 2020-12-14 VITALS — BP 138/62 | HR 70 | Temp 97.7°F | Resp 20 | Ht 61.0 in | Wt 155.0 lb

## 2020-12-14 DIAGNOSIS — N904 Leukoplakia of vulva: Secondary | ICD-10-CM

## 2020-12-14 DIAGNOSIS — E8881 Metabolic syndrome: Secondary | ICD-10-CM

## 2020-12-14 DIAGNOSIS — M8588 Other specified disorders of bone density and structure, other site: Secondary | ICD-10-CM | POA: Diagnosis not present

## 2020-12-14 DIAGNOSIS — F411 Generalized anxiety disorder: Secondary | ICD-10-CM | POA: Diagnosis not present

## 2020-12-14 DIAGNOSIS — I10 Essential (primary) hypertension: Secondary | ICD-10-CM

## 2020-12-14 DIAGNOSIS — K219 Gastro-esophageal reflux disease without esophagitis: Secondary | ICD-10-CM | POA: Diagnosis not present

## 2020-12-14 DIAGNOSIS — E034 Atrophy of thyroid (acquired): Secondary | ICD-10-CM | POA: Diagnosis not present

## 2020-12-14 DIAGNOSIS — E782 Mixed hyperlipidemia: Secondary | ICD-10-CM | POA: Diagnosis not present

## 2020-12-14 DIAGNOSIS — E663 Overweight: Secondary | ICD-10-CM | POA: Diagnosis not present

## 2020-12-14 LAB — BAYER DCA HB A1C WAIVED: HB A1C (BAYER DCA - WAIVED): 6.2 % — ABNORMAL HIGH (ref 4.8–5.6)

## 2020-12-14 MED ORDER — LORAZEPAM 0.5 MG PO TABS
0.5000 mg | ORAL_TABLET | Freq: Two times a day (BID) | ORAL | 2 refills | Status: DC | PRN
Start: 2020-12-14 — End: 2021-06-24

## 2020-12-14 MED ORDER — TRIAMCINOLONE ACETONIDE 0.1 % EX CREA: 1.0000 "application " | TOPICAL_CREAM | Freq: Two times a day (BID) | CUTANEOUS | 0 refills | Status: AC

## 2020-12-14 MED ORDER — TRIAMCINOLONE ACETONIDE 0.5 % EX CREA: 1.0000 "application " | TOPICAL_CREAM | Freq: Three times a day (TID) | CUTANEOUS | 0 refills | Status: DC

## 2020-12-14 MED ORDER — ENALAPRIL-HYDROCHLOROTHIAZIDE 10-25 MG PO TABS: 1.0000 | ORAL_TABLET | Freq: Every day | ORAL | 1 refills | Status: DC

## 2020-12-14 MED ORDER — OMEPRAZOLE 20 MG PO CPDR: 20.0000 mg | DELAYED_RELEASE_CAPSULE | Freq: Every day | ORAL | 1 refills | Status: DC

## 2020-12-14 MED ORDER — ATENOLOL 50 MG PO TABS
50.0000 mg | ORAL_TABLET | Freq: Every day | ORAL | 1 refills | Status: DC
Start: 2020-12-14 — End: 2021-06-24

## 2020-12-14 MED ORDER — LEVOTHYROXINE SODIUM 75 MCG PO TABS: 75.0000 ug | ORAL_TABLET | Freq: Every day | ORAL | 1 refills | Status: DC

## 2020-12-14 MED ORDER — SIMVASTATIN 40 MG PO TABS: 40.0000 mg | ORAL_TABLET | Freq: Every day | ORAL | 1 refills | Status: DC

## 2020-12-14 NOTE — Progress Notes (Signed)
Subjective:    Patient ID: Janice Zamora, female    DOB: 11-07-1935, 85 y.o.   MRN: [id number]  Chief Complaint: medical management of chronic issues     HPI:  1. Essential hypertension No c/o chest pain, SOB or headache. Does not check blood pressure at home. BP Readings from Last 3 Encounters:  12/14/20 138/62  09/24/20 (!) 143/70  06/11/20 139/63      2. Mixed hyperlipidemia Does try to watch diet but does little to no exercise. Lab Results  Component Value Date   CHOL 167 06/11/2020   HDL 49 06/11/2020   LDLCALC 90 06/11/2020   TRIG 160 (H) 06/11/2020   CHOLHDL 3.4 06/11/2020     3. Gastroesophageal reflux disease without esophagitis Is on omperazole daily and is doing well.  4. Hypothyroidism due to acquired atrophy of thyroid No problems that she is aware of. Lab Results  Component Value Date   TSH 1.350 06/11/2020     5. Metabolic syndrome Does not check blood sugars at home. Lab Results  Component Value Date   HGBA1C 6.3 06/11/2020     6. Osteopenia of lumbar spine Last dexascan was done 12/12/19. Her t score was -0.1 which was normal.  7. GAD (generalized anxiety disorder) Patient is on ativan as needed. Does not take very often  8. Overweight (BMI 25.0-29.9) No recent weight changes Wt Readings from Last 3 Encounters:  12/14/20 155 lb (70.3 kg)  09/24/20 157 lb (71.2 kg)  06/11/20 159 lb (72.1 kg)   BMI Readings from Last 3 Encounters:  12/14/20 29.29 kg/m  09/24/20 29.66 kg/m  06/11/20 30.04 kg/m      Outpatient Encounter Medications as of 12/14/2020  Medication Sig   atenolol (TENORMIN) 50 MG tablet Take 1 tablet (50 mg total) by mouth daily.   clobetasol cream (TEMOVATE) 0.05 % Apply 1 application topically 2 (two) times daily.   diclofenac Sodium (VOLTAREN) 1 % GEL Apply 4 g topically 4 (four) times daily.   enalapril-hydrochlorothiazide (VASERETIC) 10-25 MG tablet Take 1 tablet by mouth daily.   levothyroxine  (SYNTHROID) 75 MCG tablet Take 1 tablet (75 mcg total) by mouth daily before breakfast.   LORazepam (ATIVAN) 0.5 MG tablet Take 1 tablet (0.5 mg total) by mouth 2 (two) times daily as needed. for anxiety   Multiple Vitamin (MULTIVITAMIN WITH MINERALS) TABS tablet Take 1 tablet by mouth daily.   omeprazole (PRILOSEC) 20 MG capsule Take 1 capsule (20 mg total) by mouth daily.   simvastatin (ZOCOR) 40 MG tablet Take 1 tablet (40 mg total) by mouth at bedtime.   No facility-administered encounter medications on file as of 12/14/2020.    Past Surgical History:  Procedure Laterality Date   BREAST SURGERY     fibriotic cyst removed   CHOLECYSTECTOMY     right hand injection      Family History  Problem Relation Age of Onset   Hypertension Mother    Hypertension Father    Hyperlipidemia Sister    Hyperlipidemia Brother    Hyperlipidemia Sister    Hyperlipidemia Sister    Healthy Daughter    Healthy Son    Healthy Daughter    Healthy Son    Breast cancer Neg Hx     New complaints: Lichen sclerosus has gotten some better but clobetasol cream was over  $95 dollaors. Would like something cheaper  Social history: Lives by herself and her daughters check on her daily  Controlled substance contract: 12/13/19  Review of Systems  Constitutional:  Negative for diaphoresis.  Eyes:  Negative for pain.  Respiratory:  Negative for shortness of breath.   Cardiovascular:  Negative for chest pain, palpitations and leg swelling.  Gastrointestinal:  Negative for abdominal pain.  Endocrine: Negative for polydipsia.  Skin:  Negative for rash.  Neurological:  Negative for dizziness, weakness and headaches.  Hematological:  Does not bruise/bleed easily.  All other systems reviewed and are negative.     Objective:   Physical Exam Vitals and nursing note reviewed.  Constitutional:      General: She is not in acute distress.    Appearance: Normal appearance. She is well-developed.   HENT:     Head: Normocephalic.     Right Ear: Tympanic membrane normal.     Left Ear: Tympanic membrane normal.     Nose: Nose normal.     Mouth/Throat:     Mouth: Mucous membranes are moist.  Eyes:     Pupils: Pupils are equal, round, and reactive to light.  Neck:     Vascular: No carotid bruit or JVD.  Cardiovascular:     Rate and Rhythm: Normal rate and regular rhythm.     Heart sounds: Normal heart sounds.  Pulmonary:     Effort: Pulmonary effort is normal. No respiratory distress.     Breath sounds: Normal breath sounds. No wheezing or rales.  Chest:     Chest wall: No tenderness.  Abdominal:     General: Bowel sounds are normal. There is no distension or abdominal bruit.     Palpations: Abdomen is soft. There is no hepatomegaly, splenomegaly, mass or pulsatile mass.     Tenderness: There is no abdominal tenderness.  Musculoskeletal:        General: Normal range of motion.     Cervical back: Normal range of motion and neck supple.  Lymphadenopathy:     Cervical: No cervical adenopathy.  Skin:    General: Skin is warm and dry.  Neurological:     Mental Status: She is alert and oriented to person, place, and time.     Deep Tendon Reflexes: Reflexes are normal and symmetric.  Psychiatric:        Behavior: Behavior normal.        Thought Content: Thought content normal.        Judgment: Judgment normal.    BP 138/62   Pulse 70   Temp 97.7 F (36.5 C) (Temporal)   Resp 20   Ht $R'5\' 1"'iI$  (1.549 m)   Wt 155 lb (70.3 kg)   SpO2 98%   BMI 29.29 kg/m        Assessment & Plan:   Janice Zamora comes in today with chief complaint of Medical Management of Chronic Issues   Diagnosis and orders addressed:  1. Essential hypertension Low sodium diet - atenolol (TENORMIN) 50 MG tablet; Take 1 tablet (50 mg total) by mouth daily.  Dispense: 90 tablet; Refill: 1 - enalapril-hydrochlorothiazide (VASERETIC) 10-25 MG tablet; Take 1 tablet by mouth daily.  Dispense: 90  tablet; Refill: 1 - CBC with Differential/Platelet - CMP14+EGFR  2. Mixed hyperlipidemia Low fat diet - simvastatin (ZOCOR) 40 MG tablet; Take 1 tablet (40 mg total) by mouth at bedtime.  Dispense: 90 tablet; Refill: 1 - Lipid panel  3. Gastroesophageal reflux disease without esophagitis Avoid spicy foods Do not eat 2 hours prior to bedtime - omeprazole (PRILOSEC) 20 MG capsule; Take 1 capsule (20 mg total) by mouth  daily.  Dispense: 90 capsule; Refill: 1  4. Hypothyroidism due to acquired atrophy of thyroid Labs pending - levothyroxine (SYNTHROID) 75 MCG tablet; Take 1 tablet (75 mcg total) by mouth daily before breakfast.  Dispense: 90 tablet; Refill: 1 - Thyroid Panel With TSH  5. Metabolic syndrome Low carb diet - Bayer DCA Hb A1c Waived  6. Osteopenia of lumbar spine Continue weight bearing exercises  7. GAD (generalized anxiety disorder) Stress manaegemt - LORazepam (ATIVAN) 0.5 MG tablet; Take 1 tablet (0.5 mg total) by mouth 2 (two) times daily as needed. for anxiety  Dispense: 30 tablet; Refill: 2  8. Overweight (BMI 25.0-29.9) Discussed diet and exercise for person with BMI >25 Will recheck weight in 3-6 months   9. Lichen sclerosus of vulva Avoid harsh soaps Avoid scratching - triamcinolone cream (KENALOG) 0.5 %; Apply 1 application topically 3 (three) times daily.  Dispense: 454 g; Refill: 0   Labs pending Health Maintenance reviewed Diet and exercise encouraged  Follow up plan: 3 months   Mary-Margaret Hassell Done, FNP

## 2020-12-14 NOTE — Telephone Encounter (Signed)
Fax from [facility]: Triamcinolone0.5 %, not available in a 454 g tube Tiamcinolone 0.1% cream does come in the 454 g tube Please advise

## 2020-12-15 LAB — CBC WITH DIFFERENTIAL/PLATELET
Basophils Absolute: 0.1 10*3/uL (ref 0.0–0.2)
Basos: 1 %
EOS (ABSOLUTE): 0.1 10*3/uL (ref 0.0–0.4)
Eos: 2 %
Hematocrit: 37.2 % (ref 34.0–46.6)
Hemoglobin: 12.7 g/dL (ref 11.1–15.9)
Immature Grans (Abs): 0.1 10*3/uL (ref 0.0–0.1)
Immature Granulocytes: 1 %
Lymphocytes Absolute: 1.6 10*3/uL (ref 0.7–3.1)
Lymphs: 22 %
MCH: 31.4 pg (ref 26.6–33.0)
MCHC: 34.1 g/dL (ref 31.5–35.7)
MCV: 92 fL (ref 79–97)
Monocytes Absolute: 0.7 10*3/uL (ref 0.1–0.9)
Monocytes: 9 %
Neutrophils Absolute: 4.8 10*3/uL (ref 1.4–7.0)
Neutrophils: 65 %
Platelets: 202 10*3/uL (ref 150–450)
RBC: 4.04 x10E6/uL (ref 3.77–5.28)
RDW: 12.3 % (ref 11.7–15.4)
WBC: 7.2 10*3/uL (ref 3.4–10.8)

## 2020-12-15 LAB — CMP14+EGFR
ALT: 14 IU/L (ref 0–32)
AST: 14 IU/L (ref 0–40)
Albumin/Globulin Ratio: 2.2 (ref 1.2–2.2)
Albumin: 4.3 g/dL (ref 3.6–4.6)
Alkaline Phosphatase: 68 IU/L (ref 44–121)
BUN/Creatinine Ratio: 20 (ref 12–28)
BUN: 19 mg/dL (ref 8–27)
Bilirubin Total: 0.5 mg/dL (ref 0.0–1.2)
CO2: 24 mmol/L (ref 20–29)
Calcium: 9.6 mg/dL (ref 8.7–10.3)
Chloride: 98 mmol/L (ref 96–106)
Creatinine, Ser: 0.95 mg/dL (ref 0.57–1.00)
Globulin, Total: 2 g/dL (ref 1.5–4.5)
Glucose: 149 mg/dL — ABNORMAL HIGH (ref 70–99)
Potassium: 4.1 mmol/L (ref 3.5–5.2)
Sodium: 139 mmol/L (ref 134–144)
Total Protein: 6.3 g/dL (ref 6.0–8.5)
eGFR: 59 mL/min/{1.73_m2} — ABNORMAL LOW (ref >59)

## 2020-12-15 LAB — LIPID PANEL
Chol/HDL Ratio: 3.7 ratio (ref 0.0–4.4)
Cholesterol, Total: 154 mg/dL (ref 100–199)
HDL: 42 mg/dL (ref >39)
LDL Chol Calc (NIH): 80 mg/dL (ref 0–99)
Triglycerides: 186 mg/dL — ABNORMAL HIGH (ref 0–149)
VLDL Cholesterol Cal: 32 mg/dL (ref 5–40)

## 2020-12-15 LAB — THYROID PANEL WITH TSH
Free Thyroxine Index: 3 (ref 1.2–4.9)
T3 Uptake Ratio: 30 % (ref 24–39)
T4, Total: 10.1 ug/dL (ref 4.5–12.0)
TSH: 1.07 u[IU]/mL (ref 0.450–4.500)

## 2021-01-13 ENCOUNTER — Ambulatory Visit (INDEPENDENT_AMBULATORY_CARE_PROVIDER_SITE_OTHER): Payer: PPO

## 2021-01-13 VITALS — Ht 61.0 in | Wt 155.0 lb

## 2021-01-13 DIAGNOSIS — Z0000 Encounter for general adult medical examination without abnormal findings: Secondary | ICD-10-CM | POA: Diagnosis not present

## 2021-01-13 NOTE — Patient Instructions (Signed)
Janice Zamora , Thank you for taking time to come for your Medicare Wellness Visit. I appreciate your ongoing commitment to your health goals. Please review the following plan we discussed and let me know if I can assist you in the future.   Screening recommendations/referrals: Colonoscopy: Done 04/17/2012 - no repeat  Mammogram: done 05/28/2020 - Repeat annually  Bone Density: Done 12/12/2019 - Repeat every 2 years  Recommended yearly ophthalmology/optometry visit for glaucoma screening and checkup Recommended yearly dental visit for hygiene and checkup  Vaccinations: Influenza vaccine: Done 10/29/2020 - Repeat annually  Pneumococcal vaccine: Done 06/01/2002 & 09/08/2014 Tdap vaccine: Done 09/14/2015 - Repeat in 10 years Shingles vaccine: Due   Covid-19: Done 03/08/2019, 04/09/2019, & 01/29/2020  Advanced directives: Please bring a copy of your health care power of attorney and living will to the office to be added to your chart at your convenience.   Conditions/risks identified: Aim for 30 minutes of exercise or brisk walking each day, drink 6-8 glasses of water and eat lots of fruits and vegetables.   Next appointment: Follow up in one year for your annual wellness visit    Preventive Care 65 Years and Older, Female Preventive care refers to lifestyle choices and visits with your health care provider that can promote health and wellness. What does preventive care include? A yearly physical exam. This is also called an annual well check. Dental exams once or twice a year. Routine eye exams. Ask your health care provider how often you should have your eyes checked. Personal lifestyle choices, including: Daily care of your teeth and gums. Regular physical activity. Eating a healthy diet. Avoiding tobacco and drug use. Limiting alcohol use. Practicing safe sex. Taking low-dose aspirin every day. Taking vitamin and mineral supplements as recommended by your health care provider. What happens  during an annual well check? The services and screenings done by your health care provider during your annual well check will depend on your age, overall health, lifestyle risk factors, and family history of disease. Counseling  Your health care provider may ask you questions about your: Alcohol use. Tobacco use. Drug use. Emotional well-being. Home and relationship well-being. Sexual activity. Eating habits. History of falls. Memory and ability to understand (cognition). Work and work Statistician. Reproductive health. Screening  You may have the following tests or measurements: Height, weight, and BMI. Blood pressure. Lipid and cholesterol levels. These may be checked every 5 years, or more frequently if you are over 9 years old. Skin check. Lung cancer screening. You may have this screening every year starting at age 19 if you have a 30-pack-year history of smoking and currently smoke or have quit within the past 15 years. Fecal occult blood test (FOBT) of the stool. You may have this test every year starting at age 34. Flexible sigmoidoscopy or colonoscopy. You may have a sigmoidoscopy every 5 years or a colonoscopy every 10 years starting at age 25. Hepatitis C blood test. Hepatitis B blood test. Sexually transmitted disease (STD) testing. Diabetes screening. This is done by checking your blood sugar (glucose) after you have not eaten for a while (fasting). You may have this done every 1-3 years. Bone density scan. This is done to screen for osteoporosis. You may have this done starting at age 66. Mammogram. This may be done every 1-2 years. Talk to your health care provider about how often you should have regular mammograms. Talk with your health care provider about your test results, treatment options, and if necessary,  the need for more tests. Vaccines  Your health care provider may recommend certain vaccines, such as: Influenza vaccine. This is recommended every  year. Tetanus, diphtheria, and acellular pertussis (Tdap, Td) vaccine. You may need a Td booster every 10 years. Zoster vaccine. You may need this after age 4. Pneumococcal 13-valent conjugate (PCV13) vaccine. One dose is recommended after age 10. Pneumococcal polysaccharide (PPSV23) vaccine. One dose is recommended after age 49. Talk to your health care provider about which screenings and vaccines you need and how often you need them. This information is not intended to replace advice given to you by your health care provider. Make sure you discuss any questions you have with your health care provider. Document Released: 02/13/2015 Document Revised: 10/07/2015 Document Reviewed: 11/18/2014 Elsevier Interactive Patient Education  2017 [employer] Prevention in the Home Falls can cause injuries. They can happen to people of all ages. There are many things you can do to make your home safe and to help prevent falls. What can I do on the outside of my home? Regularly fix the edges of walkways and driveways and fix any cracks. Remove anything that might make you trip as you walk through a door, such as a raised step or threshold. Trim any bushes or trees on the path to your home. Use bright outdoor lighting. Clear any walking paths of anything that might make someone trip, such as rocks or tools. Regularly check to see if handrails are loose or broken. Make sure that both sides of any steps have handrails. Any raised decks and porches should have guardrails on the edges. Have any leaves, snow, or ice cleared regularly. Use sand or salt on walking paths during winter. Clean up any spills in your garage right away. This includes oil or grease spills. What can I do in the bathroom? Use night lights. Install grab bars by the toilet and in the tub and shower. Do not use towel bars as grab bars. Use non-skid mats or decals in the tub or shower. If you need to sit down in the shower, use a  plastic, non-slip stool. Keep the floor dry. Clean up any water that spills on the floor as soon as it happens. Remove soap buildup in the tub or shower regularly. Attach bath mats securely with double-sided non-slip rug tape. Do not have throw rugs and other things on the floor that can make you trip. What can I do in the bedroom? Use night lights. Make sure that you have a light by your bed that is easy to reach. Do not use any sheets or blankets that are too big for your bed. They should not hang down onto the floor. Have a firm chair that has side arms. You can use this for support while you get dressed. Do not have throw rugs and other things on the floor that can make you trip. What can I do in the kitchen? Clean up any spills right away. Avoid walking on wet floors. Keep items that you use a lot in easy-to-reach places. If you need to reach something above you, use a strong step stool that has a grab bar. Keep electrical cords out of the way. Do not use floor polish or wax that makes floors slippery. If you must use wax, use non-skid floor wax. Do not have throw rugs and other things on the floor that can make you trip. What can I do with my stairs? Do not leave any items on  the stairs. Make sure that there are handrails on both sides of the stairs and use them. Fix handrails that are broken or loose. Make sure that handrails are as long as the stairways. Check any carpeting to make sure that it is firmly attached to the stairs. Fix any carpet that is loose or worn. Avoid having throw rugs at the top or bottom of the stairs. If you do have throw rugs, attach them to the floor with carpet tape. Make sure that you have a light switch at the top of the stairs and the bottom of the stairs. If you do not have them, ask someone to add them for you. What else can I do to help prevent falls? Wear shoes that: Do not have high heels. Have rubber bottoms. Are comfortable and fit you  well. Are closed at the toe. Do not wear sandals. If you use a stepladder: Make sure that it is fully opened. Do not climb a closed stepladder. Make sure that both sides of the stepladder are locked into place. Ask someone to hold it for you, if possible. Clearly mark and make sure that you can see: Any grab bars or handrails. First and last steps. Where the edge of each step is. Use tools that help you move around (mobility aids) if they are needed. These include: Canes. Walkers. Scooters. Crutches. Turn on the lights when you go into a dark area. Replace any light bulbs as soon as they burn out. Set up your furniture so you have a clear path. Avoid moving your furniture around. If any of your floors are uneven, fix them. If there are any pets around you, be aware of where they are. Review your medicines with your doctor. Some medicines can make you feel dizzy. This can increase your chance of falling. Ask your doctor what other things that you can do to help prevent falls. This information is not intended to replace advice given to you by your health care provider. Make sure you discuss any questions you have with your health care provider. Document Released: 11/13/2008 Document Revised: 06/25/2015 Document Reviewed: 02/21/2014 Elsevier Interactive Patient Education  2017 [employer].

## 2021-01-13 NOTE — Progress Notes (Signed)
Subjective:   Janice Zamora is a 85 y.o. female who presents for Medicare Annual (Subsequent) preventive examination.  Virtual Visit via Telephone Note  I connected with  Janice Zamora on 01/13/21 at  9:00 AM EST by telephone and verified that I am speaking with the correct person using two identifiers.  Location: Patient: Home Provider: [facility] Persons participating in the virtual visit: patient/Nurse Health Advisor   I discussed the limitations, risks, security and privacy concerns of performing an evaluation and management service by telephone and the availability of in person appointments. The patient expressed understanding and agreed to proceed.  Interactive audio and video telecommunications were attempted between this nurse and patient, however failed, due to patient having technical difficulties OR patient did not have access to video capability.  We continued and completed visit with audio only.  Some vital signs may be absent or patient reported.   Aikeem Lilley E Tatyana Biber, LPN   Review of Systems     Cardiac Risk Factors include: advanced age (>44men, >71 women);sedentary lifestyle;dyslipidemia;hypertension     Objective:    Today's Vitals   01/13/21 0923  Weight: 155 lb (70.3 kg)  Height: 5\' 1"  (1.549 m)   Body mass index is 29.29 kg/m.  Advanced Directives 01/13/2021 01/13/2020 08/10/2017 11/11/2016 01/19/2016 10/20/2014  Does Patient Have a Medical Advance Directive? Yes No No No No No  Type of Paramedic of [place];Living will - - - - -  Does patient want to make changes to medical advance directive? - - - No - Patient declined - -  Copy of [place] in Chart? No - copy requested - - - - -  Would patient like information on creating a medical advance directive? - No - Patient declined Yes (MAU/Ambulatory/Procedural Areas - Information given) No - Patient declined No - Patient declined Yes - Educational materials given     Current Medications (verified) Outpatient Encounter Medications as of 01/13/2021  Medication Sig   atenolol (TENORMIN) 50 MG tablet Take 1 tablet (50 mg total) by mouth daily.   diclofenac Sodium (VOLTAREN) 1 % GEL Apply 4 g topically 4 (four) times daily.   enalapril-hydrochlorothiazide (VASERETIC) 10-25 MG tablet Take 1 tablet by mouth daily.   levothyroxine (SYNTHROID) 75 MCG tablet Take 1 tablet (75 mcg total) by mouth daily before breakfast.   LORazepam (ATIVAN) 0.5 MG tablet Take 1 tablet (0.5 mg total) by mouth 2 (two) times daily as needed. for anxiety   Multiple Vitamin (MULTIVITAMIN WITH MINERALS) TABS tablet Take 1 tablet by mouth daily.   omeprazole (PRILOSEC) 20 MG capsule Take 1 capsule (20 mg total) by mouth daily.   simvastatin (ZOCOR) 40 MG tablet Take 1 tablet (40 mg total) by mouth at bedtime.   triamcinolone cream (KENALOG) 0.1 % Apply 1 application topically 2 (two) times daily.   No facility-administered encounter medications on file as of 01/13/2021.    Allergies (verified) Anesthesia [id number]-[id number] [propofol]   History: Past Medical History:  Diagnosis Date   Cataract    Cholelithiasis    Diverticulosis    Fibrocystic breast disease    GAD (generalized anxiety disorder)    GERD (gastroesophageal reflux disease)    Hyperlipidemia    Hypertension    Hypothyroidism    Metabolic syndrome    Osteopenia    Past Surgical History:  Procedure Laterality Date   BREAST SURGERY     fibriotic cyst removed   CHOLECYSTECTOMY     right hand  injection     Family History  Problem Relation Age of Onset   Hypertension Mother    Hypertension Father    Hyperlipidemia Sister    Hyperlipidemia Brother    Hyperlipidemia Sister    Hyperlipidemia Sister    Healthy Daughter    Healthy Son    Healthy Daughter    Healthy Son    Breast cancer Neg Hx    Social History   Socioeconomic History   Marital status: Widowed    Spouse name: Not on file   Number of children:  4   Years of education: 8   Highest education level: Not on file  Occupational History   Occupation: homemaker  Tobacco Use   Smoking status: Never   Smokeless tobacco: Never  Vaping Use   Vaping Use: Never used  Substance and Sexual Activity   Alcohol use: No   Drug use: No   Sexual activity: Not Currently    Birth control/protection: Post-menopausal  Other Topics Concern   Not on file  Social History Narrative   ** Merged History Encounter **       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 Maintains her own home and yard. Enjoys sewing (mask making).    Social Determinants of Health   Financial Resource Strain: Low Risk    Difficulty of Paying Living Expenses: Not hard at all  Food Insecurity: No Food Insecurity   Worried About Charity fundraiser in the Last Year: Never true   [place] in the Last Year: Never true  Transportation Needs: No Transportation Needs   Lack of Transportation (Medical): No   Lack of Transportation (Non-Medical): No  Physical Activity: Inactive   Days of Exercise per Week: 0 days   Minutes of Exercise per Session: 0 min  Stress: No Stress Concern Present   Feeling of Stress : Not at all  Social Connections: Moderately Integrated   Frequency of Communication with Friends and Family: More than three times a week   Frequency of Social Gatherings with Friends and Family: More than three times a week   Attends Religious Services: More than 4 times per year   Active Member of [employer] or Organizations: Yes   Attends Archivist Meetings: More than 4 times per year   Marital Status: Widowed    Tobacco Counseling Counseling given: Not Answered   Clinical Intake:  Pre-visit preparation completed: Yes  Pain : No/denies pain     BMI - recorded: 29.29 Nutritional Status: BMI 25 -29 Overweight Nutritional Risks: None Diabetes: No  How often do you need to have someone help you when you read instructions, pamphlets, or  other written materials from your doctor or pharmacy?: 1 - Never  Diabetic? no  Interpreter Needed?: No  Information entered by :: Zeppelin Commisso, LPN   Activities of Daily Living In your present state of health, do you have any difficulty performing the following activities: 01/13/2021  Hearing? N  Vision? N  Difficulty concentrating or making decisions? Y  Walking or climbing stairs? N  Dressing or bathing? N  Doing errands, shopping? N  Preparing Food and eating ? N  Using the Toilet? N  In the past six months, have you accidently leaked urine? N  Do you have problems with loss of bowel control? N  Managing your Medications? N  Managing your Finances? N  Housekeeping or managing your Housekeeping? N  Some recent data might be hidden    Patient  Care Team: Chevis Pretty, FNP as PCP - General (Nurse Practitioner) Irene Shipper, MD as Consulting Physician (Gastroenterology) Melina Schools, OD as Consulting Physician (Optometry)  Indicate any recent Medical Services you may have received from other than [facility] providers in the past year (date may be approximate).     Assessment:   This is a routine wellness examination for [place].  Hearing/Vision screen Hearing Screening - Comments:: Denies hearing difficulties  Vision Screening - Comments:: Wears rx glasses prn - up to date with annual eye exams at [place] issues and exercise activities discussed: Current Exercise Habits: The patient does not participate in regular exercise at present, Exercise limited by: None identified   Goals Addressed             This Visit's Progress    Exercise 3x per week (30 min per time)   Not on track    Walk for 30 minutes 3 time per week       Depression Screen PHQ 2/9 Scores 01/13/2021 12/14/2020 09/24/2020 06/11/2020 04/20/2020 01/13/2020 12/12/2019  PHQ - 2 Score 0 0 0 0 0 0 0  PHQ- 9 Score 0 0 - - - - -    Fall Risk Fall Risk  01/13/2021 12/14/2020  09/24/2020 06/11/2020 04/20/2020  Falls in the past year? 0 0 0 0 0  Number falls in past yr: 0 - - - -  Injury with Fall? 0 - - - -  Risk for fall due to : Medication side effect;Mental status change - - - -  Follow up Falls prevention discussed - - - -    FALL RISK PREVENTION PERTAINING TO THE HOME:  Any stairs in or around the home? Yes  If so, are there any without handrails? No  Home free of loose throw rugs in walkways, pet beds, electrical cords, etc? Yes  Adequate lighting in your home to reduce risk of falls? Yes   ASSISTIVE DEVICES UTILIZED TO PREVENT FALLS:  Life alert? No  Use of a cane, walker or w/c? No  Grab bars in the bathroom? Yes  Shower chair or bench in shower? Yes  Elevated toilet seat or a handicapped toilet? Yes   TIMED UP AND GO:  Was the test performed? No . Telephonic visit  Cognitive Function: MMSE - Mini Mental State Exam 08/10/2017 10/28/2015 10/20/2014  Orientation to time 5 5 5   Orientation to Place 5 5 5   Registration 3 3 3   Attention/ Calculation 4 5 -  Recall 2 1 3   Language- name 2 objects 2 2 2   Language- repeat 1 1 1   Language- follow 3 step command 3 3 3   Language- read & follow direction 1 1 1   Write a sentence 1 1 1   Copy design 1 1 1   Total score 28 28 -     6CIT Screen 01/13/2021 01/13/2020  What Year? 0 points 0 points  What month? 0 points 0 points  What time? 0 points 0 points  Count back from 20 0 points 0 points  Months in reverse 4 points 4 points  Repeat phrase 8 points 4 points  Total Score 12 8    Immunizations Immunization History  Administered Date(s) Administered   Fluad Quad(high Dose 65+) 12/11/2018, 12/12/2019   Influenza, High Dose Seasonal PF 11/16/2016, 12/11/2017   Influenza,inj,Quad PF,6+ Mos 11/02/2012, 10/29/2013, 11/11/2014, 10/22/2015   Influenza-Unspecified 10/29/2020   Moderna Sars-Covid-2 Vaccination 03/08/2019, 04/09/2019, 01/29/2020   Pneumococcal Conjugate-13 09/08/2014   Pneumococcal  Polysaccharide-23 06/01/2002   Tdap 09/14/2015    TDAP status: Up to date  Flu Vaccine status: Up to date  Pneumococcal vaccine status: Up to date  Covid-19 vaccine status: Completed vaccines  Qualifies for Shingles Vaccine? Yes   Zostavax completed Yes   Shingrix Completed?: No.    Education has been provided regarding the importance of this vaccine. Patient has been advised to call insurance company to determine out of pocket expense if they have not yet received this vaccine. Advised may also receive vaccine at local pharmacy or Health Dept. Verbalized acceptance and understanding.  Screening Tests Health Maintenance  Topic Date Due   Zoster Vaccines- Shingrix (1 of 2) Never done   COVID-19 Vaccine (4 - Booster for Moderna series) 03/25/2020   MAMMOGRAM  05/28/2021   DEXA SCAN  12/11/2021   TETANUS/TDAP  09/13/2025   Pneumonia Vaccine 45+ Years old  Completed   INFLUENZA VACCINE  Completed   HPV VACCINES  Aged Out    Health Maintenance  Health Maintenance Due  Topic Date Due   Zoster Vaccines- Shingrix (1 of 2) Never done   COVID-19 Vaccine (4 - Booster for Moderna series) 03/25/2020    Colorectal cancer screening: No longer required.   Mammogram status: Completed 05/28/2020. Repeat every year  Bone Density status: Completed 12/12/2019. Results reflect: Bone density results: OSTEOPENIA. Repeat every 2 years.  Lung Cancer Screening: (Low Dose CT Chest recommended if Age 9-80 years, 30 pack-year currently smoking OR have quit w/in 15years.) does not qualify.   Additional Screening:  Hepatitis C Screening: does not qualify  Vision Screening: Recommended annual ophthalmology exams for early detection of glaucoma and other disorders of the eye. Is the patient up to date with their annual eye exam?  Yes  Who is the provider or what is the name of the office in which the patient attends annual eye exams? [place] If pt is not established with a provider, would  they like to be referred to a provider to establish care? No .   Dental Screening: Recommended annual dental exams for proper oral hygiene  Community Resource Referral / Chronic Care Management: CRR required this visit?  No   CCM required this visit?  No      Plan:     I have personally reviewed and noted the following in the patients chart:   Medical and social history Use of alcohol, tobacco or illicit drugs  Current medications and supplements including opioid prescriptions.  Functional ability and status Nutritional status Physical activity Advanced directives List of other physicians Hospitalizations, surgeries, and ER visits in previous 12 months Vitals Screenings to include cognitive, depression, and falls Referrals and appointments  In addition, I have reviewed and discussed with patient certain preventive protocols, quality metrics, and best practice recommendations. A written personalized care plan for preventive services as well as general preventive health recommendations were provided to patient.     Sandrea Hammond, LPN   [id number]   Nurse Notes: None

## 2021-04-28 ENCOUNTER — Encounter: Payer: Self-pay | Admitting: Family Medicine

## 2021-04-28 ENCOUNTER — Ambulatory Visit (INDEPENDENT_AMBULATORY_CARE_PROVIDER_SITE_OTHER): Payer: PPO | Admitting: Family Medicine

## 2021-04-28 VITALS — BP 137/61 | HR 71 | Temp 98.3°F | Ht 61.0 in | Wt 159.4 lb

## 2021-04-28 DIAGNOSIS — J301 Allergic rhinitis due to pollen: Secondary | ICD-10-CM

## 2021-04-28 DIAGNOSIS — H938X2 Other specified disorders of left ear: Secondary | ICD-10-CM

## 2021-04-28 MED ORDER — LEVOCETIRIZINE DIHYDROCHLORIDE 5 MG PO TABS: 5.0000 mg | ORAL_TABLET | Freq: Every evening | ORAL | 2 refills | Status: AC

## 2021-04-28 NOTE — Patient Instructions (Signed)
Earache, Adult An earache, or ear pain, can be caused by many things, including: An infection. Ear wax buildup. Ear pressure. Something in the ear that should not be there (foreign body). A sore throat. Tooth problems. Jaw problems. Treatment of the earache will depend on the cause. If the cause is not clear or cannot be determined, you may need to watch your symptoms until your earache goes away or until a cause is found. Follow these instructions at home: Medicines Take or apply over-the-counter and prescription medicines only as told by your health care provider. If you were prescribed an antibiotic medicine, use it as told by your health care provider. Do not stop using the antibiotic even if you start to feel better. Do not put anything in your ear other than medicine that is prescribed by your health care provider. Managing pain If directed, apply heat to the affected area as often as told by your health care provider. Use the heat source that your health care provider recommends, such as a moist heat pack or a heating pad. Place a towel between your skin and the heat source. Leave the heat on for 20-30 minutes. Remove the heat if your skin turns bright red. This is especially important if you are unable to feel pain, heat, or cold. You may have a greater risk of getting burned. If directed, put ice on the affected area as often as told by your health care provider. To do this:   Put ice in a plastic bag. Place a towel between your skin and the bag. Leave the ice on for 20 minutes, 2-3 times a day. General instructions Pay attention to any changes in your symptoms. Try resting in an upright position instead of lying down. This may help to reduce pressure in your ear and relieve pain. Chew gum if it helps to relieve your ear pain. Treat any allergies as told by your health care provider. Drink enough fluid to keep your urine pale yellow. It is up to you to get the results of any  tests that were done. Ask your health care provider, or the department that is doing the tests, when your results will be ready. Keep all follow-up visits as told by your health care provider. This is important. Contact a health care provider if: Your pain does not improve within 2 days. Your earache gets worse. You have new symptoms. You have a fever. Get help right away if you: Have a severe headache. Have a stiff neck. Have trouble swallowing. Have redness or swelling behind your ear. Have fluid or blood coming from your ear. Have hearing loss. Feel dizzy. Summary An earache, or ear pain, can be caused by many things. Treatment of the earache will depend on the cause. Follow recommendations from your health care provider to treat your ear pain. If the cause is not clear or cannot be determined, you may need to watch your symptoms until your earache goes away or until a cause is found. Keep all follow-up visits as told by your health care provider. This is important. This information is not intended to replace advice given to you by your health care provider. Make sure you discuss any questions you have with your health care provider. Document Revised: 08/25/2018 Document Reviewed: 08/25/2018 Elsevier Patient Education  2022 [employer] Inc[employer]  

## 2021-04-28 NOTE — Progress Notes (Signed)
? ?Acute Office Visit ? ?Subjective:  ? ? Patient ID: Janice Zamora, female    DOB: 02-03-1935, 86 y.o.   MRN: [id number] ? ?Chief Complaint  ?Patient presents with  ? Otalgia  ? ? ?HPI ?Patient is in today for left ear pain x 5 days. The pain is only at night. It is sometimes a sharp pain. She does report a runny nose as well. She denies drainage, itching, hearing loss, fever, congestion, cough, sore throat, shortness of breath, or chest pain. She has tried sweet oil with some improvement.  ? ?Past Medical History:  ?Diagnosis Date  ? Cataract   ? Cholelithiasis   ? Diverticulosis   ? Fibrocystic breast disease   ? GAD (generalized anxiety disorder)   ? GERD (gastroesophageal reflux disease)   ? Hyperlipidemia   ? Hypertension   ? Hypothyroidism   ? Metabolic syndrome   ? Osteopenia   ? ? ?Past Surgical History:  ?Procedure Laterality Date  ? BREAST SURGERY    ? fibriotic cyst removed  ? CHOLECYSTECTOMY    ? right hand injection    ? ? ?Family History  ?Problem Relation Age of Onset  ? Hypertension Mother   ? Hypertension Father   ? Hyperlipidemia Sister   ? Hyperlipidemia Brother   ? Hyperlipidemia Sister   ? Hyperlipidemia Sister   ? Healthy Daughter   ? Healthy Son   ? Healthy Daughter   ? Healthy Son   ? Breast cancer Neg Hx   ? ? ?Social History  ? ?Socioeconomic History  ? Marital status: Widowed  ?  Spouse name: Not on file  ? Number of children: 4  ? Years of education: 8  ? Highest education level: Not on file  ?Occupational History  ? Occupation: homemaker  ?Tobacco Use  ? Smoking status: Never  ? Smokeless tobacco: Never  ?Vaping Use  ? Vaping Use: Never used  ?Substance and Sexual Activity  ? Alcohol use: No  ? Drug use: No  ? Sexual activity: Not Currently  ?  Birth control/protection: Post-menopausal  ?Other Topics Concern  ? Not on file  ?Social History Narrative  ? ** Merged History Encounter **  ?    ? 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 Maintains her own home and yard. Enjoys  sewing (mask making).   ? ?Social Determinants of Health  ? ?Financial Resource Strain: Low Risk   ? Difficulty of Paying Living Expenses: Not hard at all  ?Food Insecurity: No Food Insecurity  ? Worried About Charity fundraiser in the Last Year: Never true  ? Ran Out of Food in the Last Year: Never true  ?Transportation Needs: No Transportation Needs  ? Lack of Transportation (Medical): No  ? Lack of Transportation (Non-Medical): No  ?Physical Activity: Inactive  ? Days of Exercise per Week: 0 days  ? Minutes of Exercise per Session: 0 min  ?Stress: No Stress Concern Present  ? Feeling of Stress : Not at all  ?Social Connections: Moderately Integrated  ? Frequency of Communication with Friends and Family: More than three times a week  ? Frequency of Social Gatherings with Friends and Family: More than three times a week  ? Attends Religious Services: More than 4 times per year  ? Active Member of Clubs or Organizations: Yes  ? Attends Archivist Meetings: More than 4 times per year  ? Marital Status: Widowed  ?Intimate Partner Violence: Not At Risk  ? Fear of  Current or Ex-Partner: No  ? Emotionally Abused: No  ? Physically Abused: No  ? Sexually Abused: No  ? ? ?Outpatient Medications Prior to Visit  ?Medication Sig Dispense Refill  ? atenolol (TENORMIN) 50 MG tablet Take 1 tablet (50 mg total) by mouth daily. 90 tablet 1  ? diclofenac Sodium (VOLTAREN) 1 % GEL Apply 4 g topically 4 (four) times daily. 350 g 3  ? enalapril-hydrochlorothiazide (VASERETIC) 10-25 MG tablet Take 1 tablet by mouth daily. 90 tablet 1  ? levothyroxine (SYNTHROID) 75 MCG tablet Take 1 tablet (75 mcg total) by mouth daily before breakfast. 90 tablet 1  ? LORazepam (ATIVAN) 0.5 MG tablet Take 1 tablet (0.5 mg total) by mouth 2 (two) times daily as needed. for anxiety 30 tablet 2  ? Multiple Vitamin (MULTIVITAMIN WITH MINERALS) TABS tablet Take 1 tablet by mouth daily.    ? omeprazole (PRILOSEC) 20 MG capsule Take 1 capsule (20 mg  total) by mouth daily. 90 capsule 1  ? simvastatin (ZOCOR) 40 MG tablet Take 1 tablet (40 mg total) by mouth at bedtime. 90 tablet 1  ? triamcinolone cream (KENALOG) 0.1 % Apply 1 application topically 2 (two) times daily. 453.6 g 0  ? ?No facility-administered medications prior to visit.  ? ? ?Allergies  ?Allergen Reactions  ? Anesthesia S-I-40 [Propofol] Nausea And Vomiting  ? ? ?Review of Systems ?As per HPI.  ?   ?Objective:  ?  ?Physical Exam ?Vitals and nursing note reviewed.  ?Constitutional:   ?   General: She is not in acute distress. ?   Appearance: She is not ill-appearing, toxic-appearing or diaphoretic.  ?HENT:  ?   Head: Normocephalic and atraumatic.  ?   Right Ear: Ear canal and external ear normal. No drainage, swelling or tenderness. No middle ear effusion. There is no impacted cerumen. Tympanic membrane is not injected, scarred, perforated, erythematous, retracted or bulging.  ?   Left Ear: Ear canal and external ear normal. No drainage, swelling or tenderness. A middle ear effusion is present. There is no impacted cerumen. Tympanic membrane is not injected, scarred, perforated, erythematous, retracted or bulging.  ?   Nose: Rhinorrhea present.  ?   Mouth/Throat:  ?   Mouth: Mucous membranes are moist.  ?Eyes:  ?   Conjunctiva/sclera: Conjunctivae normal.  ?   Pupils: Pupils are equal, round, and reactive to light.  ?Pulmonary:  ?   Effort: Pulmonary effort is normal. No respiratory distress.  ?Skin: ?   General: Skin is warm and dry.  ?Neurological:  ?   Mental Status: She is alert and oriented to person, place, and time.  ?Psychiatric:     ?   Mood and Affect: Mood normal.     ?   Behavior: Behavior normal.  ? ? ?BP 137/61   Pulse 71   Temp 98.3 ?F (36.8 ?C) (Temporal)   Ht $R'5\' 1"'GG$  (1.549 m)   Wt 159 lb 6 oz (72.3 kg)   BMI 30.11 kg/m?  ?Wt Readings from Last 3 Encounters:  ?04/28/21 159 lb 6 oz (72.3 kg)  ?01/13/21 155 lb (70.3 kg)  ?12/14/20 155 lb (70.3 kg)  ? ? ?Health Maintenance Due   ?Topic Date Due  ? Zoster Vaccines- Shingrix (1 of 2) Never done  ? COVID-19 Vaccine (4 - Booster for Moderna series) 03/25/2020  ? ? ?There are no preventive care reminders to display for this patient. ? ? ?Lab Results  ?Component Value Date  ? TSH 1.070 12/14/2020  ? ?  Lab Results  ?Component Value Date  ? WBC 7.2 12/14/2020  ? HGB 12.7 12/14/2020  ? HCT 37.2 12/14/2020  ? MCV 92 12/14/2020  ? PLT 202 12/14/2020  ? ?Lab Results  ?Component Value Date  ? NA 139 12/14/2020  ? K 4.1 12/14/2020  ? CO2 24 12/14/2020  ? GLUCOSE 149 (H) 12/14/2020  ? BUN 19 12/14/2020  ? CREATININE 0.95 12/14/2020  ? BILITOT 0.5 12/14/2020  ? ALKPHOS 68 12/14/2020  ? AST 14 12/14/2020  ? ALT 14 12/14/2020  ? PROT 6.3 12/14/2020  ? ALBUMIN 4.3 12/14/2020  ? CALCIUM 9.6 12/14/2020  ? EGFR 59 (L) 12/14/2020  ? ?Lab Results  ?Component Value Date  ? CHOL 154 12/14/2020  ? ?Lab Results  ?Component Value Date  ? HDL 42 12/14/2020  ? ?Lab Results  ?Component Value Date  ? [place] 80 12/14/2020  ? ?Lab Results  ?Component Value Date  ? TRIG 186 (H) 12/14/2020  ? ?Lab Results  ?Component Value Date  ? CHOLHDL 3.7 12/14/2020  ? ?Lab Results  ?Component Value Date  ? HGBA1C 6.2 (H) 12/14/2020  ? ? ?   ?Assessment & Plan:  ? ?Eyanna was seen today for otalgia. ? ?Diagnoses and all orders for this visit: ? ?Seasonal allergic rhinitis due to pollen ?Congestion of left ear ?Xyzal as below. No signs of infection today. Return to office for new or worsening symptoms, or if symptoms persist.  ?-     levocetirizine (XYZAL) 5 MG tablet; Take 1 tablet (5 mg total) by mouth every evening. ? ?The patient indicates understanding of these issues and agrees with the plan. ? ?Gwenlyn Perking, FNP ? ?

## 2021-04-29 ENCOUNTER — Other Ambulatory Visit: Payer: Self-pay | Admitting: Nurse Practitioner

## 2021-04-29 DIAGNOSIS — Z1231 Encounter for screening mammogram for malignant neoplasm of breast: Secondary | ICD-10-CM

## 2021-06-01 ENCOUNTER — Ambulatory Visit
Admission: RE | Admit: 2021-06-01 | Discharge: 2021-06-01 | Disposition: A | Discharge status: Home / Self Care | Payer: PPO | Source: Ambulatory Visit | Attending: Nurse Practitioner | Admitting: Nurse Practitioner

## 2021-06-01 DIAGNOSIS — Z1231 Encounter for screening mammogram for malignant neoplasm of breast: Secondary | ICD-10-CM | POA: Diagnosis not present

## 2021-06-16 ENCOUNTER — Ambulatory Visit: Payer: PPO | Admitting: Nurse Practitioner

## 2021-06-24 ENCOUNTER — Ambulatory Visit (INDEPENDENT_AMBULATORY_CARE_PROVIDER_SITE_OTHER): Payer: PPO | Admitting: Nurse Practitioner

## 2021-06-24 ENCOUNTER — Encounter: Payer: Self-pay | Admitting: Nurse Practitioner

## 2021-06-24 VITALS — BP 136/66 | HR 68 | Temp 97.2°F | Resp 20 | Ht 62.0 in | Wt 158.0 lb

## 2021-06-24 DIAGNOSIS — F411 Generalized anxiety disorder: Secondary | ICD-10-CM | POA: Diagnosis not present

## 2021-06-24 DIAGNOSIS — I10 Essential (primary) hypertension: Secondary | ICD-10-CM | POA: Diagnosis not present

## 2021-06-24 DIAGNOSIS — E663 Overweight: Secondary | ICD-10-CM | POA: Diagnosis not present

## 2021-06-24 DIAGNOSIS — M8588 Other specified disorders of bone density and structure, other site: Secondary | ICD-10-CM

## 2021-06-24 DIAGNOSIS — K219 Gastro-esophageal reflux disease without esophagitis: Secondary | ICD-10-CM

## 2021-06-24 DIAGNOSIS — E8881 Metabolic syndrome: Secondary | ICD-10-CM

## 2021-06-24 DIAGNOSIS — E034 Atrophy of thyroid (acquired): Secondary | ICD-10-CM | POA: Diagnosis not present

## 2021-06-24 DIAGNOSIS — R739 Hyperglycemia, unspecified: Secondary | ICD-10-CM | POA: Diagnosis not present

## 2021-06-24 DIAGNOSIS — E782 Mixed hyperlipidemia: Secondary | ICD-10-CM

## 2021-06-24 LAB — CMP14+EGFR
ALT: 10 IU/L (ref 0–32)
AST: 16 IU/L (ref 0–40)
Albumin/Globulin Ratio: 1.8 (ref 1.2–2.2)
Albumin: 4.4 g/dL (ref 3.6–4.6)
Alkaline Phosphatase: 63 IU/L (ref 44–121)
BUN/Creatinine Ratio: 24 (ref 12–28)
BUN: 27 mg/dL (ref 8–27)
Bilirubin Total: <0.2 mg/dL (ref 0.0–1.2)
CO2: 22 mmol/L (ref 20–29)
Calcium: 9.9 mg/dL (ref 8.7–10.3)
Chloride: 105 mmol/L (ref 96–106)
Creatinine, Ser: 1.13 mg/dL — ABNORMAL HIGH (ref 0.57–1.00)
Globulin, Total: 2.4 g/dL (ref 1.5–4.5)
Glucose: 134 mg/dL — ABNORMAL HIGH (ref 70–99)
Potassium: 4.3 mmol/L (ref 3.5–5.2)
Sodium: 143 mmol/L (ref 134–144)
Total Protein: 6.8 g/dL (ref 6.0–8.5)
eGFR: 48 mL/min/{1.73_m2} — ABNORMAL LOW (ref >59)

## 2021-06-24 LAB — LIPID PANEL
Chol/HDL Ratio: 3.6 ratio (ref 0.0–4.4)
Cholesterol, Total: 142 mg/dL (ref 100–199)
HDL: 40 mg/dL (ref >39)
LDL Chol Calc (NIH): 77 mg/dL (ref 0–99)
Triglycerides: 143 mg/dL (ref 0–149)
VLDL Cholesterol Cal: 25 mg/dL (ref 5–40)

## 2021-06-24 LAB — CBC WITH DIFFERENTIAL/PLATELET
Basophils Absolute: 0.1 10*3/uL (ref 0.0–0.2)
Basos: 1 %
EOS (ABSOLUTE): 0.2 10*3/uL (ref 0.0–0.4)
Eos: 3 %
Hematocrit: 35.6 % (ref 34.0–46.6)
Hemoglobin: 12.4 g/dL (ref 11.1–15.9)
Immature Grans (Abs): 0 10*3/uL (ref 0.0–0.1)
Immature Granulocytes: 0 %
Lymphocytes Absolute: 1.9 10*3/uL (ref 0.7–3.1)
Lymphs: 28 %
MCH: 32 pg (ref 26.6–33.0)
MCHC: 34.8 g/dL (ref 31.5–35.7)
MCV: 92 fL (ref 79–97)
Monocytes Absolute: 0.7 10*3/uL (ref 0.1–0.9)
Monocytes: 9 %
Neutrophils Absolute: 4 10*3/uL (ref 1.4–7.0)
Neutrophils: 59 %
Platelets: 176 10*3/uL (ref 150–450)
RBC: 3.87 x10E6/uL (ref 3.77–5.28)
RDW: 12.4 % (ref 11.7–15.4)
WBC: 6.9 10*3/uL (ref 3.4–10.8)

## 2021-06-24 LAB — BAYER DCA HB A1C WAIVED: HB A1C (BAYER DCA - WAIVED): 6 % — ABNORMAL HIGH (ref 4.8–5.6)

## 2021-06-24 MED ORDER — OMEPRAZOLE 20 MG PO CPDR: 20.0000 mg | DELAYED_RELEASE_CAPSULE | Freq: Every day | ORAL | 1 refills | Status: DC

## 2021-06-24 MED ORDER — LORAZEPAM 0.5 MG PO TABS: 0.5000 mg | ORAL_TABLET | Freq: Two times a day (BID) | ORAL | 2 refills | Status: DC | PRN

## 2021-06-24 MED ORDER — ATENOLOL 50 MG PO TABS: 50.0000 mg | ORAL_TABLET | Freq: Every day | ORAL | 1 refills | Status: DC

## 2021-06-24 MED ORDER — ENALAPRIL-HYDROCHLOROTHIAZIDE 10-25 MG PO TABS: 1.0000 | ORAL_TABLET | Freq: Every day | ORAL | 1 refills | Status: DC

## 2021-06-24 MED ORDER — SIMVASTATIN 40 MG PO TABS: 40.0000 mg | ORAL_TABLET | Freq: Every day | ORAL | 1 refills | Status: DC

## 2021-06-24 MED ORDER — LEVOTHYROXINE SODIUM 75 MCG PO TABS: 75.0000 ug | ORAL_TABLET | Freq: Every day | ORAL | 1 refills | Status: DC

## 2021-06-24 NOTE — Patient Instructions (Signed)

## 2021-06-24 NOTE — Progress Notes (Signed)
Subjective:    Patient ID: Janice Zamora, female    DOB: 16-Mar-1935, 86 y.o.   MRN: [id number]   Chief Complaint: medical management of chronic issues     HPI:  Janice Zamora is a 86 y.o. who identifies as a female who was assigned female at birth.   Social history: Lives with: by herself- hr daughters check on her daily Work history: retired   Scientist, forensic in today for follow up of the following chronic medical issues:  1. Essential hypertension No c/o chest pain, sob or headache. Does not check blood pressure at home. BP Readings from Last 3 Encounters:  04/28/21 137/61  12/14/20 138/62  09/24/20 (!) 143/70     2. Mixed hyperlipidemia Does try to watch diet and does occasional exercises. Lab Results  Component Value Date   CHOL 154 12/14/2020   HDL 42 12/14/2020   LDLCALC 80 12/14/2020   TRIG 186 (H) 12/14/2020   CHOLHDL 3.7 12/14/2020     3. Gastroesophageal reflux disease without esophagitis Is on omperazole daily and is doing well.  4. Hypothyroidism due to acquired atrophy of thyroid No issues that aware of. Lab Results  Component Value Date   TSH 1.070 12/14/2020     5. Metabolic syndrome She does not check blood sugars at home. Not very strict on her diet. Lab Results  Component Value Date   HGBA1C 6.2 (H) 12/14/2020     6. GAD (generalized anxiety disorder) Is on ativan daily and is doing well.    06/24/2021    8:52 AM 12/14/2020    8:18 AM 06/11/2020    8:05 AM 12/12/2019    8:06 AM  GAD 7 : Generalized Anxiety Score  Nervous, Anxious, on Edge 0 0 0 0  Control/stop worrying 0 0 0 0  Worry too much - different things 0 0 0 0  Trouble relaxing 0 0 0 0  Restless 0 0 0 0  Easily annoyed or irritable 0 0 0 0  Afraid - awful might happen 0 0 0 1  Total GAD 7 Score 0 0 0 1  Anxiety Difficulty Not difficult at all Not difficult at all Not difficult at all Not difficult at all      7. Osteopenia of lumbar spine Last dexascan was done  on 12/12/19. Her t score was -0.1  8. Overweight (BMI 25.0-29.9) No recent weight changes Wt Readings from Last 3 Encounters:  06/24/21 158 lb (71.7 kg)  04/28/21 159 lb 6 oz (72.3 kg)  01/13/21 155 lb (70.3 kg)   BMI Readings from Last 3 Encounters:  06/24/21 28.90 kg/m  04/28/21 30.11 kg/m  01/13/21 29.29 kg/m     New complaints: None today  Allergies  Allergen Reactions   Anesthesia S-I-40 [Propofol] Nausea And Vomiting   Outpatient Encounter Medications as of 06/24/2021  Medication Sig   atenolol (TENORMIN) 50 MG tablet Take 1 tablet (50 mg total) by mouth daily.   diclofenac Sodium (VOLTAREN) 1 % GEL Apply 4 g topically 4 (four) times daily.   enalapril-hydrochlorothiazide (VASERETIC) 10-25 MG tablet Take 1 tablet by mouth daily.   levocetirizine (XYZAL) 5 MG tablet Take 1 tablet (5 mg total) by mouth every evening.   levothyroxine (SYNTHROID) 75 MCG tablet Take 1 tablet (75 mcg total) by mouth daily before breakfast.   LORazepam (ATIVAN) 0.5 MG tablet Take 1 tablet (0.5 mg total) by mouth 2 (two) times daily as needed. for anxiety   Multiple Vitamin (MULTIVITAMIN WITH  MINERALS) TABS tablet Take 1 tablet by mouth daily.   omeprazole (PRILOSEC) 20 MG capsule Take 1 capsule (20 mg total) by mouth daily.   simvastatin (ZOCOR) 40 MG tablet Take 1 tablet (40 mg total) by mouth at bedtime.   triamcinolone cream (KENALOG) 0.1 % Apply 1 application topically 2 (two) times daily.   No facility-administered encounter medications on file as of 06/24/2021.    Past Surgical History:  Procedure Laterality Date   BREAST BIOPSY Left    BREAST SURGERY     fibriotic cyst removed   CHOLECYSTECTOMY     right hand injection      Family History  Problem Relation Age of Onset   Hypertension Mother    Hypertension Father    Hyperlipidemia Sister    Hyperlipidemia Brother    Hyperlipidemia Sister    Hyperlipidemia Sister    Healthy Daughter    Healthy Son    Healthy Daughter     Healthy Son    Breast cancer Neg Hx       Controlled substance contract: n/a     Review of Systems  Constitutional:  Negative for diaphoresis.  Eyes:  Negative for pain.  Respiratory:  Negative for shortness of breath.   Cardiovascular:  Negative for chest pain, palpitations and leg swelling.  Gastrointestinal:  Negative for abdominal pain.  Endocrine: Negative for polydipsia.  Skin:  Negative for rash.  Neurological:  Negative for dizziness, weakness and headaches.  Hematological:  Does not bruise/bleed easily.  All other systems reviewed and are negative.     Objective:   Physical Exam Vitals and nursing note reviewed.  Constitutional:      General: She is not in acute distress.    Appearance: Normal appearance. She is well-developed.  HENT:     Head: Normocephalic.     Right Ear: Tympanic membrane normal.     Left Ear: Tympanic membrane normal.     Nose: Nose normal.     Mouth/Throat:     Mouth: Mucous membranes are moist.  Eyes:     Pupils: Pupils are equal, round, and reactive to light.  Neck:     Vascular: No carotid bruit or JVD.  Cardiovascular:     Rate and Rhythm: Normal rate and regular rhythm.     Heart sounds: Normal heart sounds.  Pulmonary:     Effort: Pulmonary effort is normal. No respiratory distress.     Breath sounds: Normal breath sounds. No wheezing or rales.  Chest:     Chest wall: No tenderness.  Abdominal:     General: Bowel sounds are normal. There is no distension or abdominal bruit.     Palpations: Abdomen is soft. There is no hepatomegaly, splenomegaly, mass or pulsatile mass.     Tenderness: There is no abdominal tenderness.  Musculoskeletal:        General: Normal range of motion.     Cervical back: Normal range of motion and neck supple.     Right lower leg: Edema (1+) present.     Left lower leg: Edema (1+) present.  Lymphadenopathy:     Cervical: No cervical adenopathy.  Skin:    General: Skin is warm and dry.   Neurological:     Mental Status: She is alert and oriented to person, place, and time.     Deep Tendon Reflexes: Reflexes are normal and symmetric.  Psychiatric:        Behavior: Behavior normal.  Thought Content: Thought content normal.        Judgment: Judgment normal.    BP 136/66   Pulse 68   Temp (!) 97.2 F (36.2 C) (Temporal)   Resp 20   Ht 5' 2" (1.575 m)   Wt 158 lb (71.7 kg)   SpO2 100%   BMI 28.90 kg/m   Hgba1c 6.0%     Assessment & Plan:   Janice Zamora comes in today with chief complaint of Medical Management of Chronic Issues   Diagnosis and orders addressed:  1. Essential hypertension Low sodium diet - CBC with Differential/Platelet - CMP14+EGFR - atenolol (TENORMIN) 50 MG tablet; Take 1 tablet (50 mg total) by mouth daily.  Dispense: 90 tablet; Refill: 1 - enalapril-hydrochlorothiazide (VASERETIC) 10-25 MG tablet; Take 1 tablet by mouth daily.  Dispense: 90 tablet; Refill: 1  2. Mixed hyperlipidemia Low fat iet - Lipid panel - simvastatin (ZOCOR) 40 MG tablet; Take 1 tablet (40 mg total) by mouth at bedtime.  Dispense: 90 tablet; Refill: 1  3. Gastroesophageal reflux disease without esophagitis Avoid spicy foods Do not eat 2 hours prior to bedtime - omeprazole (PRILOSEC) 20 MG capsule; Take 1 capsule (20 mg total) by mouth daily.  Dispense: 90 capsule; Refill: 1  4. Hypothyroidism due to acquired atrophy of thyroid Labs pending - levothyroxine (SYNTHROID) 75 MCG tablet; Take 1 tablet (75 mcg total) by mouth daily before breakfast.  Dispense: 90 tablet; Refill: 1  5. Metabolic syndrome Continue to watch carbs in diet - Bayer DCA Hb A1c Waived  6. GAD (generalized anxiety disorder) Stress manaegment - LORazepam (ATIVAN) 0.5 MG tablet; Take 1 tablet (0.5 mg total) by mouth 2 (two) times daily as needed. for anxiety  Dispense: 30 tablet; Refill: 2  7. Osteopenia of lumbar spine Weight bearing exercise  8. Overweight (BMI  25.0-29.9) Discussed diet and exercise for person with BMI >25 Will recheck weight in 3-6 months    Labs pending Health Maintenance reviewed Diet and exercise encouraged  Follow up plan: 6 months   Mary-Margaret Hassell Done, FNP

## 2021-12-27 ENCOUNTER — Encounter: Payer: Self-pay | Admitting: Nurse Practitioner

## 2021-12-27 ENCOUNTER — Ambulatory Visit (INDEPENDENT_AMBULATORY_CARE_PROVIDER_SITE_OTHER): Payer: PPO | Admitting: Nurse Practitioner

## 2021-12-27 VITALS — BP 137/58 | HR 64 | Temp 97.0°F | Resp 20 | Ht 62.0 in | Wt 158.0 lb

## 2021-12-27 DIAGNOSIS — E663 Overweight: Secondary | ICD-10-CM | POA: Diagnosis not present

## 2021-12-27 DIAGNOSIS — I10 Essential (primary) hypertension: Secondary | ICD-10-CM

## 2021-12-27 DIAGNOSIS — E8881 Metabolic syndrome: Secondary | ICD-10-CM | POA: Diagnosis not present

## 2021-12-27 DIAGNOSIS — E034 Atrophy of thyroid (acquired): Secondary | ICD-10-CM | POA: Diagnosis not present

## 2021-12-27 DIAGNOSIS — Z23 Encounter for immunization: Secondary | ICD-10-CM

## 2021-12-27 DIAGNOSIS — M8588 Other specified disorders of bone density and structure, other site: Secondary | ICD-10-CM | POA: Diagnosis not present

## 2021-12-27 DIAGNOSIS — E782 Mixed hyperlipidemia: Secondary | ICD-10-CM | POA: Diagnosis not present

## 2021-12-27 DIAGNOSIS — K219 Gastro-esophageal reflux disease without esophagitis: Secondary | ICD-10-CM

## 2021-12-27 DIAGNOSIS — R739 Hyperglycemia, unspecified: Secondary | ICD-10-CM | POA: Diagnosis not present

## 2021-12-27 DIAGNOSIS — F411 Generalized anxiety disorder: Secondary | ICD-10-CM | POA: Diagnosis not present

## 2021-12-27 LAB — BAYER DCA HB A1C WAIVED: HB A1C (BAYER DCA - WAIVED): 6.8 % — ABNORMAL HIGH (ref 4.8–5.6)

## 2021-12-27 MED ORDER — LEVOTHYROXINE SODIUM 75 MCG PO TABS: 75.0000 ug | ORAL_TABLET | Freq: Every day | ORAL | 1 refills | Status: DC

## 2021-12-27 MED ORDER — LORAZEPAM 0.5 MG PO TABS: 0.5000 mg | ORAL_TABLET | Freq: Two times a day (BID) | ORAL | 2 refills | Status: DC | PRN

## 2021-12-27 MED ORDER — SIMVASTATIN 40 MG PO TABS: 40.0000 mg | ORAL_TABLET | Freq: Every day | ORAL | 1 refills | Status: DC

## 2021-12-27 MED ORDER — ATENOLOL 50 MG PO TABS: 50.0000 mg | ORAL_TABLET | Freq: Every day | ORAL | 1 refills | Status: DC

## 2021-12-27 MED ORDER — OMEPRAZOLE 20 MG PO CPDR: 20.0000 mg | DELAYED_RELEASE_CAPSULE | Freq: Every day | ORAL | 1 refills | Status: DC

## 2021-12-27 MED ORDER — ENALAPRIL-HYDROCHLOROTHIAZIDE 10-25 MG PO TABS: 1.0000 | ORAL_TABLET | Freq: Every day | ORAL | 1 refills | Status: DC

## 2021-12-27 NOTE — Progress Notes (Signed)
Subjective:    Patient ID: Janice Zamora, female    DOB: 1935-11-16, 86 y.o.   MRN: [id number]   Chief Complaint: medical management of chronic issues     HPI:  Janice Zamora is a 86 y.o. who identifies as a female who was assigned female at birth.   Social history: Lives with: husband Work history: retired   Scientist, forensic in today for follow up of the following chronic medical issues:  1. Essential hypertension No c/o chest pain, sob or headache. Doe snot check blood pressure at home. BP Readings from Last 3 Encounters:  06/24/21 136/66  04/28/21 137/61  12/14/20 138/62     2. Mixed hyperlipidemia Does try  to watch diet but does no dedicated exercise. Lab Results  Component Value Date   CHOL 142 06/24/2021   HDL 40 06/24/2021   LDLCALC 77 06/24/2021   TRIG 143 06/24/2021   CHOLHDL 3.6 06/24/2021     3. Hypothyroidism due to acquired atrophy of thyroid No problems that she is aware of. Lab Results  Component Value Date   TSH 1.070 12/14/2020     4. Gastroesophageal reflux disease without esophagitis Is on omeprazole daily and is doing well.  5. Metabolic syndrome Does not check blood sugar at home Lab Results  Component Value Date   HGBA1C 6.0 (H) 06/24/2021     6. GAD (generalized anxiety disorder) Is on ativan as needed. Takes at least 1x a day and some days 2x a day.    12/27/2021   10:06 AM 06/24/2021    8:52 AM 12/14/2020    8:18 AM 06/11/2020    8:05 AM  GAD 7 : Generalized Anxiety Score  Nervous, Anxious, on Edge 0 0 0 0  Control/stop worrying 0 0 0 0  Worry too much - different things 0 0 0 0  Trouble relaxing 0 0 0 0  Restless 0 0 0 0  Easily annoyed or irritable 0 0 0 0  Afraid - awful might happen 0 0 0 0  Total GAD 7 Score 0 0 0 0  Anxiety Difficulty Not difficult at all Not difficult at all Not difficult at all Not difficult at all    .phq  7. Osteopenia of lumbar spine Last dexascan was done on 12/12/19. Does no weight  bearing exercises  8. Overweight (BMI 25.0-29.9) No recent weight changes Wt Readings from Last 3 Encounters:  12/27/21 158 lb (71.7 kg)  06/24/21 158 lb (71.7 kg)  04/28/21 159 lb 6 oz (72.3 kg)   BMI Readings from Last 3 Encounters:  12/27/21 28.90 kg/m  06/24/21 28.90 kg/m  04/28/21 30.11 kg/m     New complaints: None today  Allergies  Allergen Reactions   Anesthesia S-I-40 [Propofol] Nausea And Vomiting   Outpatient Encounter Medications as of 12/27/2021  Medication Sig   atenolol (TENORMIN) 50 MG tablet Take 1 tablet (50 mg total) by mouth daily.   diclofenac Sodium (VOLTAREN) 1 % GEL Apply 4 g topically 4 (four) times daily.   enalapril-hydrochlorothiazide (VASERETIC) 10-25 MG tablet Take 1 tablet by mouth daily.   levocetirizine (XYZAL) 5 MG tablet Take 1 tablet (5 mg total) by mouth every evening.   levothyroxine (SYNTHROID) 75 MCG tablet Take 1 tablet (75 mcg total) by mouth daily before breakfast.   LORazepam (ATIVAN) 0.5 MG tablet Take 1 tablet (0.5 mg total) by mouth 2 (two) times daily as needed. for anxiety   Multiple Vitamin (MULTIVITAMIN WITH MINERALS) TABS tablet Take  1 tablet by mouth daily.   omeprazole (PRILOSEC) 20 MG capsule Take 1 capsule (20 mg total) by mouth daily.   simvastatin (ZOCOR) 40 MG tablet Take 1 tablet (40 mg total) by mouth at bedtime.   triamcinolone cream (KENALOG) 0.1 % Apply 1 application topically 2 (two) times daily.   No facility-administered encounter medications on file as of 12/27/2021.    Past Surgical History:  Procedure Laterality Date   BREAST BIOPSY Left    BREAST SURGERY     fibriotic cyst removed   CHOLECYSTECTOMY     right hand injection      Family History  Problem Relation Age of Onset   Hypertension Mother    Hypertension Father    Hyperlipidemia Sister    Hyperlipidemia Brother    Hyperlipidemia Sister    Hyperlipidemia Sister    Healthy Daughter    Healthy Son    Healthy Daughter    Healthy Son     Breast cancer Neg Hx       Controlled substance contract: 12/27/21 drug screen today     Review of Systems  Constitutional:  Negative for diaphoresis.  Eyes:  Negative for pain.  Respiratory:  Negative for shortness of breath.   Cardiovascular:  Negative for chest pain, palpitations and leg swelling.  Gastrointestinal:  Negative for abdominal pain.  Endocrine: Negative for polydipsia.  Skin:  Negative for rash.  Neurological:  Negative for dizziness, weakness and headaches.  Hematological:  Does not bruise/bleed easily.  All other systems reviewed and are negative.      Objective:   Physical Exam Vitals and nursing note reviewed.  Constitutional:      General: She is not in acute distress.    Appearance: Normal appearance. She is well-developed.  HENT:     Head: Normocephalic.     Right Ear: Tympanic membrane normal.     Left Ear: Tympanic membrane normal.     Nose: Nose normal.     Mouth/Throat:     Mouth: Mucous membranes are moist.  Eyes:     Pupils: Pupils are equal, round, and reactive to light.  Neck:     Vascular: No carotid bruit or JVD.  Cardiovascular:     Rate and Rhythm: Normal rate and regular rhythm.     Heart sounds: Normal heart sounds.  Pulmonary:     Effort: Pulmonary effort is normal. No respiratory distress.     Breath sounds: Normal breath sounds. No wheezing or rales.  Chest:     Chest wall: No tenderness.  Abdominal:     General: Bowel sounds are normal. There is no distension or abdominal bruit.     Palpations: Abdomen is soft. There is no hepatomegaly, splenomegaly, mass or pulsatile mass.     Tenderness: There is no abdominal tenderness.  Musculoskeletal:        General: Normal range of motion.     Cervical back: Normal range of motion and neck supple.  Lymphadenopathy:     Cervical: No cervical adenopathy.  Skin:    General: Skin is warm and dry.  Neurological:     Mental Status: She is alert and oriented to person, place, and  time.     Deep Tendon Reflexes: Reflexes are normal and symmetric.  Psychiatric:        Behavior: Behavior normal.        Thought Content: Thought content normal.        Judgment: Judgment normal.     BP Marland Kitchen)  137/58   Pulse 64   Temp (!) 97 F (36.1 C) (Temporal)   Resp 20   Ht _0  (1.575 m)   Wt 158 lb (71.7 kg)   SpO2 99%   BMI 28.90 kg/m   HGBA1c 6.8%      Assessment & Plan:   Alaisa B Roberge comes in today with chief complaint of Medical Management of Chronic Issues   Diagnosis and orders addressed:  1. Essential hypertension Low sodium diet - CBC with Differential/Platelet - CMP14+EGFR - atenolol (TENORMIN) 50 MG tablet; Take 1 tablet (50 mg total) by mouth daily.  Dispense: 90 tablet; Refill: 1 - enalapril-hydrochlorothiazide (VASERETIC) 10-25 MG tablet; Take 1 tablet by mouth daily.  Dispense: 90 tablet; Refill: 1  2. Mixed hyperlipidemia Low fat diet - Lipid panel - simvastatin (ZOCOR) 40 MG tablet; Take 1 tablet (40 mg total) by mouth at bedtime.  Dispense: 90 tablet; Refill: 1  3. Hypothyroidism due to acquired atrophy of thyroid Labs pending - levothyroxine (SYNTHROID) 75 MCG tablet; Take 1 tablet (75 mcg total) by mouth daily before breakfast.  Dispense: 90 tablet; Refill: 1  4. Gastroesophageal reflux disease without esophagitis Avoid spicy foods Do not eat 2 hours prior to bedtime - omeprazole (PRILOSEC) 20 MG capsule; Take 1 capsule (20 mg total) by mouth daily.  Dispense: 90 capsule; Refill: 1  5. Metabolic syndrome Watch carbs in diet - Bayer DCA Hb A1c Waived - Microalbumin / creatinine urine ratio  6. GAD (generalized anxiety disorder) Stress management - ToxASSURE Select 13 (MW), Urine - LORazepam (ATIVAN) 0.5 MG tablet; Take 1 tablet (0.5 mg total) by mouth 2 (two) times daily as needed. for anxiety  Dispense: 60 tablet; Refill: 2  7. Osteopenia of lumbar spine Weight bearing exercise  8. Overweight (BMI 25.0-29.9) Discussed  diet and exercise for person with BMI >25 Will recheck weight in 3-6 months    Labs pending Health Maintenance reviewed Diet and exercise encouraged  Follow up plan: 6 months   Mary-Margaret Hassell Done, FNP

## 2021-12-27 NOTE — Patient Instructions (Signed)

## 2021-12-28 LAB — CMP14+EGFR
ALT: 14 IU/L (ref 0–32)
AST: 20 IU/L (ref 0–40)
Albumin/Globulin Ratio: 1.9 (ref 1.2–2.2)
Albumin: 4.8 g/dL — ABNORMAL HIGH (ref 3.7–4.7)
Alkaline Phosphatase: 74 IU/L (ref 44–121)
BUN/Creatinine Ratio: 16 (ref 12–28)
BUN: 18 mg/dL (ref 8–27)
Bilirubin Total: 0.4 mg/dL (ref 0.0–1.2)
CO2: 21 mmol/L (ref 20–29)
Calcium: 10.1 mg/dL (ref 8.7–10.3)
Chloride: 99 mmol/L (ref 96–106)
Creatinine, Ser: 1.12 mg/dL — ABNORMAL HIGH (ref 0.57–1.00)
Globulin, Total: 2.5 g/dL (ref 1.5–4.5)
Glucose: 143 mg/dL — ABNORMAL HIGH (ref 70–99)
Potassium: 4.3 mmol/L (ref 3.5–5.2)
Sodium: 140 mmol/L (ref 134–144)
Total Protein: 7.3 g/dL (ref 6.0–8.5)
eGFR: 48 mL/min/{1.73_m2} — ABNORMAL LOW (ref >59)

## 2021-12-28 LAB — MICROALBUMIN / CREATININE URINE RATIO
Creatinine, Urine: 194.8 mg/dL
Microalb/Creat Ratio: 50 mg/g creat — ABNORMAL HIGH (ref 0–29)
Microalbumin, Urine: 97.1 ug/mL

## 2021-12-28 LAB — LIPID PANEL
Chol/HDL Ratio: 3.8 ratio (ref 0.0–4.4)
Cholesterol, Total: 172 mg/dL (ref 100–199)
HDL: 45 mg/dL (ref >39)
LDL Chol Calc (NIH): 93 mg/dL (ref 0–99)
Triglycerides: 199 mg/dL — ABNORMAL HIGH (ref 0–149)
VLDL Cholesterol Cal: 34 mg/dL (ref 5–40)

## 2021-12-28 LAB — CBC WITH DIFFERENTIAL/PLATELET
Basophils Absolute: 0.1 10*3/uL (ref 0.0–0.2)
Basos: 1 %
EOS (ABSOLUTE): 0.2 10*3/uL (ref 0.0–0.4)
Eos: 2 %
Hematocrit: 38.8 % (ref 34.0–46.6)
Hemoglobin: 13.4 g/dL (ref 11.1–15.9)
Immature Grans (Abs): 0 10*3/uL (ref 0.0–0.1)
Immature Granulocytes: 1 %
Lymphocytes Absolute: 2.3 10*3/uL (ref 0.7–3.1)
Lymphs: 34 %
MCH: 32.2 pg (ref 26.6–33.0)
MCHC: 34.5 g/dL (ref 31.5–35.7)
MCV: 93 fL (ref 79–97)
Monocytes Absolute: 0.6 10*3/uL (ref 0.1–0.9)
Monocytes: 9 %
Neutrophils Absolute: 3.6 10*3/uL (ref 1.4–7.0)
Neutrophils: 53 %
Platelets: 211 10*3/uL (ref 150–450)
RBC: 4.16 x10E6/uL (ref 3.77–5.28)
RDW: 11.9 % (ref 11.7–15.4)
WBC: 6.7 10*3/uL (ref 3.4–10.8)

## 2021-12-29 LAB — TOXASSURE SELECT 13 (MW), URINE

## 2022-01-14 ENCOUNTER — Ambulatory Visit (INDEPENDENT_AMBULATORY_CARE_PROVIDER_SITE_OTHER): Payer: PPO

## 2022-01-14 VITALS — Ht 61.0 in | Wt 158.0 lb

## 2022-01-14 DIAGNOSIS — Z780 Asymptomatic menopausal state: Secondary | ICD-10-CM | POA: Diagnosis not present

## 2022-01-14 DIAGNOSIS — Z0000 Encounter for general adult medical examination without abnormal findings: Secondary | ICD-10-CM | POA: Diagnosis not present

## 2022-01-14 NOTE — Patient Instructions (Signed)
Janice Zamora , Thank you for taking time to come for your Medicare Wellness Visit. I appreciate your ongoing commitment to your health goals. Please review the following plan we discussed and let me know if I can assist you in the future.   These are the goals we discussed:  Goals      Chronic Disease Management Needs     CARE PLAN ENTRY (see longtitudinal plan of care for additional care plan information)  Current Barriers:  Chronic Disease Management support, education, and care coordination needs related to HTN, hypothyroidism, osteopenia, HLD, metabolic syndrome  Clinical Goals: Over the next 10 days, patient will be contacted by a Care Guide to reschedule their Initial CCM Visit Over the next 30 days, patient will have an Initial CCM Visit with a member of the embedded CCM team to discuss self-management of their chronic medical conditions  Interventions: Chart reviewed in preparation for initial visit telephone call Collaboration with other care team members as needed Unsuccessful outreach to patient  A HIPPA compliant phone message was left for the patient providing contact information and requesting a return call.  Request sent to care guides to reach out and reschedule patient's initial visit  Patient Self Care Activities: Undetermined   Initial goal documentation      Exercise 3x per week (30 min per time)     Walk for 30 minutes 3 time per week     Exercise 3x per week (30 min per time)     Patient Stated     01/13/2020 AWV Goal: Exercise for General Health  Patient will verbalize understanding of the benefits of increased physical activity: Exercising regularly is important. It will improve your overall fitness, flexibility, and endurance. Regular exercise also will improve your overall health. It can help you control your weight, reduce stress, and improve your bone density. Over the next year, patient will increase physical activity as tolerated with a goal of at  least 150 minutes of moderate physical activity per week.  You can tell that you are exercising at a moderate intensity if your heart starts beating faster and you start breathing faster but can still hold a conversation. Moderate-intensity exercise ideas include: Walking 1 mile (1.6 km) in about 15 minutes Biking Hiking Golfing Dancing Water aerobics Patient will verbalize understanding of everyday activities that increase physical activity by providing examples like the following: Yard work, such as: Sales promotion account executive Gardening Washing windows or floors Patient will be able to explain general safety guidelines for exercising:  Before you start a new exercise program, talk with your health care provider. Do not exercise so much that you hurt yourself, feel dizzy, or get very short of breath. Wear comfortable clothes and wear shoes with good support. Drink plenty of water while you exercise to prevent dehydration or heat stroke. Work out until your breathing and your heartbeat get faster.         This is a list of the screening recommended for you and due dates:  Health Maintenance  Topic Date Due   COVID-19 Vaccine (4 - 2023-24 season) 10/01/2021   DEXA scan (bone density measurement)  12/11/2021   Zoster (Shingles) Vaccine (1 of 2) 03/29/2022*   Mammogram  06/02/2022   Medicare Annual Wellness Visit  01/15/2023   DTaP/Tdap/Td vaccine (2 - Td or Tdap) 09/13/2025   Pneumonia Vaccine  Completed   Flu Shot  Completed  HPV Vaccine  Aged Out  *Topic was postponed. The date shown is not the original due date.    Advanced directives: Please bring a copy of your health care power of attorney and living will to the office to be added to your chart at your convenience.   Conditions/risks identified: Aim for 30 minutes of exercise or brisk walking, 6-8 glasses of water, and 5 servings of fruits and  vegetables each day.   Next appointment: Follow up in one year for your annual wellness visit    Preventive Care 65 Years and Older, Female Preventive care refers to lifestyle choices and visits with your health care provider that can promote health and wellness. What does preventive care include? A yearly physical exam. This is also called an annual well check. Dental exams once or twice a year. Routine eye exams. Ask your health care provider how often you should have your eyes checked. Personal lifestyle choices, including: Daily care of your teeth and gums. Regular physical activity. Eating a healthy diet. Avoiding tobacco and drug use. Limiting alcohol use. Practicing safe sex. Taking low-dose aspirin every day. Taking vitamin and mineral supplements as recommended by your health care provider. What happens during an annual well check? The services and screenings done by your health care provider during your annual well check will depend on your age, overall health, lifestyle risk factors, and family history of disease. Counseling  Your health care provider may ask you questions about your: Alcohol use. Tobacco use. Drug use. Emotional well-being. Home and relationship well-being. Sexual activity. Eating habits. History of falls. Memory and ability to understand (cognition). Work and work Statistician. Reproductive health. Screening  You may have the following tests or measurements: Height, weight, and BMI. Blood pressure. Lipid and cholesterol levels. These may be checked every 5 years, or more frequently if you are over 71 years old. Skin check. Lung cancer screening. You may have this screening every year starting at age 49 if you have a 30-pack-year history of smoking and currently smoke or have quit within the past 15 years. Fecal occult blood test (FOBT) of the stool. You may have this test every year starting at age 81. Flexible sigmoidoscopy or colonoscopy. You  may have a sigmoidoscopy every 5 years or a colonoscopy every 10 years starting at age 74. Hepatitis C blood test. Hepatitis B blood test. Sexually transmitted disease (STD) testing. Diabetes screening. This is done by checking your blood sugar (glucose) after you have not eaten for a while (fasting). You may have this done every 1-3 years. Bone density scan. This is done to screen for osteoporosis. You may have this done starting at age 65. Mammogram. This may be done every 1-2 years. Talk to your health care provider about how often you should have regular mammograms. Talk with your health care provider about your test results, treatment options, and if necessary, the need for more tests. Vaccines  Your health care provider may recommend certain vaccines, such as: Influenza vaccine. This is recommended every year. Tetanus, diphtheria, and acellular pertussis (Tdap, Td) vaccine. You may need a Td booster every 10 years. Zoster vaccine. You may need this after age 40. Pneumococcal 13-valent conjugate (PCV13) vaccine. One dose is recommended after age 8. Pneumococcal polysaccharide (PPSV23) vaccine. One dose is recommended after age 38. Talk to your health care provider about which screenings and vaccines you need and how often you need them. This information is not intended to replace advice given to you by  your health care provider. Make sure you discuss any questions you have with your health care provider. Document Released: 02/13/2015 Document Revised: 10/07/2015 Document Reviewed: 11/18/2014 Elsevier Interactive Patient Education  2017 [place] Prevention in the Home Falls can cause injuries. They can happen to people of all ages. There are many things you can do to make your home safe and to help prevent falls. What can I do on the outside of my home? Regularly fix the edges of walkways and driveways and fix any cracks. Remove anything that might make you trip as you walk  through a door, such as a raised step or threshold. Trim any bushes or trees on the path to your home. Use bright outdoor lighting. Clear any walking paths of anything that might make someone trip, such as rocks or tools. Regularly check to see if handrails are loose or broken. Make sure that both sides of any steps have handrails. Any raised decks and porches should have guardrails on the edges. Have any leaves, snow, or ice cleared regularly. Use sand or salt on walking paths during winter. Clean up any spills in your garage right away. This includes oil or grease spills. What can I do in the bathroom? Use night lights. Install grab bars by the toilet and in the tub and shower. Do not use towel bars as grab bars. Use non-skid mats or decals in the tub or shower. If you need to sit down in the shower, use a plastic, non-slip stool. Keep the floor dry. Clean up any water that spills on the floor as soon as it happens. Remove soap buildup in the tub or shower regularly. Attach bath mats securely with double-sided non-slip rug tape. Do not have throw rugs and other things on the floor that can make you trip. What can I do in the bedroom? Use night lights. Make sure that you have a light by your bed that is easy to reach. Do not use any sheets or blankets that are too big for your bed. They should not hang down onto the floor. Have a firm chair that has side arms. You can use this for support while you get dressed. Do not have throw rugs and other things on the floor that can make you trip. What can I do in the kitchen? Clean up any spills right away. Avoid walking on wet floors. Keep items that you use a lot in easy-to-reach places. If you need to reach something above you, use a strong step stool that has a grab bar. Keep electrical cords out of the way. Do not use floor polish or wax that makes floors slippery. If you must use wax, use non-skid floor wax. Do not have throw rugs and  other things on the floor that can make you trip. What can I do with my stairs? Do not leave any items on the stairs. Make sure that there are handrails on both sides of the stairs and use them. Fix handrails that are broken or loose. Make sure that handrails are as long as the stairways. Check any carpeting to make sure that it is firmly attached to the stairs. Fix any carpet that is loose or worn. Avoid having throw rugs at the top or bottom of the stairs. If you do have throw rugs, attach them to the floor with carpet tape. Make sure that you have a light switch at the top of the stairs and the bottom of the stairs. If you  do not have them, ask someone to add them for you. What else can I do to help prevent falls? Wear shoes that: Do not have high heels. Have rubber bottoms. Are comfortable and fit you well. Are closed at the toe. Do not wear sandals. If you use a stepladder: Make sure that it is fully opened. Do not climb a closed stepladder. Make sure that both sides of the stepladder are locked into place. Ask someone to hold it for you, if possible. Clearly mark and make sure that you can see: Any grab bars or handrails. First and last steps. Where the edge of each step is. Use tools that help you move around (mobility aids) if they are needed. These include: Canes. Walkers. Scooters. Crutches. Turn on the lights when you go into a dark area. Replace any light bulbs as soon as they burn out. Set up your furniture so you have a clear path. Avoid moving your furniture around. If any of your floors are uneven, fix them. If there are any pets around you, be aware of where they are. Review your medicines with your doctor. Some medicines can make you feel dizzy. This can increase your chance of falling. Ask your doctor what other things that you can do to help prevent falls. This information is not intended to replace advice given to you by your health care provider. Make sure you  discuss any questions you have with your health care provider. Document Released: 11/13/2008 Document Revised: 06/25/2015 Document Reviewed: 02/21/2014 Elsevier Interactive Patient Education  2017 [employer].

## 2022-01-14 NOTE — Progress Notes (Signed)
Subjective:   Janice Zamora is a 86 y.o. female who presents for Medicare Annual (Subsequent) preventive examination. I connected with  Kilea B Wilcher on 01/14/22 by a audio enabled telemedicine application and verified that I am speaking with the correct person using two identifiers.  Patient Location: Home  Provider Location: Home Office  I discussed the limitations of evaluation and management by telemedicine. The patient expressed understanding and agreed to proceed.  Review of Systems     Cardiac Risk Factors include: advanced age (>40mn, >>64women);hypertension     Objective:    Today's Vitals   01/14/22 0906  Weight: 158 lb (71.7 kg)  Height: '5\' 1"'$  (1.549 m)   Body mass index is 29.85 kg/m.     01/14/2022    9:09 AM 01/13/2021    9:28 AM 01/13/2020    9:53 AM 08/10/2017    9:09 AM 11/11/2016   10:14 AM 01/19/2016   11:13 AM 10/20/2014   10:25 AM  Advanced Directives  Does Patient Have a Medical Advance Directive? Yes Yes No No No No No  Type of [place]       Does patient want to make changes to medical advance directive?     No - Patient declined    Copy of [place] Chart? No - copy requested No - copy requested       Would patient like information on creating a medical advance directive?   No - Patient declined Yes (MAU/Ambulatory/Procedural Areas - Information given) No - Patient declined No - Patient declined Yes - Educational materials given    Current Medications (verified) Outpatient Encounter Medications as of 01/14/2022  Medication Sig   atenolol (TENORMIN) 50 MG tablet Take 1 tablet (50 mg total) by mouth daily.   diclofenac Sodium (VOLTAREN) 1 % GEL Apply 4 g topically 4 (four) times daily.   enalapril-hydrochlorothiazide (VASERETIC) 10-25 MG tablet Take 1 tablet by mouth daily.   levocetirizine (XYZAL) 5 MG tablet Take 1 tablet  (5 mg total) by mouth every evening.   levothyroxine (SYNTHROID) 75 MCG tablet Take 1 tablet (75 mcg total) by mouth daily before breakfast.   LORazepam (ATIVAN) 0.5 MG tablet Take 1 tablet (0.5 mg total) by mouth 2 (two) times daily as needed. for anxiety   Multiple Vitamin (MULTIVITAMIN WITH MINERALS) TABS tablet Take 1 tablet by mouth daily.   omeprazole (PRILOSEC) 20 MG capsule Take 1 capsule (20 mg total) by mouth daily.   simvastatin (ZOCOR) 40 MG tablet Take 1 tablet (40 mg total) by mouth at bedtime.   triamcinolone cream (KENALOG) 0.1 % Apply 1 application topically 2 (two) times daily.   No facility-administered encounter medications on file as of 01/14/2022.    Allergies (verified) Anesthesia s-i-40 [propofol]   History: Past Medical History:  Diagnosis Date   Cataract    Cholelithiasis    Diverticulosis    Fibrocystic breast disease    GAD (generalized anxiety disorder)    GERD (gastroesophageal reflux disease)    Hyperlipidemia    Hypertension    Hypothyroidism    Metabolic syndrome    Osteopenia    Past Surgical History:  Procedure Laterality Date   BREAST BIOPSY Left    BREAST SURGERY     fibriotic cyst removed   CHOLECYSTECTOMY     right hand injection     Family History  Problem Relation Age of Onset  Hypertension Mother    Hypertension Father    Hyperlipidemia Sister    Hyperlipidemia Brother    Hyperlipidemia Sister    Hyperlipidemia Sister    Healthy Daughter    Healthy Son    Healthy Daughter    Healthy Son    Breast cancer Neg Hx    Social History   Socioeconomic History   Marital status: Widowed    Spouse name: Not on file   Number of children: 4   Years of education: 8   Highest education level: Not on file  Occupational History   Occupation: homemaker  Tobacco Use   Smoking status: Never   Smokeless tobacco: Never  Vaping Use   Vaping Use: Never used  Substance and Sexual Activity   Alcohol use: No   Drug use: No   Sexual  activity: Not Currently    Birth control/protection: Post-menopausal  Other Topics Concern   Not on file  Social History Narrative   ** Merged History Encounter **       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 Maintains her own home and yard. Enjoys sewing (mask making).    Social Determinants of Health   Financial Resource Strain: Low Risk  (01/14/2022)   Overall Financial Resource Strain (CARDIA)    Difficulty of Paying Living Expenses: Not hard at all  Food Insecurity: No Food Insecurity (01/14/2022)   Hunger Vital Sign    Worried About Running Out of Food in the Last Year: Never true    Ran Out of Food in the Last Year: Never true  Transportation Needs: No Transportation Needs (01/14/2022)   PRAPARE - Hydrologist (Medical): No    Lack of Transportation (Non-Medical): No  Physical Activity: Insufficiently Active (01/14/2022)   Exercise Vital Sign    Days of Exercise per Week: 3 days    Minutes of Exercise per Session: 30 min  Stress: No Stress Concern Present (01/14/2022)   [place]    Feeling of Stress : Not at all  Social Connections: Moderately Isolated (01/14/2022)   Social Connection and Isolation Panel [NHANES]    Frequency of Communication with Friends and Family: More than three times a week    Frequency of Social Gatherings with Friends and Family: More than three times a week    Attends Religious Services: More than 4 times per year    Active Member of [employer] or Organizations: No    Attends Archivist Meetings: Never    Marital Status: Widowed    Tobacco Counseling Counseling given: Not Answered   Clinical Intake:  Pre-visit preparation completed: Yes  Pain : No/denies pain     Nutritional Risks: None Diabetes: No  How often do you need to have someone help you when you read instructions, pamphlets, or other written materials from your  doctor or pharmacy?: 1 - Never  Diabetic?no   Interpreter Needed?: No  Information entered by :: Jadene Pierini, LPN   Activities of Daily Living    01/14/2022    9:10 AM  In your present state of health, do you have any difficulty performing the following activities:  Hearing? 0  Vision? 0  Difficulty concentrating or making decisions? 0  Walking or climbing stairs? 0  Dressing or bathing? 0  Doing errands, shopping? 0  Preparing Food and eating ? N  Using the Toilet? N  In the past six months, have you accidently leaked  urine? N  Do you have problems with loss of bowel control? N  Managing your Medications? N  Managing your Finances? N  Housekeeping or managing your Housekeeping? N    Patient Care Team: Chevis Pretty, FNP as PCP - General (Nurse Practitioner) Irene Shipper, MD as Consulting Physician (Gastroenterology) Melina Schools, OD as Consulting Physician (Optometry)  Indicate any recent Medical Services you may have received from other than [facility] providers in the past year (date may be approximate).     Assessment:   This is a routine wellness examination for [place].  Hearing/Vision screen Vision Screening - Comments:: Wears rx glasses - up to date with routine eye exams with  Dr.Johnson   Dietary issues and exercise activities discussed: Current Exercise Habits: Home exercise routine, Type of exercise: walking, Time (Minutes): 30, Frequency (Times/Week): 3, Weekly Exercise (Minutes/Week): 90, Intensity: Mild, Exercise limited by: None identified   Goals Addressed             This Visit's Progress    Exercise 3x per week (30 min per time)   On track    Walk for 30 minutes 3 time per week       Depression Screen    01/14/2022    9:08 AM 12/27/2021   10:06 AM 06/24/2021    8:52 AM 01/13/2021    9:26 AM 12/14/2020    8:17 AM 09/24/2020   12:07 PM 06/11/2020    8:05 AM  PHQ 2/9 Scores  PHQ - 2 Score 0 0 0 0 0 0 0  PHQ- 9 Score 0 0 0 0 0       Fall Risk    01/14/2022    9:07 AM 12/27/2021   10:06 AM 06/24/2021    8:52 AM 01/13/2021    9:28 AM 12/14/2020    8:17 AM  Fall Risk   Falls in the past year? 0 0 1 0 0  Number falls in past yr: 0  0 0   Injury with Fall? 0  0 0   Risk for fall due to : No Fall Risks  History of fall(s) Medication side effect;Mental status change   Follow up Falls prevention discussed  Education provided Falls prevention discussed     FALL RISK PREVENTION PERTAINING TO THE HOME:  Any stairs in or around the home? Yes  If so, are there any without handrails? No  Home free of loose throw rugs in walkways, pet beds, electrical cords, etc? Yes  Adequate lighting in your home to reduce risk of falls? Yes   ASSISTIVE DEVICES UTILIZED TO PREVENT FALLS:  Life alert? No  Use of a cane, walker or w/c? No  Grab bars in the bathroom? Yes  Shower chair or bench in shower? Yes  Elevated toilet seat or a handicapped toilet? Yes       08/10/2017    9:11 AM 10/28/2015    2:01 PM 10/20/2014   10:33 AM  MMSE - Mini Mental State Exam  Orientation to time '5 5 5  '$ Orientation to Place '5 5 5  '$ Registration '3 3 3  '$ Attention/ Calculation 4 5   Recall '2 1 3  '$ Language- name 2 objects '2 2 2  '$ Language- repeat '1 1 1  '$ Language- follow 3 step command '3 3 3  '$ Language- read & follow direction '1 1 1  '$ Write a sentence '1 1 1  '$ Copy design '1 1 1  '$ Total score 28 28  01/14/2022    9:10 AM 01/13/2021    9:31 AM 01/13/2020    9:55 AM  6CIT Screen  What Year? 0 points 0 points 0 points  What month? 0 points 0 points 0 points  What time? 0 points 0 points 0 points  Count back from 20 0 points 0 points 0 points  Months in reverse 0 points 4 points 4 points  Repeat phrase 0 points 8 points 4 points  Total Score 0 points 12 points 8 points    Immunizations Immunization History  Administered Date(s) Administered   Fluad Quad(high Dose 65+) 12/11/2018, 12/12/2019, 12/27/2021   Influenza, High Dose  Seasonal PF 11/16/2016, 12/11/2017   Influenza,inj,Quad PF,6+ Mos 11/02/2012, 10/29/2013, 11/11/2014, 10/22/2015   Influenza-Unspecified 10/29/2020   Moderna Sars-Covid-2 Vaccination 03/08/2019, 04/09/2019, 01/29/2020   Pneumococcal Conjugate-13 09/08/2014   Pneumococcal Polysaccharide-23 06/01/2002   Tdap 09/14/2015    TDAP status: Up to date  Flu Vaccine status: Up to date  Pneumococcal vaccine status: Up to date  Covid-19 vaccine status: Completed vaccines  Qualifies for Shingles Vaccine? Yes   Zostavax completed No   Shingrix Completed?: No.    Education has been provided regarding the importance of this vaccine. Patient has been advised to call insurance company to determine out of pocket expense if they have not yet received this vaccine. Advised may also receive vaccine at local pharmacy or Health Dept. Verbalized acceptance and understanding.  Screening Tests Health Maintenance  Topic Date Due   COVID-19 Vaccine (4 - 2023-24 season) 10/01/2021   DEXA SCAN  12/11/2021   Zoster Vaccines- Shingrix (1 of 2) 03/29/2022 (Originally 02/28/1985)   MAMMOGRAM  06/02/2022   Medicare Annual Wellness (AWV)  01/15/2023   DTaP/Tdap/Td (2 - Td or Tdap) 09/13/2025   Pneumonia Vaccine 78+ Years old  Completed   INFLUENZA VACCINE  Completed   HPV VACCINES  Aged Out    Health Maintenance  Health Maintenance Due  Topic Date Due   COVID-19 Vaccine (4 - 2023-24 season) 10/01/2021   DEXA SCAN  12/11/2021    Colorectal cancer screening: No longer required.   Mammogram status: No longer required due to afe.  Bone Density status: Ordered 01/14/2022. Pt provided with contact info and advised to call to schedule appt.  Lung Cancer Screening: (Low Dose CT Chest recommended if Age 81-80 years, 30 pack-year currently smoking OR have quit w/in 15years.) does not qualify.   Lung Cancer Screening Referral: n/a  Additional Screening:  Hepatitis C Screening: does not qualify;   Vision  Screening: Recommended annual ophthalmology exams for early detection of glaucoma and other disorders of the eye. Is the patient up to date with their annual eye exam?  Yes  Who is the provider or what is the name of the office in which the patient attends annual eye exams? Dr.Johnson  If pt is not established with a provider, would they like to be referred to a provider to establish care? No .   Dental Screening: Recommended annual dental exams for proper oral hygiene  Community Resource Referral / Chronic Care Management: CRR required this visit?  No   CCM required this visit?  No      Plan:     I have personally reviewed and noted the following in the patient's chart:   Medical and social history Use of alcohol, tobacco or illicit drugs  Current medications and supplements including opioid prescriptions. Patient is not currently taking opioid prescriptions. Functional ability and status Nutritional status Physical  activity Advanced directives List of other physicians Hospitalizations, surgeries, and ER visits in previous 12 months Vitals Screenings to include cognitive, depression, and falls Referrals and appointments  In addition, I have reviewed and discussed with patient certain preventive protocols, quality metrics, and best practice recommendations. A written personalized care plan for preventive services as well as general preventive health recommendations were provided to patient.     Daphane Shepherd, LPN   28/20/8138   Nurse Notes: Due DEXA

## 2022-01-18 ENCOUNTER — Ambulatory Visit (INDEPENDENT_AMBULATORY_CARE_PROVIDER_SITE_OTHER): Payer: PPO

## 2022-01-18 DIAGNOSIS — Z780 Asymptomatic menopausal state: Secondary | ICD-10-CM | POA: Diagnosis not present

## 2022-04-28 ENCOUNTER — Other Ambulatory Visit: Payer: Self-pay | Admitting: Nurse Practitioner

## 2022-04-28 DIAGNOSIS — Z1231 Encounter for screening mammogram for malignant neoplasm of breast: Secondary | ICD-10-CM

## 2022-05-30 ENCOUNTER — Encounter: Payer: Self-pay | Admitting: Nurse Practitioner

## 2022-05-30 ENCOUNTER — Ambulatory Visit (INDEPENDENT_AMBULATORY_CARE_PROVIDER_SITE_OTHER): Payer: HMO | Admitting: Nurse Practitioner

## 2022-05-30 VITALS — BP 140/71 | HR 72 | Temp 97.6°F | Resp 20 | Ht 61.0 in | Wt 159.0 lb

## 2022-05-30 DIAGNOSIS — M8588 Other specified disorders of bone density and structure, other site: Secondary | ICD-10-CM | POA: Diagnosis not present

## 2022-05-30 DIAGNOSIS — E663 Overweight: Secondary | ICD-10-CM | POA: Diagnosis not present

## 2022-05-30 DIAGNOSIS — I10 Essential (primary) hypertension: Secondary | ICD-10-CM

## 2022-05-30 DIAGNOSIS — E034 Atrophy of thyroid (acquired): Secondary | ICD-10-CM

## 2022-05-30 DIAGNOSIS — E8881 Metabolic syndrome: Secondary | ICD-10-CM

## 2022-05-30 DIAGNOSIS — E782 Mixed hyperlipidemia: Secondary | ICD-10-CM

## 2022-05-30 DIAGNOSIS — K219 Gastro-esophageal reflux disease without esophagitis: Secondary | ICD-10-CM

## 2022-05-30 DIAGNOSIS — F411 Generalized anxiety disorder: Secondary | ICD-10-CM

## 2022-05-30 MED ORDER — LEVOTHYROXINE SODIUM 75 MCG PO TABS: 75.0000 ug | ORAL_TABLET | Freq: Every day | ORAL | 1 refills | Status: DC

## 2022-05-30 MED ORDER — LISINOPRIL-HYDROCHLOROTHIAZIDE 20-25 MG PO TABS: 1.0000 | ORAL_TABLET | Freq: Every day | ORAL | 3 refills | Status: DC

## 2022-05-30 MED ORDER — LORAZEPAM 0.5 MG PO TABS: 0.5000 mg | ORAL_TABLET | Freq: Two times a day (BID) | ORAL | 2 refills | Status: DC | PRN

## 2022-05-30 MED ORDER — SIMVASTATIN 40 MG PO TABS: 40.0000 mg | ORAL_TABLET | Freq: Every day | ORAL | 1 refills | Status: DC

## 2022-05-30 MED ORDER — OMEPRAZOLE 20 MG PO CPDR: 20.0000 mg | DELAYED_RELEASE_CAPSULE | Freq: Every day | ORAL | 1 refills | Status: DC

## 2022-05-30 NOTE — Patient Instructions (Signed)
Transient Ischemic Attack A transient ischemic attack (TIA) causes the same symptoms as a stroke, but the symptoms go away quickly. A TIA happens when blood flow to the brain is blocked. Having a TIA means you may be at risk for a stroke. A TIA is a medical emergency. What are the causes? A TIA is caused by a blocked artery in the head or neck. This means the brain does not get the blood supply it needs. A blockage can be caused by: Fatty buildup in an artery in the head or neck. A blood clot. A tear in an artery. Irritation and swelling (inflammation) of an artery. Sometimes the cause is not known. What increases the risk? Certain things may make you more likely to have a TIA. Some of these are things that you can change, such as: Using products that have nicotine or tobacco. Not being active. Drinking too much alcohol. Using recreational drugs. Health conditions that may increase your risk include: High blood pressure. High cholesterol. Diabetes. Heart disease. A heartbeat that is not regular (atrial fibrillation). Sickle cell disease. Problems with blood clotting. Other risk factors include: Being over the age of 60. Being female. Being very overweight. Sleep problems (sleep apnea). Having a family history of stroke. Having had blood clots, stroke, TIA, or heart attack in the past. What are the signs or symptoms? The symptoms of a TIA are like those of a stroke. They can include: Weakness or loss of feeling in your face, arm, or leg. This often happens on one side of your body. Trouble walking. Trouble moving your arms or legs. Trouble talking or understanding what people are saying. Problems with how you see. Feeling dizzy. Feeling confused. Loss of balance or coordination. Feeling like you may vomit (nausea) or vomiting. Having a very bad headache. If you can, note what time you started to have symptoms. Tell your doctor. How is this treated? The goal of treatment is to  lower the risk for a stroke. This may include: Changes to diet and lifestyle, such as getting regular exercise and stopping smoking. Taking medicines to: Thin the blood. Lower blood pressure. Lower cholesterol. Treating other health conditions, such as diabetes. If testing shows that an artery in your brain is narrow, your doctor may recommend a procedure to: Take the blockage out of your artery. Open or widen an artery in your neck (carotid angioplasty and stenting). Follow these instructions at home: Medicines Take over-the-counter and prescription medicines only as told by your doctor. If you were told to take aspirin or another medicine to thin your blood, use it exactly as told by your doctor. Taking too much of the medicine can cause bleeding. Taking too little of the medicine may not work to treat the problem. Eating and drinking  Eat 5 or more servings of fruits and vegetables each day. Follow instructions from your doctor about your diet. You may need to follow a certain diet to help lower your risk of a stroke. You may need to: Eat a diet that is low in fat and salt. Eat foods with a lot of fiber. Limit carbohydrates and sugar. If you drink alcohol: Limit how much you have to: 0-1 drink a day for women who are not pregnant. 0-2 drinks a day for men. Know how much alcohol is in a drink. In the [place] one drink equals one 12 oz bottle of beer (355 mL), one 5 oz glass of wine (148 mL), or one 1 oz glass of hard   liquor (44 mL). General instructions Keep a healthy weight. Try to get at least 30 minutes of exercise on most days. Get treatment if you have sleep problems. Do not smoke or use any products that contain nicotine or tobacco. If you need help quitting, ask your doctor. Do not use drugs. Keep all follow-up visits. Your doctor will want to know if you have any more symptoms and to check blood labs if any medicines were prescribed. Where to find more  information American Stroke Association: stroke.org Get help right away if: You have chest pain. You have a heartbeat that is not regular. You have any signs of a stroke. "BE FAST" is an easy way to remember the main warning signs: B - Balance. Dizziness, sudden trouble walking, or loss of balance. E - Eyes. Trouble seeing or a change in how you see. F - Face. Sudden weakness or loss of feeling of the face. The face or eyelid may droop on one side. A - Arms. Weakness or loss of feeling in an arm. This happens all of a sudden and most often on one side of the body. S - Speech. Sudden trouble speaking, slurred speech, or trouble understanding what people say. T - Time. Time to call emergency services. Write down what time symptoms started. You have other signs of a stroke, such as: A sudden, very bad headache with no known cause. Feeling like you may vomit. Vomiting. A seizure. These symptoms may be an emergency. Get help right away. Call 911. Do not wait to see if the symptoms will go away. Do not drive yourself to the hospital. This information is not intended to replace advice given to you by your health care provider. Make sure you discuss any questions you have with your health care provider. Document Revised: 07/02/2021 Document Reviewed: 07/02/2021 Elsevier Patient Education  2023 [employer]  

## 2022-05-30 NOTE — Progress Notes (Signed)
Subjective:    Patient ID: Janice Zamora, female    DOB: 1935/12/07, 87 y.o.   MRN: [id number]   Chief Complaint: medical management of chronic issues     HPI:  Janice Zamora is a 87 y.o. who identifies as a female who was assigned female at birth.   Social history: Lives with: by herself- her daughters check on her daily Work history: retired   Water engineer in today for follow up of the following chronic medical issues:  1. Essential hypertension No c/o chest pain, sob or headache. Doe snot dedicated exercise. BP Readings from Last 3 Encounters:  05/30/22 (!) 140/71  12/27/21 (!) 137/58  06/24/21 136/66     2. Mixed hyperlipidemia Does try to watch diet but does no dedicated exercise. Lab Results  Component Value Date   CHOL 172 12/27/2021   HDL 45 12/27/2021   LDLCALC 93 12/27/2021   TRIG 199 (H) 12/27/2021   CHOLHDL 3.8 12/27/2021     3. Hypothyroidism due to acquired atrophy of thyroid No issues that she is aware of Lab Results  Component Value Date   TSH 1.070 12/14/2020      4. Gastroesophageal reflux disease without esophagitis Is on omperazole  5. Metabolic syndrome Does not check her blood sugars at home. Lab Results  Component Value Date   HGBA1C 6.8 (H) 12/27/2021     6. GAD (generalized anxiety disorder) Is on ativan daily    05/30/2022   11:48 AM 12/27/2021   10:06 AM 06/24/2021    8:52 AM 12/14/2020    8:18 AM  GAD 7 : Generalized Anxiety Score  Nervous, Anxious, on Edge 0 0 0 0  Control/stop worrying 0 0 0 0  Worry too much - different things 0 0 0 0  Trouble relaxing 0 0 0 0  Restless 0 0 0 0  Easily annoyed or irritable 0 0 0 0  Afraid - awful might happen 0 0 0 0  Total GAD 7 Score 0 0 0 0  Anxiety Difficulty Not difficult at all Not difficult at all Not difficult at all Not difficult at all      7. Osteopenia of lumbar spine Last dexascan was done on 12/1921. Hr t score was 0.3  8. Overweight (BMI 25.0-29.9) No  recent weight changes Wt Readings from Last 3 Encounters:  05/30/22 159 lb (72.1 kg)  01/14/22 158 lb (71.7 kg)  12/27/21 158 lb (71.7 kg)   BMI Readings from Last 3 Encounters:  05/30/22 30.04 kg/m  01/14/22 29.85 kg/m  12/27/21 28.90 kg/m      New complaints: Speech has been more difficult and comprehension has worsened over the last year  Allergies  Allergen Reactions   Anesthesia S-I-40 [Propofol] Nausea And Vomiting   Outpatient Encounter Medications as of 05/30/2022  Medication Sig   atenolol (TENORMIN) 50 MG tablet Take 1 tablet (50 mg total) by mouth daily.   diclofenac Sodium (VOLTAREN) 1 % GEL Apply 4 g topically 4 (four) times daily.   enalapril-hydrochlorothiazide (VASERETIC) 10-25 MG tablet Take 1 tablet by mouth daily.   levocetirizine (XYZAL) 5 MG tablet Take 1 tablet (5 mg total) by mouth every evening.   levothyroxine (SYNTHROID) 75 MCG tablet Take 1 tablet (75 mcg total) by mouth daily before breakfast.   LORazepam (ATIVAN) 0.5 MG tablet Take 1 tablet (0.5 mg total) by mouth 2 (two) times daily as needed. for anxiety   Multiple Vitamin (MULTIVITAMIN WITH MINERALS) TABS tablet Take 1  tablet by mouth daily.   omeprazole (PRILOSEC) 20 MG capsule Take 1 capsule (20 mg total) by mouth daily.   simvastatin (ZOCOR) 40 MG tablet Take 1 tablet (40 mg total) by mouth at bedtime.   triamcinolone cream (KENALOG) 0.1 % Apply 1 application topically 2 (two) times daily.   No facility-administered encounter medications on file as of 05/30/2022.    Past Surgical History:  Procedure Laterality Date   BREAST BIOPSY Left    BREAST SURGERY     fibriotic cyst removed   CHOLECYSTECTOMY     right hand injection      Family History  Problem Relation Age of Onset   Hypertension Mother    Hypertension Father    Hyperlipidemia Sister    Hyperlipidemia Brother    Hyperlipidemia Sister    Hyperlipidemia Sister    Healthy Daughter    Healthy Son    Healthy Daughter     Healthy Son    Breast cancer Neg Hx       Controlled substance contract: n/a     Review of Systems  Constitutional:  Negative for diaphoresis.  Eyes:  Negative for pain.  Respiratory:  Negative for shortness of breath.   Cardiovascular:  Negative for chest pain, palpitations and leg swelling.  Gastrointestinal:  Negative for abdominal pain.  Endocrine: Negative for polydipsia.  Skin:  Negative for rash.  Neurological:  Negative for dizziness, weakness and headaches.  Hematological:  Does not bruise/bleed easily.  All other systems reviewed and are negative.      Objective:   Physical Exam Vitals and nursing note reviewed.  Constitutional:      General: She is not in acute distress.    Appearance: Normal appearance. She is well-developed.  HENT:     Head: Normocephalic.     Right Ear: Tympanic membrane normal.     Left Ear: Tympanic membrane normal.     Nose: Nose normal.     Mouth/Throat:     Mouth: Mucous membranes are moist.  Eyes:     Pupils: Pupils are equal, round, and reactive to light.  Neck:     Vascular: No carotid bruit or JVD.  Cardiovascular:     Rate and Rhythm: Normal rate and regular rhythm.     Heart sounds: Normal heart sounds.  Pulmonary:     Effort: Pulmonary effort is normal. No respiratory distress.     Breath sounds: Normal breath sounds. No wheezing or rales.  Chest:     Chest wall: No tenderness.  Abdominal:     General: Bowel sounds are normal. There is no distension or abdominal bruit.     Palpations: Abdomen is soft. There is no hepatomegaly, splenomegaly, mass or pulsatile mass.     Tenderness: There is no abdominal tenderness.  Musculoskeletal:        General: Normal range of motion.     Cervical back: Normal range of motion and neck supple.  Lymphadenopathy:     Cervical: No cervical adenopathy.  Skin:    General: Skin is warm and dry.  Neurological:     Mental Status: She is alert and oriented to person, place, and time.      Deep Tendon Reflexes: Reflexes are normal and symmetric.  Psychiatric:        Behavior: Behavior normal.        Thought Content: Thought content normal.        Judgment: Judgment normal.    BP (!) 140/71   Pulse  72   Temp 97.6 F (36.4 C) (Temporal)   Resp 20   Ht 5\' 1"  (1.549 m)   Wt 159 lb (72.1 kg)   SpO2 98%   BMI 30.04 kg/m         Assessment & Plan:   Keelan B Hallett comes in today with chief complaint of No chief complaint on file.   Diagnosis and orders addressed:  1. Essential hypertension Low sodium diet - lisinopril-hydrochlorothiazide (ZESTORETIC) 20-25 MG tablet; Take 1 tablet by mouth daily.  Dispense: 90 tablet; Refill: 3 - CBC with Differential/Platelet - CMP14+EGFR  2. Mixed hyperlipidemia Lowfat diet - simvastatin (ZOCOR) 40 MG tablet; Take 1 tablet (40 mg total) by mouth at bedtime.  Dispense: 90 tablet; Refill: 1 - Lipid panel  3. Hypothyroidism due to acquired atrophy of thyroid Labs pending - levothyroxine (SYNTHROID) 75 MCG tablet; Take 1 tablet (75 mcg total) by mouth daily before breakfast.  Dispense: 90 tablet; Refill: 1 - Thyroid Panel With TSH  4. Gastroesophageal reflux disease without esophagitis Avoid spicy foods Do not eat 2 hours prior to bedtime  - omeprazole (PRILOSEC) 20 MG capsule; Take 1 capsule (20 mg total) by mouth daily.  Dispense: 90 capsule; Refill: 1  5. Metabolic syndrome Watch carbs in diet  6. GAD (generalized anxiety disorder) Stress management - LORazepam (ATIVAN) 0.5 MG tablet; Take 1 tablet (0.5 mg total) by mouth 2 (two) times daily as needed. for anxiety  Dispense: 60 tablet; Refill: 2  7. Osteopenia of lumbar spine Weight bearing exercise  8. Overweight (BMI 25.0-29.9) Discussed diet and exercise for person with BMI >25 Will recheck weight in 3-6 months  Possible CVA or mini stroke- add baby aspirin to daily meds- report is worsening  Labs pending Health Maintenance reviewed Diet and  exercise encouraged  Follow up plan: 6 months   Mary-Margaret Daphine Deutscher, FNP

## 2022-05-31 LAB — CBC WITH DIFFERENTIAL/PLATELET
Basophils Absolute: 0.1 10*3/uL (ref 0.0–0.2)
Basos: 1 %
EOS (ABSOLUTE): 0.1 10*3/uL (ref 0.0–0.4)
Eos: 2 %
Hematocrit: 38.8 % (ref 34.0–46.6)
Hemoglobin: 13.1 g/dL (ref 11.1–15.9)
Immature Grans (Abs): 0.1 10*3/uL (ref 0.0–0.1)
Immature Granulocytes: 1 %
Lymphocytes Absolute: 2 10*3/uL (ref 0.7–3.1)
Lymphs: 30 %
MCH: 31.9 pg (ref 26.6–33.0)
MCHC: 33.8 g/dL (ref 31.5–35.7)
MCV: 94 fL (ref 79–97)
Monocytes Absolute: 0.5 10*3/uL (ref 0.1–0.9)
Monocytes: 8 %
Neutrophils Absolute: 3.8 10*3/uL (ref 1.4–7.0)
Neutrophils: 58 %
Platelets: 202 10*3/uL (ref 150–450)
RBC: 4.11 x10E6/uL (ref 3.77–5.28)
RDW: 12.5 % (ref 11.7–15.4)
WBC: 6.5 10*3/uL (ref 3.4–10.8)

## 2022-05-31 LAB — CMP14+EGFR
ALT: 16 IU/L (ref 0–32)
AST: 18 IU/L (ref 0–40)
Albumin/Globulin Ratio: 2 (ref 1.2–2.2)
Albumin: 4.6 g/dL (ref 3.7–4.7)
Alkaline Phosphatase: 77 IU/L (ref 44–121)
BUN/Creatinine Ratio: 22 (ref 12–28)
BUN: 25 mg/dL (ref 8–27)
Bilirubin Total: 0.4 mg/dL (ref 0.0–1.2)
CO2: 22 mmol/L (ref 20–29)
Calcium: 9.9 mg/dL (ref 8.7–10.3)
Chloride: 99 mmol/L (ref 96–106)
Creatinine, Ser: 1.16 mg/dL — ABNORMAL HIGH (ref 0.57–1.00)
Globulin, Total: 2.3 g/dL (ref 1.5–4.5)
Glucose: 126 mg/dL — ABNORMAL HIGH (ref 70–99)
Potassium: 4.2 mmol/L (ref 3.5–5.2)
Sodium: 139 mmol/L (ref 134–144)
Total Protein: 6.9 g/dL (ref 6.0–8.5)
eGFR: 46 mL/min/{1.73_m2} — ABNORMAL LOW (ref >59)

## 2022-05-31 LAB — THYROID PANEL WITH TSH
Free Thyroxine Index: 2.8 (ref 1.2–4.9)
T3 Uptake Ratio: 29 % (ref 24–39)
T4, Total: 9.8 ug/dL (ref 4.5–12.0)
TSH: 0.693 u[IU]/mL (ref 0.450–4.500)

## 2022-05-31 LAB — LIPID PANEL
Chol/HDL Ratio: 4.1 ratio (ref 0.0–4.4)
Cholesterol, Total: 179 mg/dL (ref 100–199)
HDL: 44 mg/dL (ref >39)
LDL Chol Calc (NIH): 103 mg/dL — ABNORMAL HIGH (ref 0–99)
Triglycerides: 182 mg/dL — ABNORMAL HIGH (ref 0–149)
VLDL Cholesterol Cal: 32 mg/dL (ref 5–40)

## 2022-06-07 ENCOUNTER — Other Ambulatory Visit: Payer: Self-pay | Admitting: Nurse Practitioner

## 2022-06-07 DIAGNOSIS — R4781 Slurred speech: Secondary | ICD-10-CM

## 2022-06-07 DIAGNOSIS — I10 Essential (primary) hypertension: Secondary | ICD-10-CM

## 2022-06-08 ENCOUNTER — Telehealth: Payer: Self-pay | Admitting: Nurse Practitioner

## 2022-06-08 NOTE — Telephone Encounter (Signed)
Daughter calling because patient needs referral sent to [facility], she was unable to get an appointment at [facility] until August and was told she can get in at the other office sooner. Please call back with any questions.

## 2022-06-13 ENCOUNTER — Ambulatory Visit
Admission: RE | Admit: 2022-06-13 | Discharge: 2022-06-13 | Disposition: A | Discharge status: Home / Self Care | Payer: HMO | Source: Ambulatory Visit | Attending: Nurse Practitioner | Admitting: Nurse Practitioner

## 2022-06-13 DIAGNOSIS — Z1231 Encounter for screening mammogram for malignant neoplasm of breast: Secondary | ICD-10-CM | POA: Diagnosis not present

## 2022-06-16 ENCOUNTER — Encounter: Payer: Self-pay | Admitting: Neurology

## 2022-09-12 NOTE — Progress Notes (Addendum)
NEUROLOGY CONSULTATION NOTE  Janice Zamora MRN: [id number] DOB: [birth year]  Referring provider: Bennie Pierini, FNP Primary care provider: Bennie Pierini, FNP  Reason for consult:  possible transient ischemic attack  Assessment/Plan:   Episodes of word-finding difficulty.  Suspect age-related cognitive deficits. Hypertension Hyperlipidemia   We will check MRI of brain without contrast. Patient and daughter inquired need to remain on ASA 81mg  daily.  I suggested to continue for now and we will wait and see what MRI shows, depending on degree of cerebrovascular disease. Further recommendations pending results.  10/30/2022 ADDENDUM:  MRI of brain from 9/24 showed mild chronic small vessel ischemic changes.  I don't think she had a mini-stroke and the MRI findings do not show any significant chronic vascular changes in the brain.   As she has no prior history of stroke or TIA, from a neurologic perspective, she should continue managing stroke risk factors but may discontinue the aspirin.    Subjective:  Janice Zamora is an 87 year old right-handed female with HTN, HLD, Metabolic syndrome, hypothyroidism, GERD and GAD who presents for possible transient ischemic attack.  History supplemented by her accompanying daughter and referring provider's note.  Current medications:  ASA 81mg  daily, lisinopril-hydrochlorothiazide, atenolol, simvastatin 40mg   She started experiencing episodic word-finding difficulty.  It can occur at anytime.  In the middle of a conversation, she will suddenly have trouble with word-recall and may lose her train of thought.  It may last 3 to 5 minutes.  No associated slurred speech, facial droop or unilateral numbness or weakness.  Occurs 2 to 3 times a week.  It hasn't progressed in frequency over the last year.  She may occasionally forget appointments or repeat things but does not tend to repeat questions and manages her own medications and  ADLs.  She drives without difficulty or disorientation on familiar routes.  Her daughter manages her bills but only because patient struggles to write due to her arthritis.    Labs from 05/30/2022 include LDL 103, TG 182, TSH 0.693 and T4 9.8.  Hgb A1c from 12/27/2021 was 6.8 (up from 6.0 from 06/24/2021.       PAST MEDICAL HISTORY: Past Medical History:  Diagnosis Date   Cataract    Cholelithiasis    Diverticulosis    Fibrocystic breast disease    GAD (generalized anxiety disorder)    GERD (gastroesophageal reflux disease)    Hyperlipidemia    Hypertension    Hypothyroidism    Metabolic syndrome    Osteopenia     PAST SURGICAL HISTORY: Past Surgical History:  Procedure Laterality Date   BREAST BIOPSY Left    BREAST SURGERY     fibriotic cyst removed   CHOLECYSTECTOMY     right hand injection      MEDICATIONS: Current Outpatient Medications on File Prior to Visit  Medication Sig Dispense Refill   atenolol (TENORMIN) 50 MG tablet Take 1 tablet by mouth once daily 90 tablet 1   diclofenac Sodium (VOLTAREN) 1 % GEL Apply 4 g topically 4 (four) times daily. 350 g 3   levocetirizine (XYZAL) 5 MG tablet Take 1 tablet (5 mg total) by mouth every evening. (Patient not taking: Reported on 05/30/2022) 30 tablet 2   levothyroxine (SYNTHROID) 75 MCG tablet Take 1 tablet (75 mcg total) by mouth daily before breakfast. 90 tablet 1   lisinopril-hydrochlorothiazide (ZESTORETIC) 20-25 MG tablet Take 1 tablet by mouth daily. 90 tablet 3   LORazepam (ATIVAN) 0.5 MG tablet Take  1 tablet (0.5 mg total) by mouth 2 (two) times daily as needed. for anxiety 60 tablet 2   Multiple Vitamin (MULTIVITAMIN WITH MINERALS) TABS tablet Take 1 tablet by mouth daily.     omeprazole (PRILOSEC) 20 MG capsule Take 1 capsule (20 mg total) by mouth daily. 90 capsule 1   simvastatin (ZOCOR) 40 MG tablet Take 1 tablet (40 mg total) by mouth at bedtime. 90 tablet 1   triamcinolone cream (KENALOG) 0.1 % Apply 1  application topically 2 (two) times daily. 453.6 g 0   No current facility-administered medications on file prior to visit.    ALLERGIES: Allergies  Allergen Reactions   Anesthesia S-I-40 [Propofol] Nausea And Vomiting    FAMILY HISTORY: Family History  Problem Relation Age of Onset   Hypertension Mother    Hypertension Father    Hyperlipidemia Sister    Hyperlipidemia Brother    Hyperlipidemia Sister    Hyperlipidemia Sister    Healthy Daughter    Healthy Son    Healthy Daughter    Healthy Son    Breast cancer Neg Hx     Objective:  Blood pressure 124/65, pulse 83, height 5\' 1"  (1.549 m), weight 160 lb 6.4 oz (72.8 kg), SpO2 99%. General: No acute distress.  Patient appears well-groomed.   Head:  Normocephalic/atraumatic Eyes:  fundi examined but not visualized Neck: supple, no paraspinal tenderness, full range of motion Back: No paraspinal tenderness Heart: regular rate and rhythm Lungs: Clear to auscultation bilaterally. Vascular: No carotid bruits. Neurological Exam: Mental status: alert and oriented to person, place, and time, speech fluent and not dysarthric, language intact. Cranial nerves: CN I: not tested CN II: pupils equal, round and reactive to light, visual fields intact CN III, IV, VI:  full range of motion, no nystagmus, no ptosis CN V: facial sensation intact. CN VII: upper and lower face symmetric CN VIII: hearing intact CN IX, X: gag intact, uvula midline CN XI: sternocleidomastoid and trapezius muscles intact CN XII: tongue midline Bulk & Tone: normal, no fasciculations. Motor:  muscle strength 5/5 throughout Sensation:  Pinprick and vibratory sensation slightly reduced in feet. Deep Tendon Reflexes:  1+ throughout,  toes downgoing.   Finger to nose testing:  Without dysmetria.    Gait:  Cautious.  Romberg negative.    Thank you for allowing me to take part in the care of this patient.  Shon Millet, DO  CC: Mary-Margaret Daphine Deutscher,  FNP

## 2022-09-13 ENCOUNTER — Ambulatory Visit (INDEPENDENT_AMBULATORY_CARE_PROVIDER_SITE_OTHER): Payer: HMO | Admitting: Neurology

## 2022-09-13 ENCOUNTER — Encounter: Payer: Self-pay | Admitting: Neurology

## 2022-09-13 ENCOUNTER — Ambulatory Visit: Payer: HMO | Admitting: Diagnostic Neuroimaging

## 2022-09-13 VITALS — BP 124/65 | HR 83 | Ht 61.0 in | Wt 160.4 lb

## 2022-09-13 DIAGNOSIS — E782 Mixed hyperlipidemia: Secondary | ICD-10-CM | POA: Diagnosis not present

## 2022-09-13 DIAGNOSIS — I10 Essential (primary) hypertension: Secondary | ICD-10-CM | POA: Diagnosis not present

## 2022-09-13 DIAGNOSIS — R4789 Other speech disturbances: Secondary | ICD-10-CM | POA: Diagnosis not present

## 2022-09-13 NOTE — Patient Instructions (Addendum)
I think you are experiencing age-related cognitive issues such as word-finding difficulty.  There isn't anything that specifically suggests mini strokes.  I would like to check MRI of brain, however.  Further recommendations pending results.   [facility] [phone] for MRI

## 2022-10-15 DIAGNOSIS — S01511A Laceration without foreign body of lip, initial encounter: Secondary | ICD-10-CM | POA: Diagnosis not present

## 2022-10-25 ENCOUNTER — Ambulatory Visit
Admission: RE | Admit: 2022-10-25 | Discharge: 2022-10-25 | Disposition: A | Discharge status: Home / Self Care | Payer: HMO | Source: Ambulatory Visit | Attending: Neurology | Admitting: Neurology

## 2022-10-25 DIAGNOSIS — R413 Other amnesia: Secondary | ICD-10-CM | POA: Diagnosis not present

## 2022-10-25 MED ORDER — GADOPICLENOL 0.5 MMOL/ML IV SOLN
8.0000 mL | Freq: Once | INTRAVENOUS | Status: AC | PRN
  Administered 2022-10-25: 8 mL via INTRAVENOUS

## 2022-11-28 ENCOUNTER — Ambulatory Visit (INDEPENDENT_AMBULATORY_CARE_PROVIDER_SITE_OTHER): Payer: HMO | Admitting: Nurse Practitioner

## 2022-11-28 ENCOUNTER — Encounter: Payer: Self-pay | Admitting: Nurse Practitioner

## 2022-11-28 VITALS — BP 143/61 | HR 65 | Temp 96.5°F | Resp 20 | Ht 61.0 in | Wt 159.0 lb

## 2022-11-28 DIAGNOSIS — E663 Overweight: Secondary | ICD-10-CM | POA: Diagnosis not present

## 2022-11-28 DIAGNOSIS — F411 Generalized anxiety disorder: Secondary | ICD-10-CM | POA: Diagnosis not present

## 2022-11-28 DIAGNOSIS — E782 Mixed hyperlipidemia: Secondary | ICD-10-CM

## 2022-11-28 DIAGNOSIS — M8588 Other specified disorders of bone density and structure, other site: Secondary | ICD-10-CM

## 2022-11-28 DIAGNOSIS — E034 Atrophy of thyroid (acquired): Secondary | ICD-10-CM

## 2022-11-28 DIAGNOSIS — K219 Gastro-esophageal reflux disease without esophagitis: Secondary | ICD-10-CM

## 2022-11-28 DIAGNOSIS — Z23 Encounter for immunization: Secondary | ICD-10-CM

## 2022-11-28 DIAGNOSIS — R2681 Unsteadiness on feet: Secondary | ICD-10-CM

## 2022-11-28 DIAGNOSIS — E8881 Metabolic syndrome: Secondary | ICD-10-CM | POA: Diagnosis not present

## 2022-11-28 DIAGNOSIS — I10 Essential (primary) hypertension: Secondary | ICD-10-CM

## 2022-11-28 MED ORDER — SIMVASTATIN 40 MG PO TABS: 40.0000 mg | ORAL_TABLET | Freq: Every day | ORAL | 1 refills | Status: DC

## 2022-11-28 MED ORDER — OMEPRAZOLE 20 MG PO CPDR: 20.0000 mg | DELAYED_RELEASE_CAPSULE | Freq: Every day | ORAL | 1 refills | Status: DC

## 2022-11-28 MED ORDER — LORAZEPAM 0.5 MG PO TABS: 0.5000 mg | ORAL_TABLET | Freq: Two times a day (BID) | ORAL | 2 refills | Status: DC | PRN

## 2022-11-28 MED ORDER — LISINOPRIL-HYDROCHLOROTHIAZIDE 20-25 MG PO TABS: 1.0000 | ORAL_TABLET | Freq: Every day | ORAL | 1 refills | Status: DC

## 2022-11-28 MED ORDER — ATENOLOL 50 MG PO TABS: 50.0000 mg | ORAL_TABLET | Freq: Every day | ORAL | 1 refills | Status: DC

## 2022-11-28 MED ORDER — LEVOTHYROXINE SODIUM 75 MCG PO TABS: 75.0000 ug | ORAL_TABLET | Freq: Every day | ORAL | 1 refills | Status: DC

## 2022-11-28 NOTE — Patient Instructions (Signed)
Fall Prevention in the Home, Adult Falls can cause injuries and can happen to people of all ages. There are many things you can do to make your home safer and to help prevent falls. What actions can I take to prevent falls? General information Use good lighting in all rooms. Make sure to: Replace any light bulbs that burn out. Turn on the lights in dark areas and use night-lights. Keep items that you use often in easy-to-reach places. Lower the shelves around your home if needed. Move furniture so that there are clear paths around it. Do not use throw rugs or other things on the floor that can make you trip. If any of your floors are uneven, fix them. Add color or contrast paint or tape to clearly mark and help you see: Grab bars or handrails. First and last steps of staircases. Where the edge of each step is. If you use a ladder or stepladder: Make sure that it is fully opened. Do not climb a closed ladder. Make sure the sides of the ladder are locked in place. Have someone hold the ladder while you use it. Know where your pets are as you move through your home. What can I do in the bathroom?     Keep the floor dry. Clean up any water on the floor right away. Remove soap buildup in the bathtub or shower. Buildup makes bathtubs and showers slippery. Use non-skid mats or decals on the floor of the bathtub or shower. Attach bath mats securely with double-sided, non-slip rug tape. If you need to sit down in the shower, use a non-slip stool. Install grab bars by the toilet and in the bathtub and shower. Do not use towel bars as grab bars. What can I do in the bedroom? Make sure that you have a light by your bed that is easy to reach. Do not use any sheets or blankets on your bed that hang to the floor. Have a firm chair or bench with side arms that you can use for support when you get dressed. What can I do in the kitchen? Clean up any spills right away. If you need to reach something  above you, use a step stool with a grab bar. Keep electrical cords out of the way. Do not use floor polish or wax that makes floors slippery. What can I do with my stairs? Do not leave anything on the stairs. Make sure that you have a light switch at the top and the bottom of the stairs. Make sure that there are handrails on both sides of the stairs. Fix handrails that are broken or loose. Install non-slip stair treads on all your stairs if they do not have carpet. Avoid having throw rugs at the top or bottom of the stairs. Choose a carpet that does not hide the edge of the steps on the stairs. Make sure that the carpet is firmly attached to the stairs. Fix carpet that is loose or worn. What can I do on the outside of my home? Use bright outdoor lighting. Fix the edges of walkways and driveways and fix any cracks. Clear paths of anything that can make you trip, such as tools or rocks. Add color or contrast paint or tape to clearly mark and help you see anything that might make you trip as you walk through a door, such as a raised step or threshold. Trim any bushes or trees on paths to your home. Check to see if handrails are loose   or broken and that both sides of all steps have handrails. Install guardrails along the edges of any raised decks and porches. Have leaves, snow, or ice cleared regularly. Use sand, salt, or ice melter on paths if you live where there is ice and snow during the winter. Clean up any spills in your garage right away. This includes grease or oil spills. What other actions can I take? Review your medicines with your doctor. Some medicines can cause dizziness or changes in blood pressure, which increase your risk of falling. Wear shoes that: Have a low heel. Do not wear high heels. Have rubber bottoms and are closed at the toe. Feel good on your feet and fit well. Use tools that help you move around if needed. These include: Canes. Walkers. Scooters. Crutches. Ask  your doctor what else you can do to help prevent falls. This may include seeing a physical therapist to learn to do exercises to move better and get stronger. Where to find more information Centers for Disease Control and Prevention, [employer]: cdc.gov National Institute on Aging: nia.nih.gov National Institute on Aging: nia.nih.gov Contact a doctor if: You are afraid of falling at home. You feel weak, drowsy, or dizzy at home. You fall at home. Get help right away if you: Lose consciousness or have trouble moving after a fall. Have a fall that causes a head injury. These symptoms may be an emergency. Get help right away. Call 911. Do not wait to see if the symptoms will go away. Do not drive yourself to the hospital. This information is not intended to replace advice given to you by your health care provider. Make sure you discuss any questions you have with your health care provider. Document Revised: 09/20/2021 Document Reviewed: 09/20/2021 Elsevier Patient Education  2024 [employer]  

## 2022-11-28 NOTE — Progress Notes (Signed)
Subjective:    Patient ID: Janice Zamora, female    DOB: 1935-07-17, 87 y.o.   MRN: [id number]   Chief Complaint: medical management of chronic issues     HPI:  Janice Zamora is a 87 y.o. who identifies as a female who was assigned female at birth.   Social history: Lives with: by herself- her daughters check on her daily Work history: retired   Water engineer in today for follow up of the following chronic medical issues:  1. Essential hypertension No c/o chest pain, sob or headache. Does not check blood pressure at home. BP Readings from Last 3 Encounters:  09/13/22 124/65  05/30/22 (!) 140/71  12/27/21 (!) 137/58     2. Mixed hyperlipidemia Does try to watch diet but does no exercise at all. Lab Results  Component Value Date   CHOL 179 05/30/2022   HDL 44 05/30/2022   LDLCALC 103 (H) 05/30/2022   TRIG 182 (H) 05/30/2022   CHOLHDL 4.1 05/30/2022     3. Metabolic syndrome Does not check blood sugars ar home. Lab Results  Component Value Date   HGBA1C 6.8 (H) 12/27/2021     4. Gastroesophageal reflux disease without esophagitis Is on omeprazole daily and is doing well  5. Hypothyroidism due to acquired atrophy of thyroid No issues that she is aware of. Lab Results  Component Value Date   TSH 0.693 05/30/2022     6. GAD (generalized anxiety disorder) Has ativan that she takes as needed. Doe snot take daily.    11/28/2022    9:49 AM 05/30/2022   11:48 AM 12/27/2021   10:06 AM 06/24/2021    8:52 AM  GAD 7 : Generalized Anxiety Score  Nervous, Anxious, on Edge 0 0 0 0  Control/stop worrying 0 0 0 0  Worry too much - different things 0 0 0 0  Trouble relaxing 0 0 0 0  Restless 0 0 0 0  Easily annoyed or irritable 0 0 0 0  Afraid - awful might happen 0 0 0 0  Total GAD 7 Score 0 0 0 0  Anxiety Difficulty Not difficult at all Not difficult at all Not difficult at all Not difficult at all      7. Osteopenia of lumbar spine No weight bearing  exercises. Last dexascan was done on 01/18/22. Her t score was 0.3 which was normal  8. Overweight (BMI 25.0-29.9) No recent weight changes Wt Readings from Last 3 Encounters:  11/28/22 159 lb (72.1 kg)  09/13/22 160 lb 6.4 oz (72.8 kg)  05/30/22 159 lb (72.1 kg)   BMI Readings from Last 3 Encounters:  11/28/22 30.04 kg/m  09/13/22 30.31 kg/m  05/30/22 30.04 kg/m      New complaints: None today  Allergies  Allergen Reactions   Anesthesia S-I-40 [Propofol] Nausea And Vomiting   Outpatient Encounter Medications as of 11/28/2022  Medication Sig   atenolol (TENORMIN) 50 MG tablet Take 1 tablet by mouth once daily   diclofenac Sodium (VOLTAREN) 1 % GEL Apply 4 g topically 4 (four) times daily.   levocetirizine (XYZAL) 5 MG tablet Take 1 tablet (5 mg total) by mouth every evening.   levothyroxine (SYNTHROID) 75 MCG tablet Take 1 tablet (75 mcg total) by mouth daily before breakfast.   lisinopril-hydrochlorothiazide (ZESTORETIC) 20-25 MG tablet Take 1 tablet by mouth daily.   LORazepam (ATIVAN) 0.5 MG tablet Take 1 tablet (0.5 mg total) by mouth 2 (two) times daily as needed. for anxiety  Multiple Vitamin (MULTIVITAMIN WITH MINERALS) TABS tablet Take 1 tablet by mouth daily.   omeprazole (PRILOSEC) 20 MG capsule Take 1 capsule (20 mg total) by mouth daily.   simvastatin (ZOCOR) 40 MG tablet Take 1 tablet (40 mg total) by mouth at bedtime.   triamcinolone cream (KENALOG) 0.1 % Apply 1 application topically 2 (two) times daily.   No facility-administered encounter medications on file as of 11/28/2022.    Past Surgical History:  Procedure Laterality Date   BREAST BIOPSY Left    BREAST SURGERY     fibriotic cyst removed   CHOLECYSTECTOMY     CHOLECYSTECTOMY     right hand injection      Family History  Problem Relation Age of Onset   Hypertension Mother    Hypertension Father    Hyperlipidemia Sister    Hyperlipidemia Sister    Hyperlipidemia Sister    Hyperlipidemia  Brother    Seizures Daughter    Healthy Daughter    Healthy Daughter    Seizures Son    Healthy Son    Healthy Son    Breast cancer Neg Hx       Controlled substance contract: n/a     Review of Systems  Constitutional:  Negative for diaphoresis.  Eyes:  Negative for pain.  Respiratory:  Negative for shortness of breath.   Cardiovascular:  Negative for chest pain, palpitations and leg swelling.  Gastrointestinal:  Negative for abdominal pain.  Endocrine: Negative for polydipsia.  Skin:  Negative for rash.  Neurological:  Negative for dizziness, weakness and headaches.  Hematological:  Does not bruise/bleed easily.  All other systems reviewed and are negative.      Objective:   Physical Exam Vitals and nursing note reviewed.  Constitutional:      General: She is not in acute distress.    Appearance: Normal appearance. She is well-developed.  HENT:     Head: Normocephalic.     Right Ear: Tympanic membrane normal.     Left Ear: Tympanic membrane normal.     Nose: Nose normal.     Mouth/Throat:     Mouth: Mucous membranes are moist.  Eyes:     Pupils: Pupils are equal, round, and reactive to light.  Neck:     Vascular: No carotid bruit or JVD.  Cardiovascular:     Rate and Rhythm: Normal rate and regular rhythm.     Heart sounds: Normal heart sounds.  Pulmonary:     Effort: Pulmonary effort is normal. No respiratory distress.     Breath sounds: Normal breath sounds. No wheezing or rales.  Chest:     Chest wall: No tenderness.  Abdominal:     General: Bowel sounds are normal. There is no distension or abdominal bruit.     Palpations: Abdomen is soft. There is no hepatomegaly, splenomegaly, mass or pulsatile mass.     Tenderness: There is no abdominal tenderness.  Musculoskeletal:        General: Normal range of motion.     Cervical back: Normal range of motion and neck supple.  Lymphadenopathy:     Cervical: No cervical adenopathy.  Skin:    General: Skin  is warm and dry.  Neurological:     Mental Status: She is alert and oriented to person, place, and time.     Deep Tendon Reflexes: Reflexes are normal and symmetric.  Psychiatric:        Behavior: Behavior normal.  Thought Content: Thought content normal.        Judgment: Judgment normal.     BP (!) 143/61   Pulse 65   Temp (!) 96.5 F (35.8 C) (Temporal)   Resp 20   Ht 5\' 1"  (1.549 m)   Wt 159 lb (72.1 kg)   SpO2 99%   BMI 30.04 kg/m        Assessment & Plan:   Janice Zamora comes in today with chief complaint of Medical Management of Chronic Issues   Diagnosis and orders addressed:  1. Essential hypertension Low sodium diet - atenolol (TENORMIN) 50 MG tablet; Take 1 tablet (50 mg total) by mouth daily.  Dispense: 90 tablet; Refill: 1 - lisinopril-hydrochlorothiazide (ZESTORETIC) 20-25 MG tablet; Take 1 tablet by mouth daily.  Dispense: 90 tablet; Refill: 1 - CBC with Differential/Platelet - CMP14+EGFR  2. Mixed hyperlipidemia Low fat diet - simvastatin (ZOCOR) 40 MG tablet; Take 1 tablet (40 mg total) by mouth at bedtime.  Dispense: 90 tablet; Refill: 1 - Lipid panel  3. Metabolic syndrome Watch carbs in diet - Thyroid Panel With TSH  4. Gastroesophageal reflux disease without esophagitis Avoid spicy foods Do not eat 2 hours prior to bedtime - omeprazole (PRILOSEC) 20 MG capsule; Take 1 capsule (20 mg total) by mouth daily.  Dispense: 90 capsule; Refill: 1  5. Hypothyroidism due to acquired atrophy of thyroid Labs pending - levothyroxine (SYNTHROID) 75 MCG tablet; Take 1 tablet (75 mcg total) by mouth daily before breakfast.  Dispense: 90 tablet; Refill: 1  6. GAD (generalized anxiety disorder) Stress management - LORazepam (ATIVAN) 0.5 MG tablet; Take 1 tablet (0.5 mg total) by mouth 2 (two) times daily as needed. for anxiety  Dispense: 60 tablet; Refill: 2  7. Osteopenia of lumbar spine Weight bearing exercise  8. Overweight (BMI  25.0-29.9) Discussed diet and exercise for person with BMI >25 Will recheck weight in 3-6 months    Labs pending Health Maintenance reviewed Diet and exercise encouraged  Follow up plan: 6 months   Mary-Margaret Daphine Deutscher, FNP

## 2022-11-29 ENCOUNTER — Other Ambulatory Visit: Payer: Self-pay

## 2022-11-29 LAB — CMP14+EGFR
ALT: 16 [IU]/L (ref 0–32)
AST: 20 [IU]/L (ref 0–40)
Albumin: 4.5 g/dL (ref 3.7–4.7)
Alkaline Phosphatase: 65 [IU]/L (ref 44–121)
BUN/Creatinine Ratio: 27 (ref 12–28)
BUN: 28 mg/dL — ABNORMAL HIGH (ref 8–27)
Bilirubin Total: 0.3 mg/dL (ref 0.0–1.2)
CO2: 20 mmol/L (ref 20–29)
Calcium: 10.1 mg/dL (ref 8.7–10.3)
Chloride: 102 mmol/L (ref 96–106)
Creatinine, Ser: 1.03 mg/dL — ABNORMAL HIGH (ref 0.57–1.00)
Globulin, Total: 2.5 g/dL (ref 1.5–4.5)
Glucose: 134 mg/dL — ABNORMAL HIGH (ref 70–99)
Potassium: 4.6 mmol/L (ref 3.5–5.2)
Sodium: 138 mmol/L (ref 134–144)
Total Protein: 7 g/dL (ref 6.0–8.5)
eGFR: 53 mL/min/{1.73_m2} — ABNORMAL LOW (ref >59)

## 2022-11-29 LAB — CBC WITH DIFFERENTIAL/PLATELET
Basophils Absolute: 0.1 10*3/uL (ref 0.0–0.2)
Basos: 1 %
EOS (ABSOLUTE): 0.2 10*3/uL (ref 0.0–0.4)
Eos: 3 %
Hematocrit: 37.2 % (ref 34.0–46.6)
Hemoglobin: 12.5 g/dL (ref 11.1–15.9)
Immature Grans (Abs): 0 10*3/uL (ref 0.0–0.1)
Immature Granulocytes: 1 %
Lymphocytes Absolute: 1.7 10*3/uL (ref 0.7–3.1)
Lymphs: 27 %
MCH: 32.3 pg (ref 26.6–33.0)
MCHC: 33.6 g/dL (ref 31.5–35.7)
MCV: 96 fL (ref 79–97)
Monocytes Absolute: 0.6 10*3/uL (ref 0.1–0.9)
Monocytes: 9 %
Neutrophils Absolute: 3.8 10*3/uL (ref 1.4–7.0)
Neutrophils: 59 %
Platelets: 173 10*3/uL (ref 150–450)
RBC: 3.87 x10E6/uL (ref 3.77–5.28)
RDW: 12.7 % (ref 11.7–15.4)
WBC: 6.4 10*3/uL (ref 3.4–10.8)

## 2022-11-29 LAB — THYROID PANEL WITH TSH
Free Thyroxine Index: 2.7 (ref 1.2–4.9)
T3 Uptake Ratio: 28 % (ref 24–39)
T4, Total: 9.8 ug/dL (ref 4.5–12.0)
TSH: 0.947 u[IU]/mL (ref 0.450–4.500)

## 2022-11-29 LAB — LIPID PANEL
Chol/HDL Ratio: 3.8 ratio (ref 0.0–4.4)
Cholesterol, Total: 155 mg/dL (ref 100–199)
HDL: 41 mg/dL (ref >39)
LDL Chol Calc (NIH): 86 mg/dL (ref 0–99)
Triglycerides: 159 mg/dL — ABNORMAL HIGH (ref 0–149)
VLDL Cholesterol Cal: 28 mg/dL (ref 5–40)

## 2022-11-29 MED ORDER — BLOOD GLUCOSE TEST VI STRP: 1.0000 | ORAL_STRIP | Freq: Three times a day (TID) | 0 refills | Status: AC

## 2022-11-29 MED ORDER — BLOOD GLUCOSE MONITORING SUPPL DEVI: 1.0000 | Freq: Three times a day (TID) | 0 refills | Status: AC

## 2022-11-29 MED ORDER — LANCETS MISC. MISC: 1.0000 | Freq: Three times a day (TID) | 0 refills | Status: AC

## 2022-11-29 MED ORDER — LANCET DEVICE MISC: 1.0000 | Freq: Three times a day (TID) | 0 refills | Status: AC

## 2022-12-01 DIAGNOSIS — R2681 Unsteadiness on feet: Secondary | ICD-10-CM | POA: Insufficient documentation

## 2022-12-01 NOTE — Addendum Note (Signed)
Addended by: Bennie Pierini on: 12/01/2022 03:19 PM   Modules accepted: Orders

## 2022-12-08 ENCOUNTER — Other Ambulatory Visit: Payer: Self-pay

## 2022-12-08 ENCOUNTER — Other Ambulatory Visit: Payer: HMO

## 2022-12-08 DIAGNOSIS — R739 Hyperglycemia, unspecified: Secondary | ICD-10-CM

## 2022-12-08 LAB — BAYER DCA HB A1C WAIVED: HB A1C (BAYER DCA - WAIVED): 6.3 % — ABNORMAL HIGH (ref 4.8–5.6)

## 2023-01-04 ENCOUNTER — Other Ambulatory Visit: Payer: Self-pay | Admitting: Nurse Practitioner

## 2023-01-04 DIAGNOSIS — Z1231 Encounter for screening mammogram for malignant neoplasm of breast: Secondary | ICD-10-CM

## 2023-01-17 ENCOUNTER — Ambulatory Visit: Payer: HMO

## 2023-01-17 VITALS — Ht 61.0 in | Wt 159.0 lb

## 2023-01-17 DIAGNOSIS — Z0000 Encounter for general adult medical examination without abnormal findings: Secondary | ICD-10-CM

## 2023-01-17 NOTE — Patient Instructions (Signed)
Ms. Crespi , Thank you for taking time to come for your Medicare Wellness Visit. I appreciate your ongoing commitment to your health goals. Please review the following plan we discussed and let me know if I can assist you in the future.   Referrals/Orders/Follow-Ups/Clinician Recommendations: Aim for 30 minutes of exercise or brisk walking, 6-8 glasses of water, and 5 servings of fruits and vegetables each day.  This is a list of the screening recommended for you and due dates:  Health Maintenance  Topic Date Due   COVID-19 Vaccine (4 - 2024-25 season) 10/02/2022   Zoster (Shingles) Vaccine (1 of 2) 02/28/2023*   Mammogram  06/13/2023   Medicare Annual Wellness Visit  01/17/2024   DEXA scan (bone density measurement)  01/19/2024   DTaP/Tdap/Td vaccine (2 - Td or Tdap) 09/13/2025   Pneumonia Vaccine  Completed   Flu Shot  Completed   HPV Vaccine  Aged Out  *Topic was postponed. The date shown is not the original due date.    Advanced directives: (ACP Link)Information on Advanced Care Planning can be found at [facility] Advance Health Care Directives Advance Health Care Directives ([email or web address]   Next Medicare Annual Wellness Visit scheduled for next year: Yes

## 2023-01-17 NOTE — Progress Notes (Signed)
Subjective:   Janice Zamora is a 87 y.o. female who presents for Medicare Annual (Subsequent) preventive examination.  Visit Complete: Virtual I connected with  Janice Zamora on 01/17/23 by a audio enabled telemedicine application and verified that I am speaking with the correct person using two identifiers.  Patient Location: Home  Provider Location: Home Office  I discussed the limitations of evaluation and management by telemedicine. The patient expressed understanding and agreed to proceed.  Vital Signs: Because this visit was a virtual/telehealth visit, some criteria may be missing or patient reported. Any vitals not documented were not able to be obtained and vitals that have been documented are patient reported.   Cardiac Risk Factors include: advanced age (>60men, >44 women);hypertension;sedentary lifestyle     Objective:    Today's Vitals   01/17/23 1453  Weight: 159 lb (72.1 kg)  Height: 5\' 1"  (1.549 m)   Body mass index is 30.04 kg/m.     01/17/2023    2:58 PM 09/13/2022    8:03 AM 01/14/2022    9:09 AM 01/13/2021    9:28 AM 01/13/2020    9:53 AM 08/10/2017    9:09 AM 11/11/2016   10:14 AM  Advanced Directives  Does Patient Have a Medical Advance Directive? No Yes Yes Yes No No No  Type of Special educational needs teacher of [place];Living will Healthcare Power of [place];Living will Healthcare Power of [place];Living will     Does patient want to make changes to medical advance directive?       No - Patient declined  Copy of Healthcare Power of Attorney in Chart?   No - copy requested No - copy requested     Would patient like information on creating a medical advance directive? Yes (MAU/Ambulatory/Procedural Areas - Information given)    No - Patient declined Yes (MAU/Ambulatory/Procedural Areas - Information given) No - Patient declined    Current Medications (verified) Outpatient Encounter Medications as of 01/17/2023  Medication Sig    atenolol (TENORMIN) 50 MG tablet Take 1 tablet (50 mg total) by mouth daily.   Blood Glucose Monitoring Suppl DEVI 1 each by Does not apply route in the morning, at noon, and at bedtime. May substitute to any manufacturer covered by patient's insurance.   diclofenac Sodium (VOLTAREN) 1 % GEL Apply 4 g topically 4 (four) times daily.   levocetirizine (XYZAL) 5 MG tablet Take 1 tablet (5 mg total) by mouth every evening.   levothyroxine (SYNTHROID) 75 MCG tablet Take 1 tablet (75 mcg total) by mouth daily before breakfast.   lisinopril-hydrochlorothiazide (ZESTORETIC) 20-25 MG tablet Take 1 tablet by mouth daily.   LORazepam (ATIVAN) 0.5 MG tablet Take 1 tablet (0.5 mg total) by mouth 2 (two) times daily as needed. for anxiety   Multiple Vitamin (MULTIVITAMIN WITH MINERALS) TABS tablet Take 1 tablet by mouth daily.   omeprazole (PRILOSEC) 20 MG capsule Take 1 capsule (20 mg total) by mouth daily.   simvastatin (ZOCOR) 40 MG tablet Take 1 tablet (40 mg total) by mouth at bedtime.   triamcinolone cream (KENALOG) 0.1 % Apply 1 application topically 2 (two) times daily.   No facility-administered encounter medications on file as of 01/17/2023.    Allergies (verified) Anesthesia s-i-40 [propofol]   History: Past Medical History:  Diagnosis Date   Cataract    Cholelithiasis    Diverticulosis    Fibrocystic breast disease    GAD (generalized anxiety disorder)    GERD (gastroesophageal reflux disease)  Hyperlipidemia    Hypertension    Hypothyroidism    Metabolic syndrome    Osteopenia    Past Surgical History:  Procedure Laterality Date   BREAST BIOPSY Left    BREAST SURGERY     fibriotic cyst removed   CHOLECYSTECTOMY     CHOLECYSTECTOMY     right hand injection     Family History  Problem Relation Age of Onset   Hypertension Mother    Hypertension Father    Hyperlipidemia Sister    Hyperlipidemia Sister    Hyperlipidemia Sister    Hyperlipidemia Brother    Seizures  Daughter    Healthy Daughter    Healthy Daughter    Seizures Son    Healthy Son    Healthy Son    Breast cancer Neg Hx    Social History   Socioeconomic History   Marital status: Widowed    Spouse name: Not on file   Number of children: 4   Years of education: 8   Highest education level: Not on file  Occupational History   Occupation: homemaker  Tobacco Use   Smoking status: Never   Smokeless tobacco: Never  Vaping Use   Vaping status: Never Used  Substance and Sexual Activity   Alcohol use: No   Drug use: No   Sexual activity: Not Currently    Birth control/protection: Post-menopausal  Other Topics Concern   Not on file  Social History Narrative   ** Merged History Encounter **       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 Maintains her own home and yard. Enjoys sewing (mask making).    Social Drivers of Corporate investment banker Strain: Low Risk  (01/17/2023)   Overall Financial Resource Strain (CARDIA)    Difficulty of Paying Living Expenses: Not hard at all  Food Insecurity: No Food Insecurity (01/17/2023)   Hunger Vital Sign    Worried About Running Out of Food in the Last Year: Never true    Ran Out of Food in the Last Year: Never true  Transportation Needs: No Transportation Needs (01/17/2023)   PRAPARE - Administrator, Civil Service (Medical): No    Lack of Transportation (Non-Medical): No  Physical Activity: Inactive (01/17/2023)   Exercise Vital Sign    Days of Exercise per Week: 0 days    Minutes of Exercise per Session: 0 min  Stress: No Stress Concern Present (01/17/2023)   [employer] of Occupational Health - Occupational Stress Questionnaire    Feeling of Stress : Not at all  Social Connections: Moderately Isolated (01/17/2023)   Social Connection and Isolation Panel [NHANES]    Frequency of Communication with Friends and Family: More than three times a week    Frequency of Social Gatherings with Friends and  Family: Three times a week    Attends Religious Services: 1 to 4 times per year    Active Member of Clubs or Organizations: No    Attends Banker Meetings: Never    Marital Status: Widowed    Tobacco Counseling Counseling given: Not Answered   Clinical Intake:  Pre-visit preparation completed: Yes  Pain : No/denies pain     Diabetes: No  How often do you need to have someone help you when you read instructions, pamphlets, or other written materials from your doctor or pharmacy?: 1 - Never  Interpreter Needed?: No  Comments: Assisted with visit by daughter Clearence Ped Information entered by :: Kandis Fantasia  LPN   Activities of Daily Living    01/17/2023    2:57 PM  In your present state of health, do you have any difficulty performing the following activities:  Hearing? 0  Vision? 0  Difficulty concentrating or making decisions? 0  Walking or climbing stairs? 1  Dressing or bathing? 0  Doing errands, shopping? 1  Preparing Food and eating ? N  Using the Toilet? N  In the past six months, have you accidently leaked urine? N  Do you have problems with loss of bowel control? N  Managing your Medications? Y  Managing your Finances? Y  Housekeeping or managing your Housekeeping? N    Patient Care Team: Bennie Pierini, FNP as PCP - General (Nurse Practitioner) Hilarie Fredrickson, MD as Consulting Physician (Gastroenterology) Derryl Harbor, OD as Consulting Physician (Optometry)  Indicate any recent Medical Services you may have received from other than [facility] providers in the past year (date may be approximate).     Assessment:   This is a routine wellness examination for Narelle.  Hearing/Vision screen Hearing Screening - Comments:: Some hearing loss  Vision Screening - Comments:: Wears rx glasses - up to date with routine eye exams with [facility]     Goals Addressed             This Visit's Progress    COMPLETED: Patient Stated        01/13/2020 AWV Goal: Exercise for General Health  Patient will verbalize understanding of the benefits of increased physical activity: Exercising regularly is important. It will improve your overall fitness, flexibility, and endurance. Regular exercise also will improve your overall health. It can help you control your weight, reduce stress, and improve your bone density. Over the next year, patient will increase physical activity as tolerated with a goal of at least 150 minutes of moderate physical activity per week.  You can tell that you are exercising at a moderate intensity if your heart starts beating faster and you start breathing faster but can still hold a conversation. Moderate-intensity exercise ideas include: Walking 1 mile (1.6 km) in about 15 minutes Biking Hiking Golfing Dancing Water aerobics Patient will verbalize understanding of everyday activities that increase physical activity by providing examples like the following: Yard work, such as: Insurance underwriter Gardening Washing windows or floors Patient will be able to explain general safety guidelines for exercising:  Before you start a new exercise program, talk with your health care provider. Do not exercise so much that you hurt yourself, feel dizzy, or get very short of breath. Wear comfortable clothes and wear shoes with good support. Drink plenty of water while you exercise to prevent dehydration or heat stroke. Work out until your breathing and your heartbeat get faster.      Remain active and independent        Depression Screen    01/17/2023    2:56 PM 11/28/2022    9:49 AM 05/30/2022   11:48 AM 01/14/2022    9:08 AM 12/27/2021   10:06 AM 06/24/2021    8:52 AM 01/13/2021    9:26 AM  PHQ 2/9 Scores  PHQ - 2 Score 0 0 0 0 0 0 0  PHQ- 9 Score 6 8 4  0 0 0 0    Fall Risk    01/17/2023    2:57 PM 11/28/2022     9:48 AM 09/13/2022  8:03 AM 05/30/2022   11:48 AM 01/14/2022    9:07 AM  Fall Risk   Falls in the past year? 1 1 0 1 0  Number falls in past yr: 1 1 0 0 0  Injury with Fall? 1 1 0 0 0  Risk for fall due to : History of fall(s);Impaired balance/gait;Impaired mobility History of fall(s)  History of fall(s) No Fall Risks  Follow up Education provided;Falls prevention discussed;Falls evaluation completed Falls prevention discussed Falls evaluation completed Education provided Falls prevention discussed    MEDICARE RISK AT HOME: Medicare Risk at Home Any stairs in or around the home?: No If so, are there any without handrails?: No Home free of loose throw rugs in walkways, pet beds, electrical cords, etc?: Yes Adequate lighting in your home to reduce risk of falls?: Yes Life alert?: No Use of a cane, walker or w/c?: Yes Grab bars in the bathroom?: Yes Shower chair or bench in shower?: Yes Elevated toilet seat or a handicapped toilet?: Yes  TIMED UP AND GO:  Was the test performed?  No    Cognitive Function:    08/10/2017    9:11 AM 10/28/2015    2:01 PM 10/20/2014   10:33 AM  MMSE - Mini Mental State Exam  Orientation to time 5 5 5   Orientation to Place 5 5 5   Registration 3 3 3   Attention/ Calculation 4 5   Recall 2 1 3   Language- name 2 objects 2 2 2   Language- repeat 1 1 1   Language- follow 3 step command 3 3 3   Language- read & follow direction 1 1 1   Write a sentence 1 1 1   Copy design 1 1 1   Total score 28 28         01/17/2023    2:58 PM 01/14/2022    9:10 AM 01/13/2021    9:31 AM 01/13/2020    9:55 AM  6CIT Screen  What Year? 0 points 0 points 0 points 0 points  What month? 0 points 0 points 0 points 0 points  What time? 0 points 0 points 0 points 0 points  Count back from 20 0 points 0 points 0 points 0 points  Months in reverse 2 points 0 points 4 points 4 points  Repeat phrase 2 points 0 points 8 points 4 points  Total Score 4 points 0 points 12 points  8 points    Immunizations Immunization History  Administered Date(s) Administered   Fluad Quad(high Dose 65+) 12/11/2018, 12/12/2019, 12/27/2021   Fluad Trivalent(High Dose 65+) 11/28/2022   Influenza, High Dose Seasonal PF 11/16/2016, 12/11/2017   Influenza,inj,Quad PF,6+ Mos 11/02/2012, 10/29/2013, 11/11/2014, 10/22/2015   Influenza-Unspecified 10/29/2020   Moderna Sars-Covid-2 Vaccination 03/08/2019, 04/09/2019, 01/29/2020   Pneumococcal Conjugate-13 09/08/2014   Pneumococcal Polysaccharide-23 06/01/2002   Tdap 09/14/2015    TDAP status: Up to date  Flu Vaccine status: Up to date  Pneumococcal vaccine status: Up to date  Covid-19 vaccine status: Information provided on how to obtain vaccines.   Qualifies for Shingles Vaccine? Yes   Zostavax completed No   Shingrix Completed?: No.    Education has been provided regarding the importance of this vaccine. Patient has been advised to call insurance company to determine out of pocket expense if they have not yet received this vaccine. Advised may also receive vaccine at local pharmacy or Health Dept. Verbalized acceptance and understanding.  Screening Tests Health Maintenance  Topic Date Due   COVID-19 Vaccine (4 - 2024-25  season) 10/02/2022   Zoster Vaccines- Shingrix (1 of 2) 02/28/2023 (Originally 02/28/1985)   MAMMOGRAM  06/13/2023   Medicare Annual Wellness (AWV)  01/17/2024   DEXA SCAN  01/19/2024   DTaP/Tdap/Td (2 - Td or Tdap) 09/13/2025   Pneumonia Vaccine 61+ Years old  Completed   INFLUENZA VACCINE  Completed   HPV VACCINES  Aged Out    Health Maintenance  Health Maintenance Due  Topic Date Due   COVID-19 Vaccine (4 - 2024-25 season) 10/02/2022    Colorectal cancer screening: No longer required.   Mammogram status: Completed 06/13/22. Repeat every year  Bone Density status: Completed 01/18/22. Results reflect: Bone density results: NORMAL. Repeat every 5 years.  Lung Cancer Screening: (Low Dose CT Chest  recommended if Age 49-80 years, 20 pack-year currently smoking OR have quit w/in 15years.) does not qualify.   Lung Cancer Screening Referral: n/a  Additional Screening:  Hepatitis C Screening: does not qualify  Vision Screening: Recommended annual ophthalmology exams for early detection of glaucoma and other disorders of the eye. Is the patient up to date with their annual eye exam?  Yes  Who is the provider or what is the name of the office in which the patient attends annual eye exams? [facility]  If pt is not established with a provider, would they like to be referred to a provider to establish care? No .   Dental Screening: Recommended annual dental exams for proper oral hygiene  Community Resource Referral / Chronic Care Management: CRR required this visit?  No   CCM required this visit?  No     Plan:     I have personally reviewed and noted the following in the patient's chart:   Medical and social history Use of alcohol, tobacco or illicit drugs  Current medications and supplements including opioid prescriptions. Patient is not currently taking opioid prescriptions. Functional ability and status Nutritional status Physical activity Advanced directives List of other physicians Hospitalizations, surgeries, and ER visits in previous 12 months Vitals Screenings to include cognitive, depression, and falls Referrals and appointments  In addition, I have reviewed and discussed with patient certain preventive protocols, quality metrics, and best practice recommendations. A written personalized care plan for preventive services as well as general preventive health recommendations were provided to patient.     Kandis Fantasia [place], [place]   [id number]   After Visit Summary: (MyChart) Due to this being a telephonic visit, the after visit summary with patients personalized plan was offered to patient via MyChart   Nurse Notes: No concerns at this time

## 2023-05-29 ENCOUNTER — Encounter: Payer: Self-pay | Admitting: Nurse Practitioner

## 2023-05-29 ENCOUNTER — Ambulatory Visit (INDEPENDENT_AMBULATORY_CARE_PROVIDER_SITE_OTHER): Payer: HMO | Admitting: Nurse Practitioner

## 2023-05-29 ENCOUNTER — Ambulatory Visit (INDEPENDENT_AMBULATORY_CARE_PROVIDER_SITE_OTHER)

## 2023-05-29 VITALS — BP 145/77 | HR 56 | Temp 97.1°F | Ht 61.0 in | Wt 153.0 lb

## 2023-05-29 DIAGNOSIS — Z0001 Encounter for general adult medical examination with abnormal findings: Secondary | ICD-10-CM | POA: Diagnosis not present

## 2023-05-29 DIAGNOSIS — Z6828 Body mass index (BMI) 28.0-28.9, adult: Secondary | ICD-10-CM | POA: Diagnosis not present

## 2023-05-29 DIAGNOSIS — E034 Atrophy of thyroid (acquired): Secondary | ICD-10-CM | POA: Diagnosis not present

## 2023-05-29 DIAGNOSIS — I10 Essential (primary) hypertension: Secondary | ICD-10-CM

## 2023-05-29 DIAGNOSIS — E782 Mixed hyperlipidemia: Secondary | ICD-10-CM

## 2023-05-29 DIAGNOSIS — M5185 Other intervertebral disc disorders, thoracolumbar region: Secondary | ICD-10-CM | POA: Diagnosis not present

## 2023-05-29 DIAGNOSIS — K219 Gastro-esophageal reflux disease without esophagitis: Secondary | ICD-10-CM | POA: Diagnosis not present

## 2023-05-29 DIAGNOSIS — F411 Generalized anxiety disorder: Secondary | ICD-10-CM

## 2023-05-29 DIAGNOSIS — E8881 Metabolic syndrome: Secondary | ICD-10-CM

## 2023-05-29 DIAGNOSIS — E663 Overweight: Secondary | ICD-10-CM | POA: Diagnosis not present

## 2023-05-29 DIAGNOSIS — Z0000 Encounter for general adult medical examination without abnormal findings: Secondary | ICD-10-CM

## 2023-05-29 DIAGNOSIS — M8588 Other specified disorders of bone density and structure, other site: Secondary | ICD-10-CM | POA: Diagnosis not present

## 2023-05-29 LAB — BAYER DCA HB A1C WAIVED: HB A1C (BAYER DCA - WAIVED): 6 % — ABNORMAL HIGH (ref 4.8–5.6)

## 2023-05-29 LAB — LIPID PANEL

## 2023-05-29 NOTE — Patient Instructions (Signed)
 Fall Prevention in the Home, Adult Falls can cause injuries and can happen to people of all ages. There are many things you can do to make your home safer and to help prevent falls. What actions can I take to prevent falls? General information Use good lighting in all rooms. Make sure to: Replace any light bulbs that burn out. Turn on the lights in dark areas and use night-lights. Keep items that you use often in easy-to-reach places. Lower the shelves around your home if needed. Move furniture so that there are clear paths around it. Do not use throw rugs or other things on the floor that can make you trip. If any of your floors are uneven, fix them. Add color or contrast paint or tape to clearly mark and help you see: Grab bars or handrails. First and last steps of staircases. Where the edge of each step is. If you use a ladder or stepladder: Make sure that it is fully opened. Do not climb a closed ladder. Make sure the sides of the ladder are locked in place. Have someone hold the ladder while you use it. Know where your pets are as you move through your home. What can I do in the bathroom?     Keep the floor dry. Clean up any water on the floor right away. Remove soap buildup in the bathtub or shower. Buildup makes bathtubs and showers slippery. Use non-skid mats or decals on the floor of the bathtub or shower. Attach bath mats securely with double-sided, non-slip rug tape. If you need to sit down in the shower, use a non-slip stool. Install grab bars by the toilet and in the bathtub and shower. Do not use towel bars as grab bars. What can I do in the bedroom? Make sure that you have a light by your bed that is easy to reach. Do not use any sheets or blankets on your bed that hang to the floor. Have a firm chair or bench with side arms that you can use for support when you get dressed. What can I do in the kitchen? Clean up any spills right away. If you need to reach something  above you, use a step stool with a grab bar. Keep electrical cords out of the way. Do not use floor polish or wax that makes floors slippery. What can I do with my stairs? Do not leave anything on the stairs. Make sure that you have a light switch at the top and the bottom of the stairs. Make sure that there are handrails on both sides of the stairs. Fix handrails that are broken or loose. Install non-slip stair treads on all your stairs if they do not have carpet. Avoid having throw rugs at the top or bottom of the stairs. Choose a carpet that does not hide the edge of the steps on the stairs. Make sure that the carpet is firmly attached to the stairs. Fix carpet that is loose or worn. What can I do on the outside of my home? Use bright outdoor lighting. Fix the edges of walkways and driveways and fix any cracks. Clear paths of anything that can make you trip, such as tools or rocks. Add color or contrast paint or tape to clearly mark and help you see anything that might make you trip as you walk through a door, such as a raised step or threshold. Trim any bushes or trees on paths to your home. Check to see if handrails are loose  or broken and that both sides of all steps have handrails. Install guardrails along the edges of any raised decks and porches. Have leaves, snow, or ice cleared regularly. Use sand, salt, or ice melter on paths if you live where there is ice and snow during the winter. Clean up any spills in your garage right away. This includes grease or oil spills. What other actions can I take? Review your medicines with your doctor. Some medicines can cause dizziness or changes in blood pressure, which increase your risk of falling. Wear shoes that: Have a low heel. Do not wear high heels. Have rubber bottoms and are closed at the toe. Feel good on your feet and fit well. Use tools that help you move around if needed. These include: Canes. Walkers. Scooters. Crutches. Ask  your doctor what else you can do to help prevent falls. This may include seeing a physical therapist to learn to do exercises to move better and get stronger. Where to find more information Centers for Disease Control and Prevention, STEADI: TonerPromos.no [employer] on Aging: BaseRingTones.pl National Institute on Aging: BaseRingTones.pl Contact a doctor if: You are afraid of falling at home. You feel weak, drowsy, or dizzy at home. You fall at home. Get help right away if you: Lose consciousness or have trouble moving after a fall. Have a fall that causes a head injury. These symptoms may be an emergency. Get help right away. Call 911. Do not wait to see if the symptoms will go away. Do not drive yourself to the hospital. This information is not intended to replace advice given to you by your health care provider. Make sure you discuss any questions you have with your health care provider. Document Revised: 09/20/2021 Document Reviewed: 09/20/2021 Elsevier Patient Education  2024 [employer].

## 2023-05-29 NOTE — Progress Notes (Signed)
 Subjective:    Patient ID: Janice Zamora, female    DOB: 1935-03-22, 88 y.o.   MRN: [id number]   Chief Complaint: annual physical     HPI:  Aarohi B Modi is a 88 y.o. who identifies as a female who was assigned female at birth.   Social history: Lives with: by herself- her daughters check on her daily Work history: retired   Water engineer in today for follow up of the following chronic medical issues:  1. Essential hypertension No c/o chest pain, sob or headache. Does not check blood pressure at home. BP Readings from Last 3 Encounters:  11/28/22 (!) 143/61  09/13/22 124/65  05/30/22 (!) 140/71     2. Mixed hyperlipidemia Does try to watch diet but does no exercise at all. Lab Results  Component Value Date   CHOL 155 11/28/2022   HDL 41 11/28/2022   LDLCALC 86 11/28/2022   TRIG 159 (H) 11/28/2022   CHOLHDL 3.8 11/28/2022     3. Metabolic syndrome Does not check blood sugars ar home. Lab Results  Component Value Date   HGBA1C 6.3 (H) 12/08/2022     4. Gastroesophageal reflux disease without esophagitis Is on omeprazole  daily and is doing well  5. Hypothyroidism due to acquired atrophy of thyroid  No issues that she is aware of. Lab Results  Component Value Date   TSH 0.947 11/28/2022     6. GAD (generalized anxiety disorder) Has ativan  that she takes as needed. Doe snot take daily.    11/28/2022    9:49 AM 05/30/2022   11:48 AM 12/27/2021   10:06 AM 06/24/2021    8:52 AM  GAD 7 : Generalized Anxiety Score  Nervous, Anxious, on Zamora 0 0 0 0  Control/stop worrying 0 0 0 0  Worry too much - different things 0 0 0 0  Trouble relaxing 0 0 0 0  Restless 0 0 0 0  Easily annoyed or irritable 0 0 0 0  Afraid - awful might happen 0 0 0 0  Total GAD 7 Score 0 0 0 0  Anxiety Difficulty Not difficult at all Not difficult at all Not difficult at all Not difficult at all      7. Osteopenia of lumbar spine No weight bearing exercises. Last dexascan was  done on 01/18/22. Her t score was 0.3 which was normal  8. Overweight (BMI 25.0-29.9) No recent weight changes Wt Readings from Last 3 Encounters:  01/17/23 159 lb (72.1 kg)  11/28/22 159 lb (72.1 kg)  09/13/22 160 lb 6.4 oz (72.8 kg)   BMI Readings from Last 3 Encounters:  01/17/23 30.04 kg/m  11/28/22 30.04 kg/m  09/13/22 30.31 kg/m      New complaints: None today  Allergies  Allergen Reactions   Anesthesia S-I-40 [Propofol] Nausea And Vomiting   Outpatient Encounter Medications as of 05/29/2023  Medication Sig   atenolol  (TENORMIN ) 50 MG tablet Take 1 tablet (50 mg total) by mouth daily.   Blood Glucose Monitoring Suppl DEVI 1 each by Does not apply route in the morning, at noon, and at bedtime. May substitute to any manufacturer covered by patient's insurance.   diclofenac  Sodium (VOLTAREN ) 1 % GEL Apply 4 g topically 4 (four) times daily.   levocetirizine (XYZAL ) 5 MG tablet Take 1 tablet (5 mg total) by mouth every evening.   levothyroxine  (SYNTHROID ) 75 MCG tablet Take 1 tablet (75 mcg total) by mouth daily before breakfast.   lisinopril -hydrochlorothiazide  (ZESTORETIC ) 20-25 MG tablet  Take 1 tablet by mouth daily.   LORazepam  (ATIVAN ) 0.5 MG tablet Take 1 tablet (0.5 mg total) by mouth 2 (two) times daily as needed. for anxiety   Multiple Vitamin (MULTIVITAMIN WITH MINERALS) TABS tablet Take 1 tablet by mouth daily.   omeprazole  (PRILOSEC) 20 MG capsule Take 1 capsule (20 mg total) by mouth daily.   simvastatin  (ZOCOR ) 40 MG tablet Take 1 tablet (40 mg total) by mouth at bedtime.   triamcinolone  cream (KENALOG ) 0.1 % Apply 1 application topically 2 (two) times daily.   No facility-administered encounter medications on file as of 05/29/2023.    Past Surgical History:  Procedure Laterality Date   BREAST BIOPSY Left    BREAST SURGERY     fibriotic cyst removed   CHOLECYSTECTOMY     CHOLECYSTECTOMY     right hand injection      Family History  Problem Relation  Age of Onset   Hypertension Mother    Hypertension Father    Hyperlipidemia Sister    Hyperlipidemia Sister    Hyperlipidemia Sister    Hyperlipidemia Brother    Seizures Daughter    Healthy Daughter    Healthy Daughter    Seizures Son    Healthy Son    Healthy Son    Breast cancer Neg Hx       Controlled substance contract: n/a     Review of Systems  Constitutional:  Negative for diaphoresis.  Eyes:  Negative for pain.  Respiratory:  Negative for shortness of breath.   Cardiovascular:  Negative for chest pain, palpitations and leg swelling.  Gastrointestinal:  Negative for abdominal pain.  Endocrine: Negative for polydipsia.  Skin:  Negative for rash.  Neurological:  Negative for dizziness, weakness and headaches.  Hematological:  Does not bruise/bleed easily.  All other systems reviewed and are negative.      Objective:   Physical Exam Vitals and nursing note reviewed.  Constitutional:      General: She is not in acute distress.    Appearance: Normal appearance. She is well-developed.  HENT:     Head: Normocephalic.     Right Ear: Tympanic membrane normal.     Left Ear: Tympanic membrane normal.     Nose: Nose normal.     Mouth/Throat:     Mouth: Mucous membranes are moist.  Eyes:     Pupils: Pupils are equal, round, and reactive to light.  Neck:     Vascular: No carotid bruit or JVD.  Cardiovascular:     Rate and Rhythm: Normal rate and regular rhythm.     Heart sounds: Normal heart sounds.  Pulmonary:     Effort: Pulmonary effort is normal. No respiratory distress.     Breath sounds: Normal breath sounds. No wheezing or rales.  Chest:     Chest wall: No tenderness.  Abdominal:     General: Bowel sounds are normal. There is no distension or abdominal bruit.     Palpations: Abdomen is soft. There is no hepatomegaly, splenomegaly, mass or pulsatile mass.     Tenderness: There is no abdominal tenderness.  Musculoskeletal:        General: Normal range  of motion.     Cervical back: Normal range of motion and neck supple.     Comments: Gait slow and steady- no cane or walker today  Lymphadenopathy:     Cervical: No cervical adenopathy.  Skin:    General: Skin is warm and dry.  Neurological:  Mental Status: She is alert and oriented to person, place, and time.     Deep Tendon Reflexes: Reflexes are normal and symmetric.  Psychiatric:        Behavior: Behavior normal.        Thought Content: Thought content normal.        Judgment: Judgment normal.     BP (!) 145/77   Pulse (!) 56   Temp (!) 97.1 F (36.2 C) (Temporal)   Ht 5\' 1"  (1.549 m)   Wt 153 lb (69.4 kg)   SpO2 97%   BMI 28.91 kg/m   HGBA1c 6.0%  EKG- NSR-Mary-Margaret Gaylyn Keas, FNP Chest xray clear-Preliminary reading by Irvine Mantis, FNP  [facility]      Assessment & Plan:   Elayne Greaser Gragert comes in today with chief complaint of annual physical  Diagnosis and orders addressed:  1. Essential hypertension Low sodium diet - atenolol  (TENORMIN ) 50 MG tablet; Take 1 tablet (50 mg total) by mouth daily.  Dispense: 90 tablet; Refill: 1 - lisinopril -hydrochlorothiazide  (ZESTORETIC ) 20-25 MG tablet; Take 1 tablet by mouth daily.  Dispense: 90 tablet; Refill: 1 - CBC with Differential/Platelet - CMP14+EGFR  2. Mixed hyperlipidemia Low fat diet - simvastatin  (ZOCOR ) 40 MG tablet; Take 1 tablet (40 mg total) by mouth at bedtime.  Dispense: 90 tablet; Refill: 1 - Lipid panel  3. Metabolic syndrome Watch carbs in diet - Thyroid  Panel With TSH  4. Gastroesophageal reflux disease without esophagitis Avoid spicy foods Do not eat 2 hours prior to bedtime - omeprazole  (PRILOSEC) 20 MG capsule; Take 1 capsule (20 mg total) by mouth daily.  Dispense: 90 capsule; Refill: 1  5. Hypothyroidism due to acquired atrophy of thyroid  Labs pending - levothyroxine  (SYNTHROID ) 75 MCG tablet; Take 1 tablet (75 mcg total) by mouth daily before breakfast.  Dispense: 90 tablet; Refill:  1  6. GAD (generalized anxiety disorder) Stress management - LORazepam  (ATIVAN ) 0.5 MG tablet; Take 1 tablet (0.5 mg total) by mouth 2 (two) times daily as needed. for anxiety  Dispense: 60 tablet; Refill: 2  7. Osteopenia of lumbar spine Weight bearing exercise  8. Overweight (BMI 25.0-29.9) Discussed diet and exercise for person with BMI >25 Will recheck weight in 3-6 months  9. Unsteady gait Walk with walker or cane to prevent falls    Labs pending Health Maintenance reviewed Diet and exercise encouraged  Follow up plan: 6 months   Mary-Margaret Gaylyn Keas, FNP

## 2023-05-30 LAB — CMP14+EGFR
ALT: 11 IU/L (ref 0–32)
AST: 15 IU/L (ref 0–40)
Albumin: 4.9 g/dL — ABNORMAL HIGH (ref 3.7–4.7)
Alkaline Phosphatase: 74 IU/L (ref 44–121)
BUN/Creatinine Ratio: 25 (ref 12–28)
BUN: 30 mg/dL — ABNORMAL HIGH (ref 8–27)
Bilirubin Total: 0.4 mg/dL (ref 0.0–1.2)
CO2: 19 mmol/L — ABNORMAL LOW (ref 20–29)
Calcium: 10 mg/dL (ref 8.7–10.3)
Chloride: 103 mmol/L (ref 96–106)
Creatinine, Ser: 1.18 mg/dL — ABNORMAL HIGH (ref 0.57–1.00)
Globulin, Total: 2.1 g/dL (ref 1.5–4.5)
Glucose: 121 mg/dL — ABNORMAL HIGH (ref 70–99)
Potassium: 4.4 mmol/L (ref 3.5–5.2)
Sodium: 140 mmol/L (ref 134–144)
Total Protein: 7 g/dL (ref 6.0–8.5)
eGFR: 45 mL/min/{1.73_m2} — ABNORMAL LOW (ref >59)

## 2023-05-30 LAB — CBC WITH DIFFERENTIAL/PLATELET
Basophils Absolute: 0.1 10*3/uL (ref 0.0–0.2)
Basos: 1 %
EOS (ABSOLUTE): 0.1 10*3/uL (ref 0.0–0.4)
Eos: 2 %
Hematocrit: 36.4 % (ref 34.0–46.6)
Hemoglobin: 12.5 g/dL (ref 11.1–15.9)
Immature Grans (Abs): 0 10*3/uL (ref 0.0–0.1)
Immature Granulocytes: 0 %
Lymphocytes Absolute: 2.2 10*3/uL (ref 0.7–3.1)
Lymphs: 32 %
MCH: 32.4 pg (ref 26.6–33.0)
MCHC: 34.3 g/dL (ref 31.5–35.7)
MCV: 94 fL (ref 79–97)
Monocytes Absolute: 0.6 10*3/uL (ref 0.1–0.9)
Monocytes: 8 %
Neutrophils Absolute: 3.8 10*3/uL (ref 1.4–7.0)
Neutrophils: 57 %
Platelets: 192 10*3/uL (ref 150–450)
RBC: 3.86 x10E6/uL (ref 3.77–5.28)
RDW: 12 % (ref 11.7–15.4)
WBC: 6.7 10*3/uL (ref 3.4–10.8)

## 2023-05-30 LAB — LIPID PANEL
Cholesterol, Total: 157 mg/dL (ref 100–199)
HDL: 45 mg/dL (ref >39)
LDL CALC COMMENT:: 3.5 ratio (ref 0.0–4.4)
LDL Chol Calc (NIH): 86 mg/dL (ref 0–99)
Triglycerides: 150 mg/dL — ABNORMAL HIGH (ref 0–149)
VLDL Cholesterol Cal: 26 mg/dL (ref 5–40)

## 2023-06-01 LAB — TOXASSURE SELECT 13 (MW), URINE

## 2023-06-04 ENCOUNTER — Other Ambulatory Visit: Payer: Self-pay | Admitting: Nurse Practitioner

## 2023-06-04 DIAGNOSIS — K219 Gastro-esophageal reflux disease without esophagitis: Secondary | ICD-10-CM

## 2023-06-04 DIAGNOSIS — E782 Mixed hyperlipidemia: Secondary | ICD-10-CM

## 2023-06-04 DIAGNOSIS — E034 Atrophy of thyroid (acquired): Secondary | ICD-10-CM

## 2023-06-04 DIAGNOSIS — F411 Generalized anxiety disorder: Secondary | ICD-10-CM

## 2023-06-04 DIAGNOSIS — I10 Essential (primary) hypertension: Secondary | ICD-10-CM

## 2023-06-12 ENCOUNTER — Encounter (HOSPITAL_COMMUNITY): Payer: Self-pay

## 2023-06-19 ENCOUNTER — Ambulatory Visit
Admission: RE | Admit: 2023-06-19 | Discharge: 2023-06-19 | Disposition: A | Discharge status: Home / Self Care | Payer: HMO | Source: Ambulatory Visit | Attending: Nurse Practitioner | Admitting: Nurse Practitioner

## 2023-06-19 DIAGNOSIS — Z1231 Encounter for screening mammogram for malignant neoplasm of breast: Secondary | ICD-10-CM

## 2023-07-12 ENCOUNTER — Other Ambulatory Visit: Payer: Self-pay | Admitting: Nurse Practitioner

## 2023-07-12 DIAGNOSIS — F411 Generalized anxiety disorder: Secondary | ICD-10-CM

## 2023-09-18 ENCOUNTER — Ambulatory Visit: Payer: Self-pay

## 2023-09-18 NOTE — Telephone Encounter (Signed)
 FYI Only or Action Required?: FYI only for provider.  Patient was last seen in primary care on 05/29/2023 by [name], FNP.  Called Nurse Triage reporting Foot Swelling.  Symptoms began yesterday.  Interventions attempted: Nothing.  Symptoms are: gradually worsening.  Triage Disposition: See PCP When Office is Open (Within 3 Days)  Patient/caregiver understands and will follow disposition?: Yes     Copied from CRM #[id number]. Topic: Clinical - Red Word Triage >> Sep 18, 2023  2:01 PM [name] wrote: Red Word that prompted transfer to Nurse Triage: Debby (daughter) calling in. Pt has redness, swelling in toe. She says it looks like something was dropped on the toe or a bite, but patient does not recall. Reason for Disposition  [1] MODERATE pain (e.g., interferes with normal activities, limping) AND [2] present > 3 days  Answer Assessment - Initial Assessment Questions 1. ONSET: When did the pain start?      yesterday 2. LOCATION: Where is the pain located?      Right foot & the toe next to Big toe 3. PAIN: How bad is the pain?    (Scale 1-10; or mild, moderate, severe)     moderate 4. WORK OR EXERCISE: Has there been any recent work or exercise that involved this part of the body?      na 5. CAUSE: What do you think is causing the foot pain?     unknown 6. OTHER SYMPTOMS: Do you have any other symptoms? (e.g., leg pain, rash, fever, numbness)     Redness, swelling, pain, bruising 7. PREGNANCY: Is there any chance you are pregnant? When was your last menstrual period?     na  Protocols used: Foot Pain-A-AH

## 2023-09-18 NOTE — Telephone Encounter (Signed)
 Appt made.

## 2023-09-19 ENCOUNTER — Encounter: Payer: Self-pay | Admitting: Nurse Practitioner

## 2023-09-19 ENCOUNTER — Ambulatory Visit (INDEPENDENT_AMBULATORY_CARE_PROVIDER_SITE_OTHER): Admitting: Nurse Practitioner

## 2023-09-19 ENCOUNTER — Ambulatory Visit (INDEPENDENT_AMBULATORY_CARE_PROVIDER_SITE_OTHER)

## 2023-09-19 VITALS — BP 152/60 | HR 73 | Temp 97.3°F | Ht 61.0 in | Wt 154.6 lb

## 2023-09-19 DIAGNOSIS — M7989 Other specified soft tissue disorders: Secondary | ICD-10-CM

## 2023-09-19 DIAGNOSIS — L03031 Cellulitis of right toe: Secondary | ICD-10-CM

## 2023-09-19 DIAGNOSIS — M79674 Pain in right toe(s): Secondary | ICD-10-CM

## 2023-09-19 MED ORDER — CEPHALEXIN 250 MG PO CAPS: 250.0000 mg | ORAL_CAPSULE | Freq: Two times a day (BID) | ORAL | 0 refills | Status: DC

## 2023-09-19 NOTE — Progress Notes (Signed)
 Acute Office Visit  Subjective:     Patient ID: Janice Zamora, female    DOB: Feb 21, 1935, 88 y.o.   MRN: [id number]  Chief Complaint  Patient presents with   Toe Pain    Right foot 2nd toe swollen, painful, red for 2 days     HPI Janice Zamora is an 88 year old female who presents on 09/19/2023 with her daughter for evaluation of right toe pain. Pain began 2 days ago while outside weeding. Per daughter, "she was outside weeding and believes that the battery fell on her foot." The patient cannot recall the exact incident. She has been taking over-the-counter Tylenol with some relief. Pain is rated 6/10 with walking and improves with sitting.   Active Ambulatory Problems    Diagnosis Date Noted   Essential hypertension 07/03/2012   Hypothyroidism 07/03/2012   Gastroesophageal reflux disease without esophagitis 07/03/2012   Hyperlipidemia 07/03/2012   GAD (generalized anxiety disorder) 07/03/2012   Overweight (BMI 25.0-29.9) 06/19/2013   Osteopenia 01/08/2014   Metabolic syndrome 10/20/2014   Vaginal atrophy 11/11/2016   Lichen sclerosus of vulva 09/24/2020   Unsteady gait 12/01/2022   Pain and swelling of toe of right foot 09/19/2023   Resolved Ambulatory Problems    Diagnosis Date Noted   Pre-diabetes 04/04/2016   Vaginal discharge 11/11/2016   Hematuria 11/11/2016   PMB (postmenopausal bleeding) 11/11/2016   Past Medical History:  Diagnosis Date   Cataract    Cholelithiasis    Diverticulosis    Fibrocystic breast disease    GERD (gastroesophageal reflux disease)    Hypertension     Review of Systems  Constitutional:  Negative for chills and fever.  Cardiovascular:  Negative for chest pain and leg swelling.  Musculoskeletal:  Negative for falls.       Toe pain 6/16/ with walking  Skin:  Negative for itching and rash.  Neurological:  Negative for dizziness and headaches.   Negative unless indicated in HPI    Objective:    BP (!) 152/60   Pulse 73    Temp (!) 97.3 F (36.3 C) (Temporal)   Ht 5' 1 (1.549 m)   Wt 154 lb 9.6 oz (70.1 kg)   SpO2 97%   BMI 29.21 kg/m  BP Readings from Last 3 Encounters:  09/19/23 (!) 152/60  05/29/23 (!) 145/77  11/28/22 (!) 143/61   Wt Readings from Last 3 Encounters:  09/19/23 154 lb 9.6 oz (70.1 kg)  05/29/23 153 lb (69.4 kg)  01/17/23 159 lb (72.1 kg)      Physical Exam Vitals and nursing note reviewed.  Constitutional:      General: She is not in acute distress. HENT:     Head: Normocephalic and atraumatic.  Eyes:     Extraocular Movements: Extraocular movements intact.     Conjunctiva/sclera: Conjunctivae normal.     Pupils: Pupils are equal, round, and reactive to light.  Cardiovascular:     Heart sounds: Normal heart sounds.  Pulmonary:     Effort: Pulmonary effort is normal.     Breath sounds: Normal breath sounds.  Musculoskeletal:     Comments: Right for 2nd toe swelling, warmth  Skin:    General: Skin is warm and dry.     Findings: No lesion or rash.  Neurological:     Mental Status: She is alert and oriented to person, place, and time.  Psychiatric:        Mood and Affect: Mood normal.  Behavior: Behavior normal.        Thought Content: Thought content normal.        Judgment: Judgment normal.    Pertinent labs & imaging results that were available during my care of the patient were reviewed by me and considered in my medical decision making.  No results found for any visits on 09/19/23.      Assessment & Plan:  Pain and swelling of toe of right foot -     DG Foot Complete Right -     Cephalexin ; Take 1 capsule (250 mg total) by mouth 2 (two) times daily.  Dispense: 14 capsule; Refill: 0  Cellulitis of second toe of right foot -     DG Foot Complete Right -     Cephalexin ; Take 1 capsule (250 mg total) by mouth 2 (two) times daily.  Dispense: 14 capsule; Refill: 0  Janice Zamora is a 88 year old Caucasian female seen today for right toe pain, no acute  distress Toe pain, right foot - rule out fracture vs contusion -Order right foot/toe X-ray to rule out fracture. - Start Cephalexin  (Keflex ) 500 mg PO BID x 7 days for possible soft tissue infection. Pain management: Continue OTC Tylenol as needed for pain. Supportive care: Rest, elevate foot, apply ice for swelling. Education: Discussed signs of infection and fracture complications with patient and daughter.   Return if symptoms worsen or fail to improve.  Crandall Harvel [place] Thompson, DNP [facility] [address] [place], [place] [address] [phone]  Note: This document was prepared by [employer] voice dictation technology and any errors that results from this process are unintentional.

## 2023-09-22 ENCOUNTER — Ambulatory Visit: Payer: Self-pay | Admitting: *Deleted

## 2023-09-22 NOTE — Telephone Encounter (Signed)
 FYI Only or Action Required?: Action required by provider: medication refill request and update on patient condition.  Patient was last seen in primary care on 09/19/2023 by Deitra Morton Sebastian Nena, NP.  Called Nurse Triage reporting Toe Pain.  Symptoms began today.  Interventions attempted: Prescription medications: cephalaxin .  Symptoms are: gradually worsening.  Triage Disposition: See HCP Within 4 Hours (Or PCP Triage)  Patient/caregiver understands and will follow disposition?: No, wishes to speak with PCP  Patient daughter requesting x ray results from 09/19/23. No response from PCP regarding results at this time to share with patient . Please advise.        Copied from CRM [id number]. Topic: Clinical - Red Word Triage >> Sep 22, 2023  3:37 PM Wess RAMAN wrote: Infection in toe. Redness, swelling, and pus. Also, something green like is under skin Reason for Disposition  [1] Finger or toe wound AND [2] entire finger or toe swollen  Answer Assessment - Initial Assessment Questions Patients daughter calling on DPR, requesting appt today . None available with any provider. Recommended UC /ED . Called [facility] and spoke with triage nurse at practice and also recommended ED. Patient / daughter not wanting to go to [facility] or ED. Recommended to continue antibiotics. Elevate right foot as needed for pain . If infected area starts to drain , contain drainage and get wound area clean and dry and wash hands thoroughly. Please advise      1. LOCATION: Where is the wound located?      Right 2nd toe red swollen inflamed yellow pus and something looks green under toe 2. WOUND APPEARANCE: What does the wound look like?      red 3. SIZE: If redness is present, ask: What is the size of the red area? (Inches, centimeters, or compare to size of a coin)      Hole 2nd  toe 4. SPREAD: What's changed in the last day?  Do you see any red streaks coming from the wound?     Worsening redness  and pus with green colored noted  5. ONSET: When did it start to look infected?      Noted today  6. MECHANISM: How did the wound start, what was the cause?     See last OV 09/19/23 7. PAIN: Do you have any pain?  If Yes, ask: How bad is the pain?  (e.g., Scale 1-10; mild, moderate, or severe)     Pain not severe 8. FEVER: Do you have a fever? If Yes, ask: What is your temperature, how was it measured, and when did it start?     na 9. OTHER SYMPTOMS: Do you have any other symptoms? (e.g., shaking chills, weakness, rash elsewhere on body)     Hole 2nd toe red pus and something green noted 10. PREGNANCY: Is there any chance you are pregnant? When was your last menstrual period?       na  Protocols used: Wound Infection Suspected-A-AH

## 2023-09-22 NOTE — Telephone Encounter (Signed)
 E2C2 nurse contacted the office and spoke with triage nurse. No appts available until Monday. Advised that patient should go to Urgent Care if she feels she can't wait and be seen on Monday. E2C2 nurse notified patient and made note in chart

## 2023-09-25 ENCOUNTER — Ambulatory Visit: Payer: Self-pay | Admitting: *Deleted

## 2023-09-25 ENCOUNTER — Ambulatory Visit (INDEPENDENT_AMBULATORY_CARE_PROVIDER_SITE_OTHER): Admitting: Family Medicine

## 2023-09-25 ENCOUNTER — Ambulatory Visit: Payer: Self-pay | Admitting: Nurse Practitioner

## 2023-09-25 VITALS — BP 136/65 | HR 72 | Temp 97.8°F | Ht 61.0 in | Wt 156.6 lb

## 2023-09-25 DIAGNOSIS — L02611 Cutaneous abscess of right foot: Secondary | ICD-10-CM

## 2023-09-25 DIAGNOSIS — R600 Localized edema: Secondary | ICD-10-CM

## 2023-09-25 MED ORDER — CIPROFLOXACIN HCL 250 MG PO TABS: 250.0000 mg | ORAL_TABLET | Freq: Two times a day (BID) | ORAL | 0 refills | Status: DC

## 2023-09-25 NOTE — Telephone Encounter (Signed)
 Pt was already here in the office waiting to be seen for this.

## 2023-09-25 NOTE — Progress Notes (Signed)
 Subjective:  Patient ID: Janice Zamora, female    DOB: [birth year]  Age: 88 y.o. MRN: [id number]  CC: Infected Toe (Right index toe)   HPI  Discussed the use of AI scribe software for clinical note transcription with the patient, who gave verbal consent to proceed.  History of Present Illness Janice Zamora is an 88 year old female who presents with swelling and white bumps on her toe. She is accompanied by her daughter, Janice Zamora.  She has experienced swelling and white bumps on her toe since last week, following a visit to the beach. The bumps are white and located on the side of the toe. The toe does not hurt unless pressure is applied downward, causing a stinging sensation. There was a lot of pain 3-4 days ago.  She recalls dropping a small weed eater battery on the toe, which may have contributed to the current condition. She has been prescribed 250 mg of Antimon but has not yet filled the prescription.  There is a family history of gout, as her brother has had it, but she herself has never been diagnosed with it. An x-ray was performed, but the results have not been communicated to her yet. No final reading available today.           09/25/2023    2:07 PM 05/29/2023   10:22 AM 01/17/2023    2:56 PM  Depression screen PHQ 2/9  Decreased Interest 0 0 0  Down, Depressed, Hopeless 0 0 0  PHQ - 2 Score 0 0 0  Altered sleeping 1  1  Tired, decreased energy 1  1  Change in appetite 0  0  Feeling bad or failure about yourself  0  0  Trouble concentrating 1  2  Moving slowly or fidgety/restless 1  2  Suicidal thoughts 0  0  PHQ-9 Score 4  6  Difficult doing work/chores Somewhat difficult  Not difficult at all    History Janice Zamora has a past medical history of Cataract, Cholelithiasis, Diverticulosis, Fibrocystic breast disease, GAD (generalized anxiety disorder), GERD (gastroesophageal reflux disease), Hyperlipidemia, Hypertension, Hypothyroidism, Metabolic syndrome,  and Osteopenia.   She has a past surgical history that includes Cholecystectomy; Breast surgery; right hand injection; Breast biopsy (Left); and Cholecystectomy.   Her family history includes Breast cancer in her daughter; Healthy in her daughter, daughter, son, and son; Hyperlipidemia in her brother, sister, sister, and sister; Hypertension in her father and mother; Seizures in her daughter and son.She reports that she has never smoked. She has never used smokeless tobacco. She reports that she does not drink alcohol and does not use drugs.    ROS Review of Systems  Objective:  BP 136/65   Pulse 72   Temp 97.8 F (36.6 C)   Ht 5' 1 (1.549 m)   Wt 156 lb 9.6 oz (71 kg)   SpO2 98%   BMI 29.59 kg/m   BP Readings from Last 3 Encounters:  09/25/23 136/65  09/19/23 (!) 152/60  05/29/23 (!) 145/77    Wt Readings from Last 3 Encounters:  09/25/23 156 lb 9.6 oz (71 kg)  09/19/23 154 lb 9.6 oz (70.1 kg)  05/29/23 153 lb (69.4 kg)     Physical Exam    Dusky toe with warmth. White heads noted as above. After sterile cleansing the lesion was nicked with an 11 blade scalpel with small amount of curdy exudate obtained. Small amount of blood followed no true pus.   Assessment &  Plan:  Edema of toe -     Uric acid  Abscess of toe of right foot  Other orders -     Ciprofloxacin  HCl; Take 1 tablet (250 mg total) by mouth 2 (two) times daily.  Dispense: 20 tablet; Refill: 0    Assessment and Plan Assessment & Plan Infection of toe with tophaceous deposits (possible gout)   The toe is infected with swelling and white bumps, likely due to uric acid deposits, suggestive of gout. The infection persists despite antibiotic treatment. Lance the infected area of the toe.  Traumatic injury to toe (recent battery drop)   A small weed eater battery was dropped on the toe, causing a traumatic injury. There is no significant pain or functional impairment.  Recording duration: 5  minutes       Follow-up: No follow-ups on file.  Butler Der, M.D.

## 2023-09-25 NOTE — Telephone Encounter (Signed)
 Copied from CRM [id number]. Topic: Clinical - Red Word Triage >> Sep 25, 2023  8:02 AM Donna BRAVO wrote: Red Word that prompted transfer to Nurse Triage: patients daughter Adrien calling, patient has right 2nd toe swollen, has white bump on the sides, looks like infection, very painful Reason for Disposition  Looks like a boil, infected sore, or deep ulcer  Answer Assessment - Initial Assessment Questions 1. ONSET: When did the pain start?      Adrien, daughter calling in.    She has been seen for this toe.   She was seen last Tues.   It looks like it's getting infected.   X rays were done but we have not heard anything.    It has a white spot and looks like it needs to be lanced.   There are 2 white bumps.   She is c/o pain.    2. LOCATION: Where is the pain located?   (e.g., around nail, entire toe, at foot joint)      Toe that she was seen for last week. 3. PAIN: How bad is the pain?    (Scale 1-10; or mild, moderate, severe)     Yes 4. APPEARANCE: What does the toe look like? (e.g., redness, swelling, bruising, pallor)     See above 5. CAUSE: What do you think is causing the toe pain?     Infection 6. OTHER SYMPTOMS: Do you have any other symptoms? (e.g., leg pain, rash, fever, numbness)     No drainage She is taking antibiotics    Only 2 left 7. PREGNANCY: Is there any chance you are pregnant? When was your last menstrual period?     N/A  Protocols used: Toe Pain-A-AH FYI Only or Action Required?: FYI only for provider.  Patient was last seen in primary care on 09/19/2023 by Deitra Morton Sebastian Nena, NP.  Called Nurse Triage reporting Toe Pain.  Symptoms began several weeks ago.  Interventions attempted: Prescription medications: antibiotic.  Symptoms are: rapidly worsening.  Triage Disposition: See Physician Within 24 Hours  Patient/caregiver understands and will follow disposition?: Yes

## 2023-09-26 ENCOUNTER — Ambulatory Visit: Payer: Self-pay | Admitting: Family Medicine

## 2023-09-26 LAB — URIC ACID: Uric Acid: 8.3 mg/dL — ABNORMAL HIGH (ref 3.1–7.9)

## 2023-09-26 MED ORDER — COLCHICINE 0.6 MG PO TABS: ORAL_TABLET | ORAL | 2 refills | Status: AC

## 2023-09-27 NOTE — Telephone Encounter (Signed)
 I called and spoke with patient and her daughter and gave them patients lab results. They voiced understanding and are aware that the [facility]  Rx has already been sent in for them and they can pick it up at [facility] pharmacy.

## 2023-11-16 ENCOUNTER — Ambulatory Visit (INDEPENDENT_AMBULATORY_CARE_PROVIDER_SITE_OTHER)

## 2023-11-16 VITALS — BP 136/65 | HR 72 | Ht 61.0 in | Wt 156.0 lb

## 2023-11-16 DIAGNOSIS — Z0000 Encounter for general adult medical examination without abnormal findings: Secondary | ICD-10-CM | POA: Diagnosis not present

## 2023-11-16 NOTE — Progress Notes (Signed)
 Subjective:   Janice Zamora is a 88 y.o. who presents for a Medicare Wellness preventive visit.  As a reminder, Annual Wellness Visits don't include a physical exam, and some assessments may be limited, especially if this visit is performed virtually. We may recommend an in-person follow-up visit with your provider if needed.  Visit Complete: Virtual I connected with  Janice Zamora on 11/16/23 by a audio enabled telemedicine application and verified that I am speaking with the correct person using two identifiers.  Patient Location: Home  Provider Location: Home Office  I discussed the limitations of evaluation and management by telemedicine. The patient expressed understanding and agreed to proceed.  Vital Signs: Because this visit was a virtual/telehealth visit, some criteria may be missing or patient reported. Any vitals not documented were not able to be obtained and vitals that have been documented are patient reported.  VideoDeclined- This patient declined Librarian, academic. Therefore the visit was completed with audio only.  Persons Participating in Visit: pt's [name] (daughter) per DPR  AWV Questionnaire: No: Patient Medicare AWV questionnaire was not completed prior to this visit.  Cardiac Risk Factors include: advanced age (>63men, >82 women);dyslipidemia;hypertension     Objective:    Today's Vitals   11/16/23 1010  BP: 136/65  Pulse: 72  Weight: 156 lb (70.8 kg)  Height: 5' 1 (1.549 m)   Body mass index is 29.48 kg/m.     11/16/2023   10:03 AM 01/17/2023    2:58 PM 09/13/2022    8:03 AM 01/14/2022    9:09 AM 01/13/2021    9:28 AM 01/13/2020    9:53 AM 08/10/2017    9:09 AM  Advanced Directives  Does Patient Have a Medical Advance Directive? No No Yes Yes Yes No No   Type of Best boy of [place];Living will Healthcare Power of [place];Living will Healthcare Power of [place];Living  will    Copy of Healthcare Power of Attorney in Chart?    No - copy requested No - copy requested    Would patient like information on creating a medical advance directive?  Yes (MAU/Ambulatory/Procedural Areas - Information given)    No - Patient declined Yes (MAU/Ambulatory/Procedural Areas - Information given)      Data saved with a previous flowsheet row definition    Current Medications (verified) Outpatient Encounter Medications as of 11/16/2023  Medication Sig   atenolol  (TENORMIN ) 50 MG tablet Take 1 tablet by mouth once daily   Blood Glucose Monitoring Suppl DEVI 1 each by Does not apply route in the morning, at noon, and at bedtime. May substitute to any manufacturer covered by patient's insurance.   ciprofloxacin  (CIPRO ) 250 MG tablet Take 1 tablet (250 mg total) by mouth 2 (two) times daily.   colchicine  0.6 MG tablet Take twice daily for gout attack. (may take every two hours up to 6 doses at acute onset)   diclofenac  Sodium (VOLTAREN ) 1 % GEL Apply 4 g topically 4 (four) times daily.   levocetirizine (XYZAL ) 5 MG tablet Take 1 tablet (5 mg total) by mouth every evening.   levothyroxine  (SYNTHROID ) 75 MCG tablet TAKE 1 TABLET BY MOUTH ONCE DAILY BEFORE BREAKFAST   lisinopril -hydrochlorothiazide  (ZESTORETIC ) 20-25 MG tablet Take 1 tablet by mouth daily.   LORazepam  (ATIVAN ) 0.5 MG tablet Take 1 tablet by mouth twice daily as needed for anxiety   Multiple Vitamin (MULTIVITAMIN WITH MINERALS) TABS tablet Take 1 tablet by mouth  daily.   omeprazole  (PRILOSEC) 20 MG capsule Take 1 capsule by mouth once daily   simvastatin  (ZOCOR ) 40 MG tablet TAKE 1 TABLET BY MOUTH AT BEDTIME   triamcinolone  cream (KENALOG ) 0.1 % Apply 1 application topically 2 (two) times daily.   No facility-administered encounter medications on file as of 11/16/2023.    Allergies (verified) Anesthesia [id number]-[id number] [propofol]   History: Past Medical History:  Diagnosis Date   Cataract    Cholelithiasis     Diverticulosis    Fibrocystic breast disease    GAD (generalized anxiety disorder)    GERD (gastroesophageal reflux disease)    Hyperlipidemia    Hypertension    Hypothyroidism    Metabolic syndrome    Osteopenia    Past Surgical History:  Procedure Laterality Date   BREAST BIOPSY Left    BREAST SURGERY     fibriotic cyst removed   CHOLECYSTECTOMY     CHOLECYSTECTOMY     right hand injection     Family History  Problem Relation Age of Onset   Hypertension Mother    Hypertension Father    Hyperlipidemia Sister    Hyperlipidemia Sister    Hyperlipidemia Sister    Breast cancer Daughter    Seizures Daughter    Healthy Daughter    Healthy Daughter    Hyperlipidemia Brother    Seizures Son    Healthy Son    Healthy Son    Social History   Socioeconomic History   Marital status: Widowed    Spouse name: Not on file   Number of children: 4   Years of education: 8   Highest education level: Not on file  Occupational History   Occupation: homemaker  Tobacco Use   Smoking status: Never   Smokeless tobacco: Never  Vaping Use   Vaping status: Never Used  Substance and Sexual Activity   Alcohol use: No   Drug use: No   Sexual activity: Not Currently    Birth control/protection: Post-menopausal  Other Topics Concern   Not on file  Social History Narrative   ** Merged History Encounter **       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 Maintains her own home and yard. Enjoys sewing (mask making).    Social Drivers of Corporate investment banker Strain: Low Risk  (11/16/2023)   Overall Financial Resource Strain (CARDIA)    Difficulty of Paying Living Expenses: Not hard at all  Food Insecurity: No Food Insecurity (11/16/2023)   Hunger Vital Sign    Worried About Running Out of Food in the Last Year: Never true    Ran Out of Food in the Last Year: Never true  Transportation Needs: No Transportation Needs (11/16/2023)   PRAPARE - Scientist, research (physical sciences) (Medical): No    Lack of Transportation (Non-Medical): No  Physical Activity: Inactive (11/16/2023)   Exercise Vital Sign    Days of Exercise per Week: 0 days    Minutes of Exercise per Session: 0 min  Stress: No Stress Concern Present (11/16/2023)   [employer] of Occupational Health - Occupational Stress Questionnaire    Feeling of Stress: Not at all  Social Connections: Moderately Isolated (11/16/2023)   Social Connection and Isolation Panel    Frequency of Communication with Friends and Family: More than three times a week    Frequency of Social Gatherings with Friends and Family: Three times a week    Attends Religious Services: 1 to 4 times  per year    Active Member of Clubs or Organizations: No    Attends Banker Meetings: Never    Marital Status: Widowed    Tobacco Counseling Counseling given: Yes    Clinical Intake:  Pre-visit preparation completed: Yes  Pain : No/denies pain     BMI - recorded: 29.48 Nutritional Status: BMI 25 -29 Overweight Nutritional Risks: None Diabetes: No  Lab Results  Component Value Date   HGBA1C 6.0 (H) 05/29/2023   HGBA1C 6.3 (H) 12/08/2022   HGBA1C 6.8 (H) 12/27/2021     How often do you need to have someone help you when you read instructions, pamphlets, or other written materials from your doctor or pharmacy?: 1 - Never  Interpreter Needed?: No  Comments: awv was done w/pt's daughter, [name] (daughter) Information entered by :: alia t/cma   Activities of Daily Living     11/16/2023   10:00 AM 01/17/2023    2:57 PM  In your present state of health, do you have any difficulty performing the following activities:  Hearing? 0 0  Vision? 0 0  Difficulty concentrating or making decisions? 1 0  Walking or climbing stairs? 0 1  Dressing or bathing? 0 0  Doing errands, shopping? 1 1  Comment pt's daughter   Quarry manager and eating ? N N  Using the Toilet? N N  In the past six  months, have you accidently leaked urine? Y N  Do you have problems with loss of bowel control? N N  Managing your Medications? CINDERELLA CINDERELLA  Comment pt's daughter   Managing your Finances? CINDERELLA CINDERELLA  Comment pt's daughter   Housekeeping or managing your Housekeeping? N N    Patient Care Team: [name], FNP as PCP - General (Nurse Practitioner) Abran Norleen SAILOR, MD as Consulting Physician (Gastroenterology) Nivia Sally Alice, OD as Consulting Physician (Optometry)  I have updated your Care Teams any recent Medical Services you may have received from other providers in the past year.     Assessment:   This is a routine wellness examination for Ha.  Hearing/Vision screen Hearing Screening - Comments:: Pt denies hearing dif Vision Screening - Comments:: Pt wear glasses/pt goes to [facility] in [place], [place]/last 2025   Goals Addressed             This Visit's Progress    Remain active and independent   On track      Depression Screen     11/16/2023   10:04 AM 09/25/2023    2:07 PM 05/29/2023   10:22 AM 01/17/2023    2:56 PM 11/28/2022    9:49 AM 05/30/2022   11:48 AM 01/14/2022    9:08 AM  PHQ 2/9 Scores  PHQ - 2 Score 0 0 0 0 0 0 0  PHQ- 9 Score 0 4  6 8 4  0    Fall Risk     11/16/2023    9:54 AM 09/25/2023    2:06 PM 05/29/2023   10:22 AM 01/17/2023    2:57 PM 11/28/2022    9:48 AM  Fall Risk   Falls in the past year? 1 0 1 1 1   Number falls in past yr: 1  1 1 1   Injury with Fall? 0  1 1 1   Risk for fall due to : Impaired balance/gait;Impaired mobility  History of fall(s) History of fall(s);Impaired balance/gait;Impaired mobility History of fall(s)  Follow up Falls evaluation completed;Education provided  Education provided Education provided;Falls  prevention discussed;Falls evaluation completed Falls prevention discussed    MEDICARE RISK AT HOME:  Medicare Risk at Home Any stairs in or around the home?: Yes If so, are there any without handrails?: Yes Home free of  loose throw rugs in walkways, pet beds, electrical cords, etc?: Yes Adequate lighting in your home to reduce risk of falls?: Yes Life alert?: No Use of a cane, walker or w/c?: No Grab bars in the bathroom?: Yes Shower chair or bench in shower?: Yes Elevated toilet seat or a handicapped toilet?: Yes  TIMED UP AND GO:  Was the test performed?  no  Cognitive Function: Declined/Normal: No cognitive concerns noted by patient or family. Patient alert, oriented, able to answer questions appropriately and recall recent events. No signs of memory loss or confusion.    08/10/2017    9:11 AM 10/28/2015    2:01 PM 10/20/2014   10:33 AM  MMSE - Mini Mental State Exam  Orientation to time 5 5  5    Orientation to Place 5 5  5    Registration 3 3  3    Attention/ Calculation 4 5    Recall 2 1  3    Language- name 2 objects 2 2  2    Language- repeat 1 1 1   Language- follow 3 step command 3 3  3    Language- read & follow direction 1 1  1    Write a sentence 1 1  1    Copy design 1 1  1    Total score 28 28       Data saved with a previous flowsheet row definition        01/17/2023    2:58 PM 01/14/2022    9:10 AM 01/13/2021    9:31 AM 01/13/2020    9:55 AM  6CIT Screen  What Year? 0 points 0 points 0 points 0 points  What month? 0 points 0 points 0 points 0 points  What time? 0 points 0 points 0 points 0 points  Count back from 20 0 points 0 points 0 points 0 points  Months in reverse 2 points 0 points 4 points 4 points  Repeat phrase 2 points 0 points 8 points 4 points  Total Score 4 points 0 points 12 points 8 points    Immunizations Immunization History  Administered Date(s) Administered   Fluad Quad(high Dose 65+) 12/11/2018, 12/12/2019, 12/27/2021   Fluad Trivalent(High Dose 65+) 11/28/2022   INFLUENZA, HIGH DOSE SEASONAL PF 11/16/2016, 12/11/2017   Influenza,inj,Quad PF,6+ Mos 11/02/2012, 10/29/2013, 11/11/2014, 10/22/2015   Influenza-Unspecified 10/29/2020   Moderna  Sars-Covid-2 Vaccination 03/08/2019, 04/09/2019, 01/29/2020   Pneumococcal Conjugate-13 09/08/2014   Pneumococcal Polysaccharide-23 06/01/2002   Tdap 09/14/2015    Screening Tests Health Maintenance  Topic Date Due   Zoster Vaccines- Shingrix (1 of 2) Never done   COVID-19 Vaccine (4 - 2025-26 season) 10/02/2023   Influenza Vaccine  04/30/2024 (Originally 09/01/2023)   DEXA SCAN  01/19/2024   Mammogram  06/18/2024   Medicare Annual Wellness (AWV)  11/15/2024   DTaP/Tdap/Td (2 - Td or Tdap) 09/13/2025   Pneumococcal Vaccine: 50+ Years  Completed   Meningococcal B Vaccine  Aged Out    Health Maintenance Items Addressed: See Nurse Notes at the end of this note  Additional Screening:  Vision Screening: Recommended annual ophthalmology exams for early detection of glaucoma and other disorders of the eye. Is the patient up to date with their annual eye exam?  Yes  Who is the provider or what  is the name of the office in which the patient attends annual eye exams? [facility] in [facility]  Dental Screening: Recommended annual dental exams for proper oral hygiene  Community Resource Referral / Chronic Care Management: CRR required this visit?  No   CCM required this visit?  No   Plan:    I have personally reviewed and noted the following in the patient's chart:   Medical and social history Use of alcohol, tobacco or illicit drugs  Current medications and supplements including opioid prescriptions. Patient is not currently taking opioid prescriptions. Functional ability and status Nutritional status Physical activity Advanced directives List of other physicians Hospitalizations, surgeries, and ER visits in previous 12 months Vitals Screenings to include cognitive, depression, and falls Referrals and appointments  In addition, I have reviewed and discussed with patient certain preventive protocols, quality metrics, and best practice recommendations. A written personalized  care plan for preventive services as well as general preventive health recommendations were provided to patient.   Ozie Ned, CMA   11/16/2023   After Visit Summary: (MyChart) Due to this being a telephonic visit, the after visit summary with patients personalized plan was offered to patient via MyChart   Notes: Nothing significant to report at this time.

## 2023-11-16 NOTE — Patient Instructions (Signed)
 Janice Zamora,  Thank you for taking the time for your Medicare Wellness Visit. I appreciate your continued commitment to your health goals. Please review the care plan we discussed, and feel free to reach out if I can assist you further.  Medicare recommends these wellness visits once per year to help you and your care team stay ahead of potential health issues. These visits are designed to focus on prevention, allowing your provider to concentrate on managing your acute and chronic conditions during your regular appointments.  Please note that Annual Wellness Visits do not include a physical exam. Some assessments may be limited, especially if the visit was conducted virtually. If needed, we may recommend a separate in-person follow-up with your provider.  Ongoing Care Seeing your primary care provider every 3 to 6 months helps us  monitor your health and provide consistent, personalized care.   Referrals If a referral was made during today's visit and you haven't received any updates within two weeks, please contact the referred provider directly to check on the status.  Recommended Screenings:  Health Maintenance  Topic Date Due   Zoster (Shingles) Vaccine (1 of 2) Never done   COVID-19 Vaccine (4 - 2025-26 season) 10/02/2023   Flu Shot  04/30/2024*   Medicare Annual Wellness Visit  01/17/2024   DEXA scan (bone density measurement)  01/19/2024   Breast Cancer Screening  06/18/2024   DTaP/Tdap/Td vaccine (2 - Td or Tdap) 09/13/2025   Pneumococcal Vaccine for age over 16  Completed   Meningitis B Vaccine  Aged Out  *Topic was postponed. The date shown is not the original due date.       11/16/2023   10:03 AM  Advanced Directives  Does Patient Have a Medical Advance Directive? No   Advance Care Planning is important because it: Ensures you receive medical care that aligns with your values, goals, and preferences. Provides guidance to your family and loved ones, reducing the  emotional burden of decision-making during critical moments.  Vision: Annual vision screenings are recommended for early detection of glaucoma, cataracts, and diabetic retinopathy. These exams can also reveal signs of chronic conditions such as diabetes and high blood pressure.  Dental: Annual dental screenings help detect early signs of oral cancer, gum disease, and other conditions linked to overall health, including heart disease and diabetes.  Please see the attached documents for additional preventive care recommendations.

## 2023-11-28 ENCOUNTER — Ambulatory Visit (INDEPENDENT_AMBULATORY_CARE_PROVIDER_SITE_OTHER): Admitting: Nurse Practitioner

## 2023-11-28 ENCOUNTER — Encounter: Payer: Self-pay | Admitting: Nurse Practitioner

## 2023-11-28 VITALS — BP 135/58 | HR 70 | Temp 97.6°F | Ht 61.0 in | Wt 154.0 lb

## 2023-11-28 DIAGNOSIS — H900 Conductive hearing loss, bilateral: Secondary | ICD-10-CM | POA: Diagnosis not present

## 2023-11-28 DIAGNOSIS — E034 Atrophy of thyroid (acquired): Secondary | ICD-10-CM

## 2023-11-28 DIAGNOSIS — I10 Essential (primary) hypertension: Secondary | ICD-10-CM

## 2023-11-28 DIAGNOSIS — Z23 Encounter for immunization: Secondary | ICD-10-CM

## 2023-11-28 DIAGNOSIS — W19XXXA Unspecified fall, initial encounter: Secondary | ICD-10-CM | POA: Diagnosis not present

## 2023-11-28 DIAGNOSIS — F411 Generalized anxiety disorder: Secondary | ICD-10-CM | POA: Diagnosis not present

## 2023-11-28 DIAGNOSIS — E782 Mixed hyperlipidemia: Secondary | ICD-10-CM

## 2023-11-28 DIAGNOSIS — M8588 Other specified disorders of bone density and structure, other site: Secondary | ICD-10-CM

## 2023-11-28 DIAGNOSIS — B001 Herpesviral vesicular dermatitis: Secondary | ICD-10-CM | POA: Diagnosis not present

## 2023-11-28 DIAGNOSIS — K219 Gastro-esophageal reflux disease without esophagitis: Secondary | ICD-10-CM

## 2023-11-28 DIAGNOSIS — R130 Aphagia: Secondary | ICD-10-CM

## 2023-11-28 DIAGNOSIS — E663 Overweight: Secondary | ICD-10-CM | POA: Diagnosis not present

## 2023-11-28 LAB — LIPID PANEL

## 2023-11-28 MED ORDER — VALACYCLOVIR HCL 1 G PO TABS: ORAL_TABLET | ORAL | 5 refills | Status: AC

## 2023-11-28 MED ORDER — OMEPRAZOLE 20 MG PO CPDR: 20.0000 mg | DELAYED_RELEASE_CAPSULE | Freq: Every day | ORAL | 1 refills | Status: AC

## 2023-11-28 MED ORDER — ATENOLOL 50 MG PO TABS: 50.0000 mg | ORAL_TABLET | Freq: Every day | ORAL | 1 refills | Status: AC

## 2023-11-28 MED ORDER — LISINOPRIL-HYDROCHLOROTHIAZIDE 20-25 MG PO TABS: 1.0000 | ORAL_TABLET | Freq: Every day | ORAL | 1 refills | Status: AC

## 2023-11-28 MED ORDER — SIMVASTATIN 40 MG PO TABS: 40.0000 mg | ORAL_TABLET | Freq: Every day | ORAL | 1 refills | Status: AC

## 2023-11-28 MED ORDER — LORAZEPAM 0.5 MG PO TABS: 0.5000 mg | ORAL_TABLET | Freq: Two times a day (BID) | ORAL | 2 refills | Status: DC | PRN

## 2023-11-28 MED ORDER — LEVOTHYROXINE SODIUM 75 MCG PO TABS: 75.0000 ug | ORAL_TABLET | Freq: Every day | ORAL | 1 refills | Status: AC

## 2023-11-28 NOTE — Patient Instructions (Signed)
Aphasia Aphasia is a language disorder. It affects the part of the brain that is used to communicate. Aphasia does not affect intelligence, but a person may have trouble: Speaking. Understanding speech. Reading. Writing. Some people with aphasia may also have trouble with memory or attention. Aphasia can happen to anyone at any age. It is most common in older adults. What are the causes? This condition is caused by damage to the language centers of the brain. Damage may be caused by: Stroke. This causes reduced blood flow to certain areas of the brain. Stroke is the most common cause of aphasia. Traumatic brain injury (TBI). Brain tumor. Infection of the brain tissues. Nervous system disease that gradually gets worse, such as dementia or multiple sclerosis (MS). This is called progressive neurological disorder. Brain surgery. What are the signs or symptoms? Symptoms of this condition include: Trouble finding the right words or expressing thoughts and needs through speech. Using the wrong words, nonsense words, or jargon. Talking in sentences that do not make sense or that are not grammatically correct. Being unable to repeat back words and phrases. Difficulty expressing ideas through writing or not understanding what you are reading. Being unable to understand other people's speech. Having trouble understanding numbers. The condition affects people differently. Symptoms may start suddenly or come on gradually, depending on the underlying cause. How is this diagnosed? This condition may be diagnosed based on a screening of your ability to communicate as soon as symptoms start, or when you are medically stable after a stroke or brain injury. Later, a more comprehensive assessment may be done in the hospital or at a rehabilitation center. The assessment may test your ability to: Use speech to communicate your needs. Use muscles in your mouth and throat for speaking and swallowing. Express  ideas with speech or other means of communication, such as hand gestures. Make conversation with others across a variety of topics. Hear and understand speech. Understand and produce written material. Manage memory and attention associated with communication. How is this treated? Treatment for this condition depends on your needs and abilities. The goal is to help restore your ability to communicate or find ways to manage communication challenges. Common treatments include: Speech-language therapy. Part of this may include: Rebuilding intonation, sentence structure, and vocabulary. Learning other ways to communicate, such as using word books, communication boards, or special software programs. Learning to communicate with writing, sign language, or hand gestures. Working with family members. This may include: Learning ways to communicate. Emotional support. Occupational therapy. This can help to find devices to assist with daily living. Treatment usually begins as soon as possible. It may begin while you are in the hospital and continue in a rehabilitation center or at home. In some cases, aphasia may improve quickly on its own. In other cases, recovery occurs more slowly over time. Follow these instructions at home:  Make sure you have a good support system at home. Find a support group. This can help you connect with others who are going through the same thing. Try the following tips while communicating: Use short, simple sentences. Ask family members to do the same. Sentences that require one-word or short answers are easiest. Avoid distractions like background noise when trying to listen or talk. Try communicating with gestures, pointing, writing, or drawing. Talk slowly. Ask family members to talk to you slowly. Maintain eye contact when communicating. Ask family members to do the same when communicating with you. Ask family members to give you time to  respond or to engage in  conversation with them. Keep all follow-up visits. Where to find support National Aphasia Association: www.aphasia.org Contact a health care provider if: Your symptoms change or get worse. You are struggling with anxiety or depression. Get help right away if: You have any symptoms of a stroke. "BE FAST" is an easy way to remember the main warning signs of a stroke: B - Balance. Signs are dizziness, sudden trouble walking, or loss of balance. E - Eyes. Signs are trouble seeing or a sudden change in vision. F - Face. Signs are sudden weakness or numbness of the face, or the face or eyelid drooping on one side. A - Arms. Signs are weakness or numbness in an arm. This happens suddenly and usually on one side of the body. S - Speech. Signs are sudden trouble speaking, slurred speech, or trouble understanding what people say. T - Time. Time to call emergency services. Write down what time symptoms started. You have other signs of a stroke, such as: A sudden, severe headache with no known cause. Nausea or vomiting. Seizure. These symptoms may be an emergency. Get help right away. Call 911. Do not wait to see if the symptoms will go away. Do not drive yourself to the hospital. Summary Aphasia is a language disorder. It may cause difficulty with speech, writing, reading, or understanding others. In some cases, aphasia may improve quickly on its own. In other cases, recovery occurs more slowly over time. Get help right away if you have symptoms of a stroke. This information is not intended to replace advice given to you by your health care provider. Make sure you discuss any questions you have with your health care provider. Document Revised: 02/26/2021 Document Reviewed: 02/26/2021 Elsevier Patient Education  2024 [employer].

## 2023-11-28 NOTE — Progress Notes (Signed)
 Subjective:    Patient ID: Janice Zamora, female    DOB: 1936-01-10, 88 y.o.   MRN: [id number]   Chief Complaint: medical management of chronic issues     HPI:  Janice Zamora is a 88 y.o. who identifies as a female who was assigned female at birth.   Social history: Lives with: by herself- her daughters check on her daily Work history: retired   Water Engineer in today for follow up of the following chronic medical issues:  1. Essential hypertension No c/o chest pain, sob or headache. Does not check blood pressure at home. BP Readings from Last 3 Encounters:  11/16/23 136/65  09/25/23 136/65  09/19/23 (!) 152/60     2. Mixed hyperlipidemia Does try to watch diet but does no exercise at all. Lab Results  Component Value Date   CHOL 157 05/29/2023   HDL 45 05/29/2023   LDLCALC 86 05/29/2023   TRIG 150 (H) 05/29/2023   CHOLHDL 3.5 05/29/2023     3. Metabolic syndrome Does not check blood sugars ar home. Lab Results  Component Value Date   HGBA1C 6.0 (H) 05/29/2023     4. Gastroesophageal reflux disease without esophagitis Is on omeprazole  daily and is doing well  5. Hypothyroidism due to acquired atrophy of thyroid  No issues that she is aware of. Lab Results  Component Value Date   TSH 0.947 11/28/2022     6. GAD (generalized anxiety disorder) Has ativan  that she takes as needed. Doe snot take daily.    11/28/2023   10:31 AM 05/29/2023   10:22 AM 11/28/2022    9:49 AM 05/30/2022   11:48 AM  GAD 7 : Generalized Anxiety Score  Nervous, Anxious, on Edge 0 0 0 0  Control/stop worrying 0 0 0 0  Worry too much - different things 0 0 0 0  Trouble relaxing 1 0 0 0  Restless 0 0 0 0  Easily annoyed or irritable 0 0 0 0  Afraid - awful might happen 0 0 0 0  Total GAD 7 Score 1 0 0 0  Anxiety Difficulty Not difficult at all Not difficult at all Not difficult at all Not difficult at all        7. Osteopenia of lumbar spine No weight bearing  exercises. Last dexascan was done on 01/18/22. Her t score was 0.3 which was normal  8. Overweight (BMI 25.0-29.9) No recent weight changes Wt Readings from Last 3 Encounters:  11/16/23 156 lb (70.8 kg)  09/25/23 156 lb 9.6 oz (71 kg)  09/19/23 154 lb 9.6 oz (70.1 kg)   BMI Readings from Last 3 Encounters:  11/16/23 29.48 kg/m  09/25/23 29.59 kg/m  09/19/23 29.21 kg/m      New complaints: Patient fell 3 weeks ago outside in her yard. Since then her left upper arm has been hurting.  Occassional difficulty with speech. Has seen neurologist- MRI of brain was negative. Having trouble with hearing- needs to see audiology  Allergies  Allergen Reactions   Anesthesia S-I-40 [Propofol] Nausea And Vomiting   Outpatient Encounter Medications as of 11/28/2023  Medication Sig   atenolol  (TENORMIN ) 50 MG tablet Take 1 tablet by mouth once daily   Blood Glucose Monitoring Suppl DEVI 1 each by Does not apply route in the morning, at noon, and at bedtime. May substitute to any manufacturer covered by patient's insurance.   ciprofloxacin  (CIPRO ) 250 MG tablet Take 1 tablet (250 mg total) by mouth 2 (two) times  daily.   colchicine  0.6 MG tablet Take twice daily for gout attack. (may take every two hours up to 6 doses at acute onset)   diclofenac  Sodium (VOLTAREN ) 1 % GEL Apply 4 g topically 4 (four) times daily.   levocetirizine (XYZAL ) 5 MG tablet Take 1 tablet (5 mg total) by mouth every evening.   levothyroxine  (SYNTHROID ) 75 MCG tablet TAKE 1 TABLET BY MOUTH ONCE DAILY BEFORE BREAKFAST   lisinopril -hydrochlorothiazide  (ZESTORETIC ) 20-25 MG tablet Take 1 tablet by mouth daily.   LORazepam  (ATIVAN ) 0.5 MG tablet Take 1 tablet by mouth twice daily as needed for anxiety   Multiple Vitamin (MULTIVITAMIN WITH MINERALS) TABS tablet Take 1 tablet by mouth daily.   omeprazole  (PRILOSEC) 20 MG capsule Take 1 capsule by mouth once daily   simvastatin  (ZOCOR ) 40 MG tablet TAKE 1 TABLET BY MOUTH AT  BEDTIME   triamcinolone  cream (KENALOG ) 0.1 % Apply 1 application topically 2 (two) times daily.   No facility-administered encounter medications on file as of 11/28/2023.    Past Surgical History:  Procedure Laterality Date   BREAST BIOPSY Left    BREAST SURGERY     fibriotic cyst removed   CHOLECYSTECTOMY     CHOLECYSTECTOMY     right hand injection      Family History  Problem Relation Age of Onset   Hypertension Mother    Hypertension Father    Hyperlipidemia Sister    Hyperlipidemia Sister    Hyperlipidemia Sister    Breast cancer Daughter    Seizures Daughter    Healthy Daughter    Healthy Daughter    Hyperlipidemia Brother    Seizures Son    Healthy Son    Healthy Son       Controlled substance contract: n/a     Review of Systems  Constitutional:  Negative for diaphoresis.  Eyes:  Negative for pain.  Respiratory:  Negative for shortness of breath.   Cardiovascular:  Negative for chest pain, palpitations and leg swelling.  Gastrointestinal:  Negative for abdominal pain.  Endocrine: Negative for polydipsia.  Skin:  Negative for rash.  Neurological:  Negative for dizziness, weakness and headaches.  Hematological:  Does not bruise/bleed easily.  All other systems reviewed and are negative.      Objective:   Physical Exam Vitals and nursing note reviewed.  Constitutional:      General: She is not in acute distress.    Appearance: Normal appearance. She is well-developed.  HENT:     Head: Normocephalic.     Right Ear: Tympanic membrane normal.     Left Ear: Tympanic membrane normal.     Nose: Nose normal.     Mouth/Throat:     Mouth: Mucous membranes are moist.  Eyes:     Pupils: Pupils are equal, round, and reactive to light.  Neck:     Vascular: No carotid bruit or JVD.  Cardiovascular:     Rate and Rhythm: Normal rate and regular rhythm.     Heart sounds: Normal heart sounds.  Pulmonary:     Effort: Pulmonary effort is normal. No  respiratory distress.     Breath sounds: Normal breath sounds. No wheezing or rales.  Chest:     Chest wall: No tenderness.  Abdominal:     General: Bowel sounds are normal. There is no distension or abdominal bruit.     Palpations: Abdomen is soft. There is no hepatomegaly, splenomegaly, mass or pulsatile mass.     Tenderness: There  is no abdominal tenderness.  Musculoskeletal:        General: Normal range of motion.     Cervical back: Normal range of motion and neck supple.  Lymphadenopathy:     Cervical: No cervical adenopathy.  Skin:    General: Skin is warm and dry.  Neurological:     Mental Status: She is alert and oriented to person, place, and time.     Deep Tendon Reflexes: Reflexes are normal and symmetric.  Psychiatric:        Behavior: Behavior normal.        Thought Content: Thought content normal.        Judgment: Judgment normal.     BP (!) 135/58   Pulse 70   Temp 97.6 F (36.4 C) (Temporal)   Ht 5' 1 (1.549 m)   Wt 154 lb (69.9 kg)   SpO2 99%   BMI 29.10 kg/m         Assessment & Plan:  Janice Zamora in today with chief complaint of Medical Management of Chronic Issues   1. Essential hypertension (Primary) Low sodium diet - atenolol  (TENORMIN ) 50 MG tablet; Take 1 tablet (50 mg total) by mouth daily.  Dispense: 90 tablet; Refill: 1 - lisinopril -hydrochlorothiazide  (ZESTORETIC ) 20-25 MG tablet; Take 1 tablet by mouth daily.  Dispense: 90 tablet; Refill: 1 - CBC with Differential/Platelet - CMP14+EGFR  2. Mixed hyperlipidemia Low fat diet - simvastatin  (ZOCOR ) 40 MG tablet; Take 1 tablet (40 mg total) by mouth at bedtime.  Dispense: 90 tablet; Refill: 1 - Lipid panel  3. Gastroesophageal reflux disease without esophagitis Avoid spicy foods Do not eat 2 hours prior to bedtime - omeprazole  (PRILOSEC) 20 MG capsule; Take 1 capsule (20 mg total) by mouth daily.  Dispense: 90 capsule; Refill: 1  4. Hypothyroidism due to acquired atrophy of  thyroid  Labs pending - levothyroxine  (SYNTHROID ) 75 MCG tablet; Take 1 tablet (75 mcg total) by mouth daily before breakfast.  Dispense: 90 tablet; Refill: 1  5. GAD (generalized anxiety disorder) Stress management - LORazepam  (ATIVAN ) 0.5 MG tablet; Take 1 tablet (0.5 mg total) by mouth 2 (two) times daily as needed. for anxiety  Dispense: 60 tablet; Refill: 2  6. Osteopenia of lumbar spine Weight bearing exercises  7. Overweight (BMI 25.0-29.9) Discussed diet and exercise for person with BMI >25 Will recheck weight in 3-6 months   8. Fall, initial encounter Fall prevention  9. Conductive hearing loss, bilateral Patient will call and make audiology appointment  10. aphasia Slow down when speaking Keep follow up with neurology    The above assessment and management plan was discussed with the patient. The patient verbalized understanding of and has agreed to the management plan. Patient is aware to call the clinic if symptoms persist or worsen. Patient is aware when to return to the clinic for a follow-up visit. Patient educated on when it is appropriate to go to the emergency department.   Mary-Margaret Gladis, FNP

## 2023-11-29 LAB — THYROID PANEL WITH TSH
Free Thyroxine Index: 3.1 (ref 1.2–4.9)
T3 Uptake Ratio: 31 % (ref 24–39)
T4, Total: 10.1 ug/dL (ref 4.5–12.0)
TSH: 0.479 u[IU]/mL (ref 0.450–4.500)

## 2023-11-29 LAB — CBC WITH DIFFERENTIAL/PLATELET
Basophils Absolute: 0.1 x10E3/uL (ref 0.0–0.2)
Basos: 1 %
EOS (ABSOLUTE): 0.1 x10E3/uL (ref 0.0–0.4)
Eos: 1 %
Hematocrit: 38 % (ref 34.0–46.6)
Hemoglobin: 12.5 g/dL (ref 11.1–15.9)
Immature Grans (Abs): 0.1 x10E3/uL (ref 0.0–0.1)
Immature Granulocytes: 1 %
Lymphocytes Absolute: 2 x10E3/uL (ref 0.7–3.1)
Lymphs: 26 %
MCH: 31.9 pg (ref 26.6–33.0)
MCHC: 32.9 g/dL (ref 31.5–35.7)
MCV: 97 fL (ref 79–97)
Monocytes Absolute: 0.7 x10E3/uL (ref 0.1–0.9)
Monocytes: 9 %
Neutrophils Absolute: 4.7 x10E3/uL (ref 1.4–7.0)
Neutrophils: 62 %
Platelets: 204 x10E3/uL (ref 150–450)
RBC: 3.92 x10E6/uL (ref 3.77–5.28)
RDW: 12.5 % (ref 11.7–15.4)
WBC: 7.5 x10E3/uL (ref 3.4–10.8)

## 2023-11-29 LAB — LIPID PANEL
Cholesterol, Total: 159 mg/dL (ref 100–199)
HDL: 39 mg/dL — AB (ref >39)
LDL CALC COMMENT:: 4.1 ratio (ref 0.0–4.4)
LDL Chol Calc (NIH): 90 mg/dL (ref 0–99)
Triglycerides: 176 mg/dL — AB (ref 0–149)
VLDL Cholesterol Cal: 30 mg/dL (ref 5–40)

## 2023-11-29 LAB — CMP14+EGFR
ALT: 17 IU/L (ref 0–32)
AST: 19 IU/L (ref 0–40)
Albumin: 4.7 g/dL (ref 3.7–4.7)
Alkaline Phosphatase: 71 IU/L (ref 48–129)
BUN/Creatinine Ratio: 25 (ref 12–28)
BUN: 31 mg/dL — AB (ref 8–27)
Bilirubin Total: 0.4 mg/dL (ref 0.0–1.2)
CO2: 22 mmol/L (ref 20–29)
Calcium: 10.2 mg/dL (ref 8.7–10.3)
Chloride: 100 mmol/L (ref 96–106)
Creatinine, Ser: 1.23 mg/dL — AB (ref 0.57–1.00)
Globulin, Total: 2.4 g/dL (ref 1.5–4.5)
Glucose: 127 mg/dL — AB (ref 70–99)
Potassium: 4.4 mmol/L (ref 3.5–5.2)
Sodium: 139 mmol/L (ref 134–144)
Total Protein: 7.1 g/dL (ref 6.0–8.5)
eGFR: 43 mL/min/1.73 — AB (ref >59)

## 2023-11-30 ENCOUNTER — Ambulatory Visit: Payer: Self-pay | Admitting: Nurse Practitioner

## 2024-03-03 ENCOUNTER — Other Ambulatory Visit: Payer: Self-pay | Admitting: Nurse Practitioner

## 2024-03-03 DIAGNOSIS — F411 Generalized anxiety disorder: Secondary | ICD-10-CM

## 2024-05-27 ENCOUNTER — Ambulatory Visit: Payer: Self-pay | Admitting: Nurse Practitioner

## 2024-11-18 ENCOUNTER — Ambulatory Visit
